# Patient Record
Sex: Female | Born: 1970
Health system: Southern US, Community
[De-identification: ages and names within clinical notes are randomized; demographics above are authoritative.]

## PROBLEM LIST (undated history)

## (undated) DIAGNOSIS — Z8616 Personal history of COVID-19: Secondary | ICD-10-CM

## (undated) DIAGNOSIS — D649 Anemia, unspecified: Secondary | ICD-10-CM

## (undated) DIAGNOSIS — E119 Type 2 diabetes mellitus without complications: Secondary | ICD-10-CM

## (undated) DIAGNOSIS — K5900 Constipation, unspecified: Secondary | ICD-10-CM

## (undated) DIAGNOSIS — K648 Other hemorrhoids: Secondary | ICD-10-CM

## (undated) DIAGNOSIS — E782 Mixed hyperlipidemia: Secondary | ICD-10-CM

## (undated) DIAGNOSIS — I10 Essential (primary) hypertension: Secondary | ICD-10-CM

## (undated) DIAGNOSIS — Z5189 Encounter for other specified aftercare: Secondary | ICD-10-CM

## (undated) DIAGNOSIS — R5383 Other fatigue: Secondary | ICD-10-CM

## (undated) DIAGNOSIS — K219 Gastro-esophageal reflux disease without esophagitis: Secondary | ICD-10-CM

## (undated) HISTORY — DX: Type 2 diabetes mellitus without complications: E11.9

## (undated) HISTORY — DX: Encounter for other specified aftercare: Z51.89

## (undated) HISTORY — DX: Essential (primary) hypertension: I10

## (undated) HISTORY — PX: COLONOSCOPY: SHX174

---

## 2002-09-19 ENCOUNTER — Encounter (INDEPENDENT_AMBULATORY_CARE_PROVIDER_SITE_OTHER): Payer: Self-pay | Admitting: *Deleted

## 2002-09-19 ENCOUNTER — Encounter: Admission: RE | Admit: 2002-09-19 | Discharge: 2002-09-19 | Payer: Self-pay | Admitting: Internal Medicine

## 2002-09-19 ENCOUNTER — Encounter: Payer: Self-pay | Admitting: Internal Medicine

## 2004-01-15 ENCOUNTER — Inpatient Hospital Stay (HOSPITAL_COMMUNITY): Admission: AD | Admit: 2004-01-15 | Discharge: 2004-01-15 | Payer: Self-pay | Admitting: Obstetrics

## 2004-01-16 ENCOUNTER — Inpatient Hospital Stay (HOSPITAL_COMMUNITY): Admission: AD | Admit: 2004-01-16 | Discharge: 2004-01-19 | Payer: Self-pay | Admitting: Obstetrics

## 2006-10-20 ENCOUNTER — Encounter: Admission: RE | Admit: 2006-10-20 | Discharge: 2006-10-20 | Payer: Self-pay | Admitting: Internal Medicine

## 2007-09-08 ENCOUNTER — Emergency Department (HOSPITAL_COMMUNITY): Admission: EM | Admit: 2007-09-08 | Discharge: 2007-09-08 | Payer: Self-pay | Admitting: Emergency Medicine

## 2007-10-01 ENCOUNTER — Encounter: Admission: RE | Admit: 2007-10-01 | Discharge: 2007-10-01 | Payer: Self-pay | Admitting: Internal Medicine

## 2007-10-01 ENCOUNTER — Encounter (INDEPENDENT_AMBULATORY_CARE_PROVIDER_SITE_OTHER): Payer: Self-pay | Admitting: *Deleted

## 2010-05-25 ENCOUNTER — Encounter (INDEPENDENT_AMBULATORY_CARE_PROVIDER_SITE_OTHER): Payer: Self-pay | Admitting: *Deleted

## 2010-05-27 ENCOUNTER — Telehealth: Payer: Self-pay | Admitting: Gastroenterology

## 2010-05-28 ENCOUNTER — Ambulatory Visit: Payer: Self-pay | Admitting: Gastroenterology

## 2010-05-28 ENCOUNTER — Encounter (INDEPENDENT_AMBULATORY_CARE_PROVIDER_SITE_OTHER): Payer: Self-pay | Admitting: *Deleted

## 2010-05-28 DIAGNOSIS — K5900 Constipation, unspecified: Secondary | ICD-10-CM | POA: Insufficient documentation

## 2010-05-28 DIAGNOSIS — K625 Hemorrhage of anus and rectum: Secondary | ICD-10-CM | POA: Insufficient documentation

## 2010-05-28 DIAGNOSIS — K219 Gastro-esophageal reflux disease without esophagitis: Secondary | ICD-10-CM | POA: Insufficient documentation

## 2010-05-28 HISTORY — DX: Hemorrhage of anus and rectum: K62.5

## 2010-05-28 LAB — CONVERTED CEMR LAB
ALT: 24 units/L (ref 0–35)
AST: 23 units/L (ref 0–37)
Albumin: 4.3 g/dL (ref 3.5–5.2)
Alkaline Phosphatase: 98 units/L (ref 39–117)
BUN: 9 mg/dL (ref 6–23)
Basophils Absolute: 0 10*3/uL (ref 0.0–0.1)
Basophils Relative: 0.5 % (ref 0.0–3.0)
Bilirubin, Direct: 0.1 mg/dL (ref 0.0–0.3)
CO2: 32 meq/L (ref 19–32)
Calcium: 9.8 mg/dL (ref 8.4–10.5)
Chloride: 102 meq/L (ref 96–112)
Creatinine, Ser: 0.7 mg/dL (ref 0.4–1.2)
Eosinophils Absolute: 0.1 10*3/uL (ref 0.0–0.7)
Eosinophils Relative: 1.5 % (ref 0.0–5.0)
Ferritin: 87 ng/mL (ref 10.0–291.0)
Folate: 8.7 ng/mL
GFR calc non Af Amer: 100.4 mL/min (ref >60)
Glucose, Bld: 83 mg/dL (ref 70–99)
HCT: 39.3 % (ref 36.0–46.0)
Hemoglobin: 13.2 g/dL (ref 12.0–15.0)
Iron: 82 ug/dL (ref 42–145)
Lymphocytes Relative: 30.1 % (ref 12.0–46.0)
Lymphs Abs: 2.1 10*3/uL (ref 0.7–4.0)
MCHC: 33.6 g/dL (ref 30.0–36.0)
MCV: 78.3 fL (ref 78.0–100.0)
Monocytes Absolute: 0.6 10*3/uL (ref 0.1–1.0)
Monocytes Relative: 8.4 % (ref 3.0–12.0)
Neutro Abs: 4.1 10*3/uL (ref 1.4–7.7)
Neutrophils Relative %: 59.5 % (ref 43.0–77.0)
Platelets: 302 10*3/uL (ref 150.0–400.0)
Potassium: 4.5 meq/L (ref 3.5–5.1)
RBC: 5.03 M/uL (ref 3.87–5.11)
RDW: 12.7 % (ref 11.5–14.6)
Saturation Ratios: 21.6 % (ref 20.0–50.0)
Sodium: 141 meq/L (ref 135–145)
TSH: 1.17 microintl units/mL (ref 0.35–5.50)
Total Bilirubin: 0.3 mg/dL (ref 0.3–1.2)
Total Protein: 8.1 g/dL (ref 6.0–8.3)
Transferrin: 271.1 mg/dL (ref 212.0–360.0)
Vitamin B-12: 418 pg/mL (ref 211–911)
WBC: 6.9 10*3/uL (ref 4.5–10.5)

## 2010-06-03 ENCOUNTER — Telehealth (INDEPENDENT_AMBULATORY_CARE_PROVIDER_SITE_OTHER): Payer: Self-pay | Admitting: *Deleted

## 2010-06-04 ENCOUNTER — Ambulatory Visit: Payer: Self-pay | Admitting: Gastroenterology

## 2010-06-07 ENCOUNTER — Telehealth: Payer: Self-pay | Admitting: Gastroenterology

## 2010-08-08 ENCOUNTER — Encounter: Payer: Self-pay | Admitting: Obstetrics

## 2010-08-17 NOTE — Progress Notes (Signed)
Summary: triage  Phone Note Call from Patient Call back at Home Phone [phone]   Caller: Patient Call For: Dr. Jarold Motto Reason for Call: Talk to Nurse Details for Reason: triage Summary of Call: Pt. called and is still bleeding.  Please call and advise. Initial call taken by: Schuyler Amor,  June 07, 2010 3:09 PM  Follow-up for Phone Call        Patient  says that his wife is still having rectal bleeding.  She is advised to continue analpram and sitz bath two times a day.  She will call back for continued problems after 2 weeks. Follow-up by: Darcey Nora RN, CGRN,  June 07, 2010 3:33 PM  Additional Follow-up for Phone Call Additional follow up Details #1::        surgical referral probably needed per large bleeding external hemorrhoids... Additional Follow-up by: Mardella Layman MD FACG,  June 07, 2010 3:54 PM  New Problems: HEMORRHOIDS (ICD-455.6)   Additional Follow-up for Phone Call Additional follow up Details #2::    NOT AN EMERGENCY...WOULD SCHEDULE ELECTIVE APPT...DRPDo you want me to refer now or wait to see how she responds to tx? Follow-up by: Darcey Nora RN, CGRN,  June 07, 2010 4:06 PM  Additional Follow-up for Phone Call Additional follow up Details #3:: Details for Additional Follow-up Action Taken: SCHEDULE ELECTIVE APPT////I DOUBT SHE WILL STOP.Marland KitchenMarland KitchenDEMANDING PTS. Additional Follow-up by: Mardella Layman MD FACG,  June 07, 2010 4:12 PM  New Problems: HEMORRHOIDS (ICD-455.6)  Patient's husband notified of surgical referral to [facility] Dr Luisa Hart 06/22/10 11:00. Darcey Nora RN, [facility]  June 07, 2010 4:50 PM

## 2010-08-17 NOTE — Assessment & Plan Note (Signed)
Summary: rectal bleeding...as.   History of Present Illness Visit Type: Initial Visit Primary GI MD: Sheryn Bison MD FACP FAGA Primary Shuntel Fishburn: Rowland Lathe, PA Chief Complaint: rectal bleeding  x 1 week, very heavy  History of Present Illness:   40 year old [place] female patient of Dr. Glendale Chard referred for a one-week history of increasing constipation, abdominal gas and bloating, and bright red blood per rectum. Apparently patient been constipated for several years. Her history is supplied by a very intelligent interpreter supplied by [employer].  History is difficult, but apparently patient has not had previous endoscopic exams. She has some minor reflux symptoms and recently has been placed on Dexilant 60 mg a day, p.r.n. MiraLax, and Anusol-HC suppositories. Currently, she denies chest pain, dysphagia, or any specific hepatobiliary Sherral Hammers or any family history of colon cancer  or polyps.   GI Review of Systems    Reports abdominal pain.     Location of  Abdominal pain: generalized.    Denies acid reflux, belching, bloating, chest pain, dysphagia with liquids, dysphagia with solids, heartburn, loss of appetite, nausea, vomiting, vomiting blood, weight loss, and  weight gain.      Reports constipation and  rectal bleeding.     Denies anal fissure, black tarry stools, change in bowel habit, diarrhea, diverticulosis, fecal incontinence, heme positive stool, hemorrhoids, irritable bowel syndrome, jaundice, light color stool, liver problems, and  rectal pain. Preventive Screening-Counseling & Management  Alcohol-Tobacco     Smoking Status: never      Drug Use:  no.      Current Medications (verified): 1)  Miralax  Powd (Polyethylene Glycol 3350) .... As Directed 2)  Anusol-Hc 25 Mg Supp (Hydrocortisone Acetate) .... As Needed 3)  Dexilant 60 Mg Cpdr (Dexlansoprazole) .... Take 1 Capsule By Mouth Once Daily 4)  Loratadine 10 Mg Tabs (Loratadine) .... Take 1 Tablet By Mouth  Once Daily  Allergies (verified): No Known Drug Allergies  Past History:  Past medical, surgical, family and social histories (including risk factors) reviewed for relevance to current acute and chronic problems.  Past Medical History: Arthritis Esophageal Stricture GI Bleed  Past Surgical History: Unremarkable  Family History: Reviewed history and no changes required. No FH of Colon Cancer:  Social History: Reviewed history and no changes required. Patient has never smoked.  Alcohol Use - no Illicit Drug Use - no Smoking Status:  never Drug Use:  no  Review of Systems       The patient complains of arthritis/joint pain, confusion, itching, and sore throat.  The patient denies allergy/sinus, anemia, anxiety-new, back pain, blood in urine, breast changes/lumps, change in vision, cough, coughing up blood, depression-new, fainting, fatigue, fever, headaches-new, hearing problems, heart murmur, heart rhythm changes, menstrual pain, muscle pains/cramps, night sweats, nosebleeds, pregnancy symptoms, shortness of breath, skin rash, sleeping problems, swelling of feet/legs, swollen lymph glands, thirst - excessive , urination - excessive , urination changes/pain, urine leakage, vision changes, and voice change.    Vital Signs:  Patient profile:   40 year old female Height:      62 inches Weight:      136.38 pounds BMI:     25.03 Pulse rate:   64 / minute Pulse rhythm:   regular BP sitting:   152 / 90  (right arm) Cuff size:   regular  Vitals Entered By: June McMurray CMA Duncan Dull) (May 28, 2010 8:30 AM)  Physical Exam  General:  Well developed, well nourished, no acute distress.healthy appearing.  Head:  Normocephalic and atraumatic. Eyes:  PERRLA, no icterus. Lungs:  Clear throughout to auscultation. Heart:  Regular rate and rhythm; no murmurs, rubs,  or bruits. Abdomen:  Soft, nontender and nondistended. No masses, hepatosplenomegaly or hernias noted. Normal bowel  sounds.No distention, masses or tenderness or abnormal bowel sounds noted. Rectal:  inspection of the rectum is unremarkable as is digital rectal exam. Rectal tone appears normal. There is bloody solid stool noted with straining. Extremities:  No clubbing, cyanosis, edema or deformities noted. Neurologic:  Alert and  oriented x4;  grossly normal neurologically. Skin:  Intact without significant lesions or rashes. Psych:  Alert and cooperative. Normal mood and affect.   Impression & Recommendations:  Problem # 1:  RECTAL BLEEDING (ICD-569.3) Assessment Unchanged Probable colonic polyposis or carcinoma versus stercoral ulceration associated with severe constipation. I have ordered appropriate labs and schedule her for colonoscopy exam. We will try to coordinate this with her family and with help of her interpreter. Orders: Colonoscopy (Colon) TLB-CBC Platelet - w/Differential (85025-CBCD) TLB-BMP (Basic Metabolic Panel-BMET) (80048-METABOL) TLB-Hepatic/Liver Function Pnl (80076-HEPATIC) TLB-TSH (Thyroid Stimulating Hormone) (84443-TSH) TLB-B12, Serum-Total ONLY ([address]-U04) TLB-Ferritin (82728-FER) TLB-Folic Acid (Folate) (82746-FOL) TLB-IBC Pnl (Iron/FE;Transferrin) (83550-IBC)  Problem # 2:  CONSTIPATION (ICD-564.00) Assessment: Deteriorated Daily Anzemet MiraLax at bedtime with followup Amitiza 8 micrograms twice a day.  Problem # 3:  GERD (ICD-530.81) Assessment: Improved I cannot elicit any significant upper GI symptoms at this time, and we'll empirically have her continue her Dexilant 60 mg a day.  Patient Instructions: 1)  Copy sent to : Rowland Lathe, PA 2)  Please go to the basement today for your labs.  3)  [facility] Patient Information Guide given to patient.  4)  Colonoscopy and Flexible Sigmoidoscopy brochure given.  5)  Your prescription(s) have been sent to you pharmacy.  6)  Your procedure has been scheduled for 06/04/2010, please follow the  seperate instructions.  7)  The medication list was reviewed and reconciled.  All changed / newly prescribed medications were explained.  A complete medication list was provided to the patient / caregiver. Prescriptions: MOVIPREP 100 GM  SOLR (PEG-KCL-NACL-NASULF-NA ASC-C) As per prep instructions.  #1 x 0   Entered by:   Harlow Mares CMA (AAMA)   Authorized by:   Mardella Layman MD [facility]   Signed by:   Harlow Mares CMA (AAMA) on 05/28/2010   Method used:   [facility] [address] [phone]* (retail)       [address]       [place], [place]  [address]       Ph: [phone] or [phone]       Fax: [phone]   RxID:   [id number]

## 2010-08-17 NOTE — Progress Notes (Signed)
Summary: Triage  Phone Note Other Incoming Call back at [phone]   Caller: Patient's Husband, Nia Details for Reason: triage Summary of Call: Pt. has appt. with Dr. Jarold Motto 12/13.  Husband called back today to see if she could be seen sooner as she is having a lot of bleeding.   Initial call taken by: Schuyler Amor,  May 27, 2010 2:16 PM  Follow-up for Phone Call        Left message for patient to call back Darcey Nora RN, [facility]  May 27, 2010 2:35 PM  I spoke with the patient's husband.  She is having rectal bleeding.  Wanted to move up her appointment.  Patient's husband is very difficult to understand English is very poor and broken.  I have rescheduled the patient for an appointment tomorrow with Dr Jarold Motto I have also arranged with Rashanda at [facility] for a translator patient speeks no English. Follow-up by: Darcey Nora RN, CGRN,  May 27, 2010 3:05 PM

## 2010-08-17 NOTE — Miscellaneous (Signed)
Summary: prescriptions  Clinical Lists Changes  Medications: Removed medication of MOVIPREP 100 GM  SOLR (PEG-KCL-NACL-NASULF-NA ASC-C) As per prep instructions. Added new medication of ANALPRAM-HC 1-2.5 % CREA (HYDROCORTISONE ACE-PRAMOXINE) apply to afftected area as needed - Signed Rx of AMITIZA 8 MCG CAPS (LUBIPROSTONE) take one by mouth two times a day;  #60 x 6;  Signed;  Entered by: Francee Piccolo CMA (AAMA);  Authorized by: Mardella Layman MD [facility];  Method used: [facility] [address] [phone]*, [address] [place], [place], [place]  [address], Ph: [phone] or [phone], Fax: [phone] Rx of ANALPRAM-HC 1-2.5 % CREA (HYDROCORTISONE ACE-PRAMOXINE) apply to afftected area as needed;  #30 g x 3;  Signed;  Entered by: Francee Piccolo CMA (AAMA);  Authorized by: Mardella Layman MD [facility];  Method used: Electronically to [facility] [address] [phone]*, [address], [place], [place], [place]  [address], Ph: [phone] or [phone], Fax: [phone]    Prescriptions: ANALPRAM-HC 1-2.5 % CREA (HYDROCORTISONE ACE-PRAMOXINE) apply to afftected area as needed  #30 g x 3   Entered by:   Francee Piccolo CMA (AAMA)   Authorized by:   Mardella Layman MD [facility]   Signed by:   Francee Piccolo CMA (AAMA) on 06/04/2010   Method used:   Electronically to        [facility] [address] [phone]* (retail)       [address]       [place], [place]  [address]       Ph: [phone] or [phone]       Fax: [phone]   RxID:   [id number] AMITIZA 8 MCG CAPS (LUBIPROSTONE) take one by mouth two times a day  #60 x 6   Entered by:   Francee Piccolo CMA (AAMA)   Authorized by:   Mardella Layman MD [facility]   Signed by:   Francee Piccolo CMA (AAMA) on 06/04/2010   Method used:   Electronically to        [facility] [address] [phone]* (retail)       [address]       [place], [place]   [address]       Ph: [phone] or [phone]       Fax: [phone]   RxID:   [id number]

## 2010-08-17 NOTE — Letter (Signed)
Summary: New Patient letter  [facility]  [address] [place], [place] [address]   Phone: [phone]  Fax: [phone]       05/25/2010 MRN: [id number]  [facility] [address] [place], [place]  [address]  Dear Lori Sullivan,  Welcome to the Gastroenterology Division at [facility].    You are scheduled to see Dr. Jarold Motto on 06/29/2010 at 2:00PM on the 3rd floor at [facility], [address]  We ask that you try to arrive at our office 15 minutes prior to your appointment time to allow for check-in.  We would like you to complete the enclosed self-administered evaluation form prior to your visit and bring it with you on the day of your appointment.  We will review it with you.  Also, please bring a complete list of all your medications or, if you prefer, bring the medication bottles and we will list them.  Please bring your insurance card so that we may make a copy of it.  If your insurance requires a referral to see a specialist, please bring your referral form from your primary care physician.  Co-payments are due at the time of your visit and may be paid by cash, check or credit card.     Your office visit will consist of a consult with your physician (includes a physical exam), any laboratory testing he/she may order, scheduling of any necessary diagnostic testing (e.g. x-ray, ultrasound, CT-scan), and scheduling of a procedure (e.g. Endoscopy, Colonoscopy) if required.  Please allow enough time on your schedule to allow for any/all of these possibilities.    If you cannot keep your appointment, please call [phone] to cancel or reschedule prior to your appointment date.  This allows [place] the opportunity to schedule an appointment for another patient in need of care.  If you do not cancel or reschedule by 5 p.m. the business day prior to your appointment date, you will be charged a $50.00 late cancellation/no-show fee.    Thank you for  choosing [facility] for your medical needs.  We appreciate the opportunity to care for you.  Please visit [place] at our website  to learn more about our practice.                     Sincerely,                                                             The Gastroenterology Division

## 2010-08-17 NOTE — Procedures (Signed)
Summary: Colonoscopy  Patient: [name] Note: All result statuses are Final unless otherwise noted.  Tests: (1) Colonoscopy (COL)   COL Colonoscopy           DONE     [facility]     [address]     [place], [place]  [address]           COLONOSCOPY PROCEDURE REPORT           PATIENT:  Lori Sullivan, Lori Sullivan  MR#:  [id number]     BIRTHDATE:  07-12-1971, 39 yrs. old  GENDER:  female     ENDOSCOPIST:  Vania Rea. Jarold Motto, MD, [facility]     REF. BY:     PROCEDURE DATE:  06/04/2010     PROCEDURE:  Diagnostic Colonoscopy     ASA CLASS:  Class II     INDICATIONS:  constipation, hematochezia     MEDICATIONS:   Fentanyl 100 mcg IV, Versed 10 mg IV           DESCRIPTION OF PROCEDURE:   After the risks benefits and     alternatives of the procedure were thoroughly explained, informed     consent was obtained.  Digital rectal exam was performed and     revealed no abnormalities.   The LB 180AL [id number] endoscope was     introduced through the anus and advanced to the cecum, which was     identified by both the appendix and ileocecal valve, without     limitations.  The quality of the prep was excellent, using     MoviPrep.  The instrument was then slowly withdrawn as the colon     was fully examined.     <<PROCEDUREIMAGES>>           FINDINGS:  no active bleeding or blood in c.  No polyps or cancers     were seen.  External hemorrhoids were found. LARGE AND BULGING     EXTERNAL HEMORRHOIDS.SEE PICTURES   Retroflexed views in the     rectum revealed it was not tolerated by the patient.    The scope     was then withdrawn from the patient and the procedure completed.           COMPLICATIONS:  None     ENDOSCOPIC IMPRESSION:     1) No active bleeding or blood in c     2) No polyps or cancers     3) External hemorrhoids     CHRONIC FUNCTIONAL CONSTIPATION AHD HEMORRHOIDAL BLEEDING.     RECOMMENDATIONS:     1,QHS MIRALAX 8OZS     2,AMITIZA BID     3,ANALPRAM CREAM BID     REPEAT  EXAM:  No           ______________________________     Vania Rea. Jarold Motto, MD, Clement[facility] Graham           CC:           n.     eSIGNED:   Vania Rea. Srihith Aquilino at 06/04/2010 03:40 PM           [name], [name], [id number]  Note: An exclamation mark (!) indicates a result that was not dispersed into the flowsheet. Document Creation Date: 06/04/2010 3:40 PM _______________________________________________________________________  (1) Order result status: Final Collection or observation date-time: 06/04/2010 15:29 Requested date-time:  Receipt date-time:  Reported date-time:  Referring Physician:   Ordering Physician: Sheryn Bison [phone]) Specimen Source:  Source: Launa Grill Order Number: [id number] Lab site:

## 2010-08-17 NOTE — Letter (Signed)
Summary: [facility] Instructions  [facility]  [address] [place], [place] [address]   Phone: [phone]  Fax: [phone]       Sania Rexene [facility]    April 04, 1971    MRN: [id number]        Procedure Day /Date: 06/04/2010 Friday     Arrival Time: 2:00pm     Procedure Time: 3:00pm     Location of Procedure:                    X  [facility] (4th Floor)                        PREPARATION FOR COLONOSCOPY WITH MOVIPREP   Starting 5 days prior to your procedure 05/30/2010 do not eat nuts, seeds, popcorn, corn, beans, peas,  salads, or any raw vegetables.  Do not take any fiber supplements (e.g. Metamucil, Citrucel, and Benefiber).  THE DAY BEFORE YOUR PROCEDURE         DATE: 06/03/2010  DAY: Thursday  1.  Drink clear liquids the entire day-NO SOLID FOOD  2.  Do not drink anything colored red or purple.  Avoid juices with pulp.  No orange juice.  3.  Drink at least 64 oz. (8 glasses) of fluid/clear liquids during the day to prevent dehydration and help the prep work efficiently.  CLEAR LIQUIDS INCLUDE: Water Jello Ice Popsicles Tea (sugar ok, no milk/cream) Powdered fruit flavored drinks Coffee (sugar ok, no milk/cream) Gatorade Juice: apple, white grape, white cranberry  Lemonade Clear bullion, consomm, broth Carbonated beverages (any kind) Strained chicken noodle soup Hard Candy                             4.  In the morning, mix first dose of MoviPrep solution:    Empty 1 Pouch A and 1 Pouch B into the disposable container    Add lukewarm drinking water to the top line of the container. Mix to dissolve    Refrigerate (mixed solution should be used within 24 hrs)  5.  Begin drinking the prep at 5:00 p.m. The MoviPrep container is divided by 4 marks.   Every 15 minutes drink the solution down to the next mark (approximately 8 oz) until the full liter is complete.   6.  Follow completed prep with 16 oz of clear liquid of your choice (Nothing  red or purple).  Continue to drink clear liquids until bedtime.  7.  Before going to bed, mix second dose of MoviPrep solution:    Empty 1 Pouch A and 1 Pouch B into the disposable container    Add lukewarm drinking water to the top line of the container. Mix to dissolve    Refrigerate  THE DAY OF YOUR PROCEDURE      DATE: 06/04/2010   DAY: Friday  Beginning at 10:00am (5 hours before procedure):         1. Every 15 minutes, drink the solution down to the next mark (approx 8 oz) until the full liter is complete.  2. Follow completed prep with 16 oz. of clear liquid of your choice.    3. You may drink clear liquids until 1:00pm (2 HOURS BEFORE PROCEDURE).   MEDICATION INSTRUCTIONS  Unless otherwise instructed, you should take regular prescription medications with a small sip of water   as early as possible the morning of your procedure.  OTHER INSTRUCTIONS  You will need a responsible adult at least 40 years of age to accompany you and drive you home.   This person must remain in the waiting room during your procedure.  Wear loose fitting clothing that is easily removed.  Leave jewelry and other valuables at home.  However, you may wish to bring a book to read or  an iPod/MP3 player to listen to music as you wait for your procedure to start.  Remove all body piercing jewelry and leave at home.  Total time from sign-in until discharge is approximately 2-3 hours.  You should go home directly after your procedure and rest.  You can resume normal activities the  day after your procedure.  The day of your procedure you should not:   Drive   Make legal decisions   Operate machinery   Drink alcohol   Return to work  You will receive specific instructions about eating, activities and medications before you leave.    The above instructions have been reviewed and explained to me by   _______________________    I fully understand and can verbalize these  instructions _____________________________ Date _________

## 2010-08-17 NOTE — Progress Notes (Signed)
Summary: Translator for [facility]  ---- Converted from flag ---- ---- 05/28/2010 10:11 AM, Harlow Mares CMA (AAMA) wrote: call Darl Householder [phone] to make sure he has a Nurse, learning disability. ------------------------------  Phone Note Outgoing Call   Call placed by: Harlow Mares CMA Duncan Dull),  June 03, 2010 9:29 AM Call placed to: Rashanda Summary of Call: called to make sure that the translator will be at the [facility] tomorrow arriving at 2pm for the patients procedure. Darl Householder states that it is arranged and he will be there.  Initial call taken by: Harlow Mares CMA (AAMA),  June 03, 2010 9:30 AM

## 2010-12-03 NOTE — Op Note (Signed)
NAME:  [facility], Makenzye                               ACCOUNT NO.:  [id number]   MEDICAL RECORD NO.:  [id number]                   PATIENT TYPE:  INP   LOCATION:  [address]                                 FACILITY:  [facility]   PHYSICIAN:  Kathreen Cosier, M.D.           DATE OF BIRTH:  1970/09/15   DATE OF PROCEDURE:  01/17/2004  DATE OF DISCHARGE:                                 OPERATIVE REPORT   The patient is a 40 year old [place] patient who did not understand  English, but her husband translated.  She was at term.  She is a gravida 1,  and she is [redacted] weeks pregnant.  She became fully dilated at 7:55 a.m.  She  had an epidural and she pushed well for 1-1/2 hours and then for the last 45  minutes complained of a pain between her shoulder blades whenever she  pushed.  She had become very restless when she had this pain and tried to  get off the bed.  The vertex was at a +3 station, so it was decided she  would either be delivered by a C-section of a vacuum.  A midline episiotomy  was performed and the vacuum applied at +3 station during one contraction.  There was no pop-off and the delivery of the vertex was achieved.  There  were two nuchal cords and the cord was cut prior to delivery of the  shoulders.  Apgar scores were 9 and 9, and the baby was delivered, female,  from the LOA position.  The placenta was delivered spontaneously intact and  then the episiotomy repaired with 2-0 Vicryl.                                               Kathreen Cosier, M.D.    BAM/MEDQ  D:  01/17/2004  T:  01/19/2004  Job:  [id number]

## 2010-12-27 ENCOUNTER — Ambulatory Visit (INDEPENDENT_AMBULATORY_CARE_PROVIDER_SITE_OTHER): Payer: 59

## 2010-12-27 ENCOUNTER — Inpatient Hospital Stay (INDEPENDENT_AMBULATORY_CARE_PROVIDER_SITE_OTHER)
Admission: RE | Admit: 2010-12-27 | Discharge: 2010-12-27 | Disposition: A | Discharge status: Home / Self Care | Payer: 59 | Source: Ambulatory Visit | Attending: Emergency Medicine | Admitting: Emergency Medicine

## 2010-12-27 DIAGNOSIS — R198 Other specified symptoms and signs involving the digestive system and abdomen: Secondary | ICD-10-CM

## 2010-12-27 LAB — POCT URINALYSIS DIP (DEVICE)
Bilirubin Urine: NEGATIVE
Glucose, UA: NEGATIVE mg/dL
Ketones, ur: NEGATIVE mg/dL
Leukocytes, UA: NEGATIVE
Nitrite: NEGATIVE
Protein, ur: NEGATIVE mg/dL
Specific Gravity, Urine: 1.02 (ref 1.005–1.030)
Urobilinogen, UA: 0.2 mg/dL (ref 0.0–1.0)
pH: 6.5 (ref 5.0–8.0)

## 2010-12-27 LAB — POCT PREGNANCY, URINE: Preg Test, Ur: NEGATIVE

## 2010-12-28 LAB — URINE CULTURE
Colony Count: NO GROWTH
Culture  Setup Time: 201206111718
Culture: NO GROWTH

## 2011-04-11 LAB — DIFFERENTIAL
Basophils Absolute: 0
Basophils Relative: 0
Eosinophils Absolute: 0.2
Eosinophils Relative: 4
Lymphocytes Relative: 35
Lymphs Abs: 2.4
Monocytes Absolute: 0.6
Monocytes Relative: 8
Neutro Abs: 3.8
Neutrophils Relative %: 53

## 2011-04-11 LAB — URINALYSIS, ROUTINE W REFLEX MICROSCOPIC
Bilirubin Urine: NEGATIVE
Glucose, UA: NEGATIVE
Ketones, ur: NEGATIVE
Nitrite: NEGATIVE
Protein, ur: 30 — AB
Specific Gravity, Urine: 1.023
Urobilinogen, UA: 0.2
pH: 6.5

## 2011-04-11 LAB — COMPREHENSIVE METABOLIC PANEL
ALT: 29
AST: 30
Albumin: 3.9
Alkaline Phosphatase: 100
BUN: 10
CO2: 27
Calcium: 9.3
Chloride: 104
Creatinine, Ser: 0.67
GFR calc Af Amer: >60
GFR calc non Af Amer: >60
Glucose, Bld: 99
Potassium: 4
Sodium: 140
Total Bilirubin: 0.5
Total Protein: 8.1

## 2011-04-11 LAB — LIPASE, BLOOD: Lipase: 26

## 2011-04-11 LAB — URINE MICROSCOPIC-ADD ON

## 2011-04-11 LAB — POCT PREGNANCY, URINE
Operator id: 133351
Preg Test, Ur: NEGATIVE

## 2011-04-11 LAB — URINE CULTURE: Colony Count: 30000

## 2011-04-11 LAB — CBC
HCT: 44.2
Hemoglobin: 14.5
MCHC: 32.8
MCV: 77.1 — ABNORMAL LOW
Platelets: 288
RBC: 5.73 — ABNORMAL HIGH
RDW: 12.3
WBC: 7

## 2011-06-15 ENCOUNTER — Other Ambulatory Visit (HOSPITAL_COMMUNITY): Payer: Self-pay | Admitting: Obstetrics

## 2011-06-15 DIAGNOSIS — Z1231 Encounter for screening mammogram for malignant neoplasm of breast: Secondary | ICD-10-CM

## 2011-06-15 DIAGNOSIS — N912 Amenorrhea, unspecified: Secondary | ICD-10-CM

## 2011-06-15 LAB — CYTOLOGY - PAP: Pap: NEGATIVE

## 2011-06-20 ENCOUNTER — Ambulatory Visit (HOSPITAL_COMMUNITY)
Admission: RE | Admit: 2011-06-20 | Discharge: 2011-06-20 | Disposition: A | Discharge status: Home / Self Care | Payer: 59 | Source: Ambulatory Visit | Attending: Obstetrics | Admitting: Obstetrics

## 2011-06-20 DIAGNOSIS — N912 Amenorrhea, unspecified: Secondary | ICD-10-CM | POA: Insufficient documentation

## 2011-07-21 ENCOUNTER — Ambulatory Visit (HOSPITAL_COMMUNITY): Payer: 59

## 2012-05-01 ENCOUNTER — Other Ambulatory Visit: Payer: Self-pay | Admitting: Internal Medicine

## 2012-05-01 DIAGNOSIS — Z1231 Encounter for screening mammogram for malignant neoplasm of breast: Secondary | ICD-10-CM

## 2012-06-11 ENCOUNTER — Ambulatory Visit
Admission: RE | Admit: 2012-06-11 | Discharge: 2012-06-11 | Disposition: A | Discharge status: Home / Self Care | Payer: 59 | Source: Ambulatory Visit | Attending: Internal Medicine | Admitting: Internal Medicine

## 2012-06-11 DIAGNOSIS — Z1231 Encounter for screening mammogram for malignant neoplasm of breast: Secondary | ICD-10-CM

## 2013-02-20 ENCOUNTER — Encounter (INDEPENDENT_AMBULATORY_CARE_PROVIDER_SITE_OTHER): Payer: Self-pay | Admitting: General Surgery

## 2013-02-20 ENCOUNTER — Ambulatory Visit (INDEPENDENT_AMBULATORY_CARE_PROVIDER_SITE_OTHER): Payer: Commercial Managed Care - PPO | Admitting: General Surgery

## 2013-02-20 VITALS — BP 122/76 | HR 80 | Resp 14 | Ht 60.0 in | Wt 141.6 lb

## 2013-02-20 DIAGNOSIS — K644 Residual hemorrhoidal skin tags: Secondary | ICD-10-CM | POA: Insufficient documentation

## 2013-02-20 DIAGNOSIS — K648 Other hemorrhoids: Secondary | ICD-10-CM

## 2013-02-20 MED ORDER — HYDROCORTISONE ACETATE 25 MG RE SUPP: 25.0000 mg | Freq: Two times a day (BID) | RECTAL | Status: DC

## 2013-02-20 NOTE — Patient Instructions (Signed)
GETTING TO GOOD BOWEL HEALTH. Irregular bowel habits such as constipation and diarrhea can lead to many problems over time.  Having one soft bowel movement a day is the most important way to prevent further problems.  The anorectal canal is designed to handle stretching and feces to safely manage our ability to get rid of solid waste (feces, poop, stool) out of our body.  BUT, hard constipated stools can act like ripping concrete bricks and diarrhea can be a burning fire to this very sensitive area of our body, causing inflamed hemorrhoids, anal fissures, increasing risk is perirectal abscesses, abdominal pain/bloating, an making irritable bowel worse.     The goal: ONE SOFT BOWEL MOVEMENT A DAY!  To have soft, regular bowel movements:    Drink at least 8 tall glasses of water a day.     Take plenty of fiber.  Fiber is the undigested part of plant food that passes into the colon, acting s "natures broom" to encourage bowel motility and movement.  Fiber can absorb and hold large amounts of water. This results in a larger, bulkier stool, which is soft and easier to pass. Work gradually over several weeks up to 6 servings a day of fiber (25g a day even more if needed) in the form of: o Vegetables -- Root (potatoes, carrots, turnips), leafy green (lettuce, salad greens, celery, spinach), or cooked high residue (cabbage, broccoli, etc) o Fruit -- Fresh (unpeeled skin & pulp), Dried (prunes, apricots, cherries, etc ),  or stewed ( applesauce)  o Whole grain breads, pasta, etc (whole wheat)  o Bran cereals    Bulking Agents -- This type of water-retaining fiber generally is easily obtained each day by one of the following:  o Psyllium bran -- The psyllium plant is remarkable because its ground seeds can retain so much water. This product is available as Metamucil, Konsyl, Effersyllium, Per Diem Fiber, or the less expensive generic preparation in drug and health food stores. Although labeled a laxative, it really  is not a laxative.  o Methylcellulose -- This is another fiber derived from wood which also retains water. It is available as Citrucel. o Polyethylene Glycol - and "artificial" fiber commonly called Miralax or Glycolax.  It is helpful for people with gassy or bloated feelings with regular fiber o Flax Seed - a less gassy fiber than psyllium   No reading or other relaxing activity while on the toilet. If bowel movements take longer than 5 minutes, you are too constipated   AVOID CONSTIPATION.  High fiber and water intake usually takes care of this.  Sometimes a laxative is needed to stimulate more frequent bowel movements, but    Laxatives are not a good long-term solution as it can wear the colon out. o Osmotics (Milk of Magnesia, Fleets phosphosoda, Magnesium citrate, MiraLax, GoLytely) are safer than  o Stimulants (Senokot, Castor Oil, Dulcolax, Ex Lax)    o Do not take laxatives for more than 7days in a row.    IF SEVERELY CONSTIPATED, try a Bowel Retraining Program: o Do not use laxatives.  o Eat a diet high in roughage, such as bran cereals and leafy vegetables.  o Drink six (6) ounces of prune or apricot juice each morning.  o Eat two (2) large servings of stewed fruit each day.  o Take one (1) heaping tablespoon of a psyllium-based bulking agent twice a day. Use sugar-free sweetener when possible to avoid excessive calories.  o Eat a normal breakfast.  o   Set aside 15 minutes after breakfast to sit on the toilet, but do not strain to have a bowel movement.  o If you do not have a bowel movement by the third day, use an enema and repeat the above steps.   B?nh Tr?  (Hemorrhoids) Tr? l cc t?nh m?ch b? s?ng xung quanh tr?c trng ho?c h?u mn. C hai lo?i tr?:   Tr? trong. Lo?i ny xu?t hi?n trong cc t?nh m?ch ngay bn trong tr?c trng. Chng c th? ch?c xuyn qua bn ngoi v tr? nn b? kch thch v ?au.  Tr? ngoi. Lo?i ny xu?t hi?n trong cc t?nh m?ch bn ngoi h?u mn v c th?  c?m th?y s?ng ?au ho?c m?t c?c s?ng c?ng g?n h?u mn. NGUYN NHN   [place].  Bo ph.  To bn ho?c tiu ch?y.  C?ng th?ng ?? ??i ti?n.  Ng?i lu trong nh v? sinh.  Nng v?t n?ng ho?c ho?t ??ng khc khi?n b?n c?ng th?ng.  Giao h?p qua ???ng h?u mn. TRI?U CH?NG   ?au.  Ng?a ho?c t?y h?u mn.  Ch?y mu tr?c trng.  R r? phn.  S?ng h?u mn.  M?t ho?c nhi?u kh?i u xung quanh h?u mn. CH?N ?ON  Chuyn gia ch?m [place] y t? c th? ch?n ?on b?nh tr? b?ng cch khm tr?c quan. Cc ki?m tra v xt nghi?m khc c th? ???c th?c hi?n bao g?m:   Ki?m tra khu v?c tr?c trng b?ng tay ?eo g?ng (xt nghi?m tr?c trng k? thu?t s?).  Ki?m tra ?ng h?u mn b?ng cch s? d?ng m?t ?ng nh? (?ng knh ng?m).  Xt nghi?m mu n?u b?n b? m?t m?t l??ng mu ?ng k?.  Xt nghi?m ?? soi bn trong ??i trng (soi ??i trng sigma ho??c soi ru?t k?t). ?I?U TR?  H?u h?t b?nh tr? c th? ???c ?i?u tr? t?i nh. Tuy nhin, n?u cc tri?u ch?ng c v? khng kh h?n ho?c n?u b?n b? ch?y mu tr?c trng nhi?u, chuyn gia ch?m [place] y t? c th? th?c hi?n m?t th? t?c ?? gip lm cho tr? nh? h?n ho?c lo?i b? chng hon ton. Cc ph??ng php ?i?u tr? c th? bao g?m:   ??t m?t b?ng cao su t?i chn tr? ?? c?t ??t l?u thng (th?t ??ng m?ch b?ng b?ng cao su).  Tim ha ch?t ?? thu nh? tr? (x? ho).  S? d?ng cng c? ?? ??t tr? (li?u php nh sng h?ng ngo?i).  Ph?u thu?t lo?i b? tr? (th? thu?t c?t tr?).  K?p tr? ?? ch?n dng mu ??n cc m (k?p tr?). H??NG D?N CH?M [place] T?I NH   ?n th?c ph?m c ch?t x? nh? ng? c?c, ??u, cc lo?i h?t, tri cy v rau. Hy h?i bc s? v? vi?c dng cc s?n ph?m c b? sung ch?t x? (b? sung ch?t x?).  U?ng nhi?u n??c h?n. U?ng ?? n??c v dung d?ch ?? n??c ti?u trong ho?c c mu vng nh?t.  T?p th? d?c th??ng xuyn.  ?i toilet khi b?n c nhu c?u. Khng ch?.  Trnh c?ng th?ng ?? ?i ??i ti?n.  Gi? vng h?u mn kh v s?ch s?. S? d?ng gi?y v? sinh ??t ho?c kh?n gi?y ?m sau khi ??i  ti?n.  Thu?c kem v thu?c ??n c th? ???c s? d?ng ho?c p d?ng theo ch? d?n.  Ch? s? d?ng thu?c mua tr?c ti?p t?i hi?u thu?c ho?c thu?c k toa theo ch? d?n c?a chuyn gia ch?m [place] y  t?.  T?m ng?i ?m kho?ng 15-20 pht, 3-4 l?n m?t ngy ?? gi?m ?au v kh ch?u.  Ch??m ? l?nh vo ch? tr? n?u n nh?y c?m ?au v s?ng ln. Vi?c s? d?ng cc gi ch??m ? gi?a cc l?n t?m ng?i c th? h?u ch.  Cho ? bo vo ti nh?a.  ??t kh?n t?m gi?a da v ti.  ?? ? l?nh kho?ng 15-20 pht, 3-4 l?n m?t ngy.  Khng s? d?ng g?i hnh bnh rn ho?c ng?i trn nh v? sinh trong th?i gian di. ?i?u ny lm t?ng mu t?ng h?p v ?au. HY THAM V?N V?I CHUYN GIA Y T? N?U:   B?n b? ?au v s?ng gia t?ng khng ki?m sot ???c b?ng ?i?u tr? ho?c thu?c.  B?n b? ch?y mu khng ki?m sot ???c.  B?n g?p kh kh?n ho?c khng th? ?i ??i ti?n.  B?n b? ?au ho?c vim nhi?m bn ngoi khu v?c tr?Marland Kitchen ??M B?O B?N:   Hi?u cc h??ng d?n ny.  S? theo di tnh tr?ng c?a mnh.  S? yu c?u tr? gip ngay l?p t?c n?u b?n c?m th?y khng kh?e ho?c tnh tr?ng tr? nn t?i h?n. Document Released: 04/13/2005 Document Revised: 06/20/2012 [facility] Patient Information 2014 [place], [place].

## 2013-02-20 NOTE — Progress Notes (Signed)
Patient ID: Lori Sullivan, female   DOB: [birth year], 42 y.o.   MRN: [id number]  Chief Complaint  Patient presents with  . New Evaluation    eval hems    HPI Lori Sullivan is a 42 y.o. female.   HPI 43 year old non-English-speaking [place] female was brought in by her husband because she is complaining of bleeding from her bottom. He functioned as her Nurse, learning disability. He states that she has had a several year problem with hemorrhoids. Most recently she started having some bleeding per her rectum last week. She denies any pain with defecation. She denies any itching or burning. She does complain of some mild intermittent left lower abdominal pain. The blood is mainly on the toilet paper as well as on the stool. She denies any incontinence. He states that she drinks about one bottle of water a day. She does not take any supplemental fiber. She had a colonoscopy a few years ago. They deny any family history of colon or rectal cancer. She has a bowel movement at least every other day. Past Medical History  Diagnosis Date  . Blood transfusion without reported diagnosis     History reviewed. No pertinent past surgical history.  History reviewed. No pertinent family history.  Social History History  Substance Use Topics  . Smoking status: Never Smoker   . Smokeless tobacco: Never Used  . Alcohol Use: No    No Known Allergies  Current Outpatient Prescriptions  Medication Sig Dispense Refill  . hydrocortisone (ANUSOL-HC) 25 MG suppository Place 1 suppository (25 mg total) rectally 2 (two) times daily.  12 suppository  0   No current facility-administered medications for this visit.    Review of Systems Review of Systems  Constitutional: Negative for fever, chills and unexpected weight change.  HENT: Negative for hearing loss, congestion, sore throat, trouble swallowing and voice change.   Eyes: Negative for visual disturbance.  Respiratory: Negative for cough and wheezing.   Cardiovascular:  Negative for chest pain, palpitations and leg swelling.  Gastrointestinal: Negative for nausea, vomiting, abdominal pain, diarrhea, constipation, blood in stool, abdominal distention and anal bleeding.  Genitourinary: Negative for hematuria, vaginal bleeding and difficulty urinating.  Musculoskeletal: Negative for arthralgias.  Skin: Negative for rash and wound.  Neurological: Negative for seizures, syncope and headaches.  Hematological: Negative for adenopathy. Does not bruise/bleed easily.  Psychiatric/Behavioral: Negative for confusion.    Blood pressure 122/76, pulse 80, resp. rate 14, height 5' (1.524 m), weight 141 lb 9.6 oz (64.229 kg).  Physical Exam Physical Exam  Vitals reviewed. Constitutional: She is oriented to person, place, and time. She appears well-developed and well-nourished. No distress.  HENT:  Head: Normocephalic and atraumatic.  Right Ear: External ear normal.  Left Ear: External ear normal.  Eyes: Conjunctivae are normal. No scleral icterus.  Neck: Normal range of motion. Neck supple. No tracheal deviation present. No thyromegaly present.  Cardiovascular: Normal rate and normal heart sounds.   Pulmonary/Chest: Effort normal and breath sounds normal. No stridor. No respiratory distress. She has no wheezes.  Abdominal: Soft. She exhibits no distension. There is no tenderness. There is no rebound.  Musculoskeletal: She exhibits no edema and no tenderness.  Lymphadenopathy:    She has no cervical adenopathy.  Neurological: She is alert and oriented to person, place, and time. She exhibits normal muscle tone.  Skin: Skin is warm and dry. No rash noted. She is not diaphoretic. No erythema.  Psychiatric: She has a normal mood and affect. Her behavior  is normal.    Data Reviewed Colonoscopy 2011- Dr Eloise Harman - inflammed/enlarged external hemorrhoids  Assessment    Bleeding 3 column internal/external hemorrhoids     Plan    We discussed the etiology of  hemorrhoids. The patient was given educational material as well as diagrams. We discussed nonoperative and operative management of hemorrhoidal disease. They were given information in [place] as well as in [place]  We discussed the importance of having a daily soft bowel movement and avoiding constipation. We also discussed good bowel habits such as not reading in the bathroom, not straining, and drinking 6-8 glasses of water per day. We also discussed the importance of a high fiber diet. We discussed foods that were high in fiber as well as fiber supplements. We discussed the importance of trying to get 25-30 g of fiber per day in their diet. We discussed the need to start with a low dose of fiber and then gradually increasing their daily fiber dose over several weeks in order to avoid bloating and cramping.   PLAN: She does have some impressive inflamed internal hemorrhoids. However I'm concerned that if we went to the operating room currently she would have ongoing hemorrhoidal problems. I would like to correct her underlying bowel habits such as increasing her water intake as well as fiber intake with fiber supplementation Which I think will help decrease the size of her hemorrhoids. She was also given a prescription for Anusol. I want to bring her back in 6 weeks and see how she's doing. Her husband voice understanding. She may however require surgery eventually  Lori Sullivan. [name], MD, FACS General, Bariatric, & Minimally Invasive Surgery [facility], [place]          [facility] M 02/20/2013, 10:27 AM

## 2013-02-21 ENCOUNTER — Telehealth (INDEPENDENT_AMBULATORY_CARE_PROVIDER_SITE_OTHER): Payer: Self-pay

## 2013-02-21 NOTE — Telephone Encounter (Signed)
Pts husband called stating pt is bleeding more today than yesterday. I offered appt today with Dr [name] to have hems re-evaluated. He declined due to pt "has to go to work". I advised him that without seeing pt there is very little we can do over the phone. Our phone connection was then lost and line is busy when trying to return call.

## 2013-02-21 NOTE — Telephone Encounter (Signed)
Husband called back at this time to ask that Dr. [name] call in prescriptions for patient due to increased rectal bleeding.  Explained to husband that without patient being seen prescriptions can't just be called in.  Offered to make appt in urgent office this afternoon however he states patient has to work.  Explained again that if she is having this much bleeding she really needs to be reassessed and again offered urgent office appt which husband again refused.  Suggested sitz baths to the husband to have patient try.  Husband states he will call back if bleeding gets worse and will have patient come in for urgent office.

## 2013-04-18 ENCOUNTER — Encounter (INDEPENDENT_AMBULATORY_CARE_PROVIDER_SITE_OTHER): Payer: Commercial Managed Care - PPO | Admitting: General Surgery

## 2014-06-19 ENCOUNTER — Other Ambulatory Visit: Payer: Self-pay

## 2014-06-19 DIAGNOSIS — Z1231 Encounter for screening mammogram for malignant neoplasm of breast: Secondary | ICD-10-CM

## 2014-07-07 ENCOUNTER — Ambulatory Visit
Admission: RE | Admit: 2014-07-07 | Discharge: 2014-07-07 | Disposition: A | Discharge status: Home / Self Care | Payer: 59 | Source: Ambulatory Visit

## 2014-07-07 DIAGNOSIS — Z1231 Encounter for screening mammogram for malignant neoplasm of breast: Secondary | ICD-10-CM

## 2014-10-20 ENCOUNTER — Encounter: Payer: Self-pay | Admitting: Family Medicine

## 2014-10-20 ENCOUNTER — Other Ambulatory Visit: Payer: Self-pay | Admitting: Family Medicine

## 2014-10-20 ENCOUNTER — Ambulatory Visit (HOSPITAL_COMMUNITY)
Admission: RE | Admit: 2014-10-20 | Discharge: 2014-10-20 | Disposition: A | Discharge status: Home / Self Care | Payer: 59 | Source: Ambulatory Visit | Attending: Family Medicine | Admitting: Family Medicine

## 2014-10-20 ENCOUNTER — Ambulatory Visit (INDEPENDENT_AMBULATORY_CARE_PROVIDER_SITE_OTHER): Payer: 59 | Admitting: Family Medicine

## 2014-10-20 VITALS — BP 159/87 | HR 81 | Temp 97.5°F | Ht 60.0 in | Wt 144.4 lb

## 2014-10-20 DIAGNOSIS — M545 Low back pain, unspecified: Secondary | ICD-10-CM

## 2014-10-20 DIAGNOSIS — Z7189 Other specified counseling: Secondary | ICD-10-CM

## 2014-10-20 DIAGNOSIS — M25552 Pain in left hip: Secondary | ICD-10-CM | POA: Diagnosis not present

## 2014-10-20 DIAGNOSIS — Z7689 Persons encountering health services in other specified circumstances: Secondary | ICD-10-CM

## 2014-10-20 DIAGNOSIS — J309 Allergic rhinitis, unspecified: Secondary | ICD-10-CM

## 2014-10-20 DIAGNOSIS — M25551 Pain in right hip: Secondary | ICD-10-CM | POA: Insufficient documentation

## 2014-10-20 MED ORDER — NAPROXEN 500 MG PO TABS: 500.0000 mg | ORAL_TABLET | Freq: Two times a day (BID) | ORAL | Status: DC

## 2014-10-20 MED ORDER — CETIRIZINE HCL 10 MG PO TABS: 10.0000 mg | ORAL_TABLET | Freq: Every day | ORAL | Status: DC

## 2014-10-20 NOTE — Progress Notes (Signed)
Patient ID: Lori Sullivan, female   DOB: 08/26/1970, 44 y.o.   MRN: [id number]  [place] phone interpreter utilized for this visit. History is limited by difficulty communicating even with interpreter on phone.  HPI:  Patient presents today for a new patient appointment to establish general primary care, also to discuss:  Right hip pain: has had pain in bilat low back/hips for a few months. Has never had xrays done of hips. R worse than L. No leg weakness. Has taken ibuprofen without a lot of relief.   URI sx's: has had cough, runny nose recently. No objective fever but felt warm. Eating and drinking well.  Prior PCP [facility] Ramond [facility]ure [facility]. Those records are not available to me at this time.  ROS: See HPI  Past Medical Hx:  -HTN, used to take medicine for this, unclear how long ago  Past Surgical Hx:  -none  Family Hx: updated in Epic. No medical problems in family.  Social Hx: does not smoke, drink. Husband works at [employer].  Health Maintenance:  -never had pap -UTD on mammogram -UTD on flu shot -UTD on tetanus  PHYSICAL EXAM: BP 159/87 mmHg  Pulse 81  Temp(Src) 97.5 F (36.4 C) (Oral)  Ht 5' (1.524 m)  Wt 144 lb 6.4 oz (65.499 kg)  BMI 28.20 kg/m2 Gen: NAD, cooperative and pleasant HEENT: NCAT, TM's clear bilat, nares patent with some clear discharge, oropharynx clear and moist Heart: RRR no murmur Lungs: CTAB NWOB Back: nontender to palpation Neuro: full strength bilateral lower extremities  ASSESSMENT/PLAN:  1. Health maintenance:  -needs physical for pap at some point   2. URI sx's - suspect related to allergies. rx zyrtec.  3. Bilateral hip/low back pain - chronic. Suspect MSK etiology. Check bilateral hip xrays. rx naproxen. Get records from prior PCP. F/u in 4 weeks.  4. HTN - BP elevated today, want to get records before deciding medical therapy for this.   FOLLOW UP: F/u in 4 weeks for above issues & to review records from old PCP - records  request completed today.  [place]adaBrittany J. Pollie MeyerMcIntyre, MD [facility]

## 2014-10-20 NOTE — Patient Instructions (Addendum)
Getting xrays of hips Take naproxen twice a day instead of ibuprofen Start zyrtec for allergies Follow up in 1 month, we should have your records by then  Be well, Dr. Pollie MeyerMcIntyre

## 2014-10-23 ENCOUNTER — Encounter: Payer: Self-pay | Admitting: Family Medicine

## 2014-12-04 ENCOUNTER — Encounter: Payer: Self-pay | Admitting: Family Medicine

## 2014-12-04 ENCOUNTER — Ambulatory Visit: Payer: 59 | Admitting: Family Medicine

## 2014-12-04 ENCOUNTER — Ambulatory Visit (INDEPENDENT_AMBULATORY_CARE_PROVIDER_SITE_OTHER): Payer: 59 | Admitting: Family Medicine

## 2014-12-04 VITALS — BP 155/73 | HR 65 | Temp 97.8°F | Ht 62.0 in | Wt 142.4 lb

## 2014-12-04 DIAGNOSIS — M545 Low back pain, unspecified: Secondary | ICD-10-CM

## 2014-12-04 DIAGNOSIS — I10 Essential (primary) hypertension: Secondary | ICD-10-CM | POA: Diagnosis not present

## 2014-12-04 DIAGNOSIS — K5900 Constipation, unspecified: Secondary | ICD-10-CM | POA: Diagnosis not present

## 2014-12-04 DIAGNOSIS — R1032 Left lower quadrant pain: Secondary | ICD-10-CM

## 2014-12-04 LAB — COMPREHENSIVE METABOLIC PANEL
ALT: 18 U/L (ref 0–35)
AST: 25 U/L (ref 0–37)
Albumin: 4.4 g/dL (ref 3.5–5.2)
Alkaline Phosphatase: 89 U/L (ref 39–117)
BUN: 12 mg/dL (ref 6–23)
CO2: 27 mEq/L (ref 19–32)
Calcium: 9.8 mg/dL (ref 8.4–10.5)
Chloride: 99 mEq/L (ref 96–112)
Creat: 0.7 mg/dL (ref 0.50–1.10)
Glucose, Bld: 92 mg/dL (ref 70–99)
Potassium: 4.8 mEq/L (ref 3.5–5.3)
Sodium: 139 mEq/L (ref 135–145)
Total Bilirubin: 0.4 mg/dL (ref 0.2–1.2)
Total Protein: 8.4 g/dL — ABNORMAL HIGH (ref 6.0–8.3)

## 2014-12-04 LAB — LIPID PANEL
Cholesterol: 223 mg/dL — ABNORMAL HIGH (ref 0–200)
HDL: 33 mg/dL — ABNORMAL LOW (ref >=46)
LDL Cholesterol: 127 mg/dL — ABNORMAL HIGH (ref 0–99)
Total CHOL/HDL Ratio: 6.8 Ratio
Triglycerides: 313 mg/dL — ABNORMAL HIGH (ref <150)
VLDL: 63 mg/dL — ABNORMAL HIGH (ref 0–40)

## 2014-12-04 LAB — POCT URINE PREGNANCY: Preg Test, Ur: NEGATIVE

## 2014-12-04 MED ORDER — POLYETHYLENE GLYCOL 3350 17 GM/SCOOP PO POWD: 17.0000 g | Freq: Every day | ORAL | Status: DC

## 2014-12-04 NOTE — Patient Instructions (Signed)
Checking labwork: kidneys, liver, cholesterol  Referring to physical therapy  Start miralax daily  I will call you if your test results are not normal.  Otherwise, I will send you a letter.  If you do not hear from me with in 2 weeks please call our office.     Follow up with me in 1 month  Be well, Dr. Pollie MeyerMcIntyre

## 2014-12-04 NOTE — Progress Notes (Signed)
Patient ID: Lori Sullivan, female   DOB: 04/26/1971, 44 y.o.   MRN: [id number]  Phone interpreter utilized during this visit.  HPI:  F/u bilateral hip pain: got xrays last time, normal. Worse when she stands up. This has been ongoing pain for some time. Naproxen helped a little. Denies having fever, saddle anesthesia, lower extremity weakness, or problems with stooling or urination.   LLQ pain - eating normally. Dull ache. Has BM daily. Stool is hard. No bleeding or discharge. Sexually active with husband. Postmenopausal, no periods in several years.  HTN - have still not received records from old PCP's office. No recent labwork in our system.  ROS: See HPI.  PMFSH: hx gerd, HTN, constipation  PHYSICAL EXAM: BP 155/73 mmHg  Pulse 65  Temp(Src) 97.8 F (36.6 C) (Oral)  Ht 5\' 2"  (1.575 m)  Wt 142 lb 6.4 oz (64.592 kg)  BMI 26.04 kg/m2 Gen: NAD HEENT: NCAT Heart: RRR no murmur Lungs: CTAB NWOB Neuro: grossly nonfocal speech normal Abd: soft NTTP no masses or organomegaly. Ext: No appreciable lower extremity edema bilaterally, full strength bilat lower ext Back: paraspinous musculature mildly TTP  ASSESSMENT/PLAN:  Constipation Reporting LLQ pain with hard stool. abd exam benign today. Hx of constipation noted. Will empirically treat with miralax 17g daily for constipation. F/u in 39mo to eval for improvement.   Low back pain Seems MSK in nature. Hip xrays neg last time. Will refer to physical therapy for strengthening & stretching exercises. F/u in 1 mo.   Essential hypertension, benign BP still slightly high. No meds at present. No labs available or old PCP records despite filling out request last time. Will check labs CMET, lipids. Anticipate will start medication at next visit.    FOLLOW UP: F/u in 1 mo for above issues Refer to PT  G[place]adaBrittany J. Pollie MeyerMcIntyre, MD [facility]

## 2014-12-05 DIAGNOSIS — M545 Low back pain, unspecified: Secondary | ICD-10-CM | POA: Insufficient documentation

## 2014-12-05 DIAGNOSIS — I10 Essential (primary) hypertension: Secondary | ICD-10-CM | POA: Insufficient documentation

## 2014-12-05 NOTE — Assessment & Plan Note (Signed)
BP still slightly high. No meds at present. No labs available or old PCP records despite filling out request last time. Will check labs CMET, lipids. Anticipate will start medication at next visit.

## 2014-12-05 NOTE — Assessment & Plan Note (Signed)
Reporting LLQ pain with hard stool. abd exam benign today. Hx of constipation noted. Will empirically treat with miralax 17g daily for constipation. F/u in 79mo to eval for improvement.

## 2014-12-05 NOTE — Assessment & Plan Note (Signed)
Seems MSK in nature. Hip xrays neg last time. Will refer to physical therapy for strengthening & stretching exercises. F/u in 1 mo.

## 2014-12-12 ENCOUNTER — Encounter: Payer: Self-pay | Admitting: Family Medicine

## 2015-01-01 ENCOUNTER — Encounter: Payer: Self-pay | Admitting: Family Medicine

## 2015-01-01 ENCOUNTER — Ambulatory Visit (INDEPENDENT_AMBULATORY_CARE_PROVIDER_SITE_OTHER): Payer: 59 | Admitting: Family Medicine

## 2015-01-01 VITALS — BP 136/77 | HR 72 | Temp 97.5°F | Ht 62.0 in | Wt 142.0 lb

## 2015-01-01 DIAGNOSIS — I10 Essential (primary) hypertension: Secondary | ICD-10-CM | POA: Diagnosis not present

## 2015-01-01 DIAGNOSIS — E049 Nontoxic goiter, unspecified: Secondary | ICD-10-CM | POA: Diagnosis not present

## 2015-01-01 DIAGNOSIS — E010 Iodine-deficiency related diffuse (endemic) goiter: Secondary | ICD-10-CM

## 2015-01-01 DIAGNOSIS — M545 Low back pain: Secondary | ICD-10-CM | POA: Diagnosis not present

## 2015-01-01 DIAGNOSIS — K5900 Constipation, unspecified: Secondary | ICD-10-CM

## 2015-01-01 MED ORDER — CETIRIZINE HCL 10 MG PO TABS: 10.0000 mg | ORAL_TABLET | Freq: Every day | ORAL | Status: DC

## 2015-01-01 NOTE — Patient Instructions (Signed)
Physical therapy will call you Okay to continue ibuprofen 200mg  1-2 times daily  Continue miralax 17g daily for constipation  Blood pressure looks great  Checking thyroid labs Take zyrtec 10mg  daily  Follow up with me in 6 weeks, sooner if throat worsens or does not improve.  Be well, Dr. Pollie Meyer

## 2015-01-02 LAB — T3: T3, Total: 125.4 ng/dL (ref 80.0–204.0)

## 2015-01-02 LAB — TSH: TSH: 1.817 u[IU]/mL (ref 0.350–4.500)

## 2015-01-02 LAB — T4, FREE: Free T4: 1.01 ng/dL (ref 0.80–1.80)

## 2015-01-04 DIAGNOSIS — E010 Iodine-deficiency related diffuse (endemic) goiter: Secondary | ICD-10-CM | POA: Insufficient documentation

## 2015-01-04 NOTE — Assessment & Plan Note (Signed)
Gets relief from ibuprofen, counseled ok to continue at 200mg  1-2 times per day. Counseled on risks of prolonged NSAID use including gastric issues and renal injury. Reordered referral to physical therapy and updated pt's phone number in chart so she can actually be contacted to schedule this appointment.

## 2015-01-04 NOTE — Progress Notes (Signed)
Patient ID: Lori Sullivan, female   DOB: 06-May-1971, 44 y.o.   MRN: [id number]  Husband was utilized as interpreter during this visit, as unfortunately Elissa Lovett interpreter not available, and vietnamese phone interpreter could not understand patient's dialect.  HPI:  Low back pain: continues to have bilat low back pain/hip pain. Naproxen and tylenol have not helped. Ibuprofen has helped. Takes 200mg  BID of ibuprofen with relief. Did not receive call from physical therapy, upon chart review it appears her phone number had been entered wrong into demographics section. Updated phone # in EPIC [phone]).   Constipation: taking miralax daily with resultant good bowel movements. No further abd pain.  Sore throat: just during this office visit began having sore throat. No fevers. Just began today.  ROS: See HPI.  PMFSH: hx GERD, constipation, HTN  PHYSICAL EXAM: BP 136/77 mmHg  Pulse 72  Temp(Src) 97.5 F (36.4 C) (Oral)  Ht 5\' 2"  (1.575 m)  Wt 142 lb (64.411 kg)  BMI 25.97 kg/m2 Gen: NAD, pleasant, cooperative HEENT: NCAT. Oropharynx clear and moist without tonsillar exudates or lesions. No anterior cervical lymphadenopathy. Some mild thyromegaly on superior aspect of thyroid.  Heart: RRR no murmur Lungs: CTAB NWOB Neuro: grossly nonfocal speech appears normal Abd: soft NTTP Back: mild TTP bilateral low back musculature  ASSESSMENT/PLAN:  Constipation Improved. Continue daily miralax.  Low back pain Gets relief from ibuprofen, counseled ok to continue at 200mg  1-2 times per day. Counseled on risks of prolonged NSAID use including gastric issues and renal injury. Reordered referral to physical therapy and updated pt's phone number in chart so she can actually be contacted to schedule this appointment.  Essential hypertension, benign BP normal today. Hold off any pharmacotherapy at this time.  Thyromegaly Pt endorsing sore throat that just began during today's visit No  signs of bacterial infection, well appearing Some thyromegaly on exam so will check TSH, T3, free T4   FOLLOW UP: F/u in 6 weeks for back pain  [place] J. Pollie Meyer, MD [facility]

## 2015-01-04 NOTE — Assessment & Plan Note (Signed)
Pt endorsing sore throat that just began during today's visit No signs of bacterial infection, well appearing Some thyromegaly on exam so will check TSH, T3, free T4

## 2015-01-04 NOTE — Assessment & Plan Note (Signed)
Improved Continue daily miralax 

## 2015-01-04 NOTE — Assessment & Plan Note (Signed)
BP normal today. Hold off any pharmacotherapy at this time.

## 2015-01-05 ENCOUNTER — Ambulatory Visit: Payer: 59 | Admitting: Family Medicine

## 2015-01-05 ENCOUNTER — Encounter: Payer: Self-pay | Admitting: Family Medicine

## 2015-01-27 ENCOUNTER — Ambulatory Visit: Payer: 59 | Admitting: Physical Therapy

## 2015-08-03 DIAGNOSIS — M5430 Sciatica, unspecified side: Secondary | ICD-10-CM | POA: Diagnosis not present

## 2015-08-03 DIAGNOSIS — I10 Essential (primary) hypertension: Secondary | ICD-10-CM | POA: Diagnosis not present

## 2015-08-03 DIAGNOSIS — K210 Gastro-esophageal reflux disease with esophagitis: Secondary | ICD-10-CM | POA: Diagnosis not present

## 2015-08-03 DIAGNOSIS — E784 Other hyperlipidemia: Secondary | ICD-10-CM | POA: Diagnosis not present

## 2015-08-03 MED FILL — DICLOFENAC SOD EC 75 MG TAB: 75 | 30 days supply | Qty: 60 | Fill #0

## 2015-09-07 DIAGNOSIS — K210 Gastro-esophageal reflux disease with esophagitis: Secondary | ICD-10-CM | POA: Diagnosis not present

## 2015-09-07 DIAGNOSIS — Z1239 Encounter for other screening for malignant neoplasm of breast: Secondary | ICD-10-CM | POA: Diagnosis not present

## 2015-09-07 DIAGNOSIS — E784 Other hyperlipidemia: Secondary | ICD-10-CM | POA: Diagnosis not present

## 2015-09-07 DIAGNOSIS — I10 Essential (primary) hypertension: Secondary | ICD-10-CM | POA: Diagnosis not present

## 2015-09-07 DIAGNOSIS — M545 Low back pain: Secondary | ICD-10-CM | POA: Diagnosis not present

## 2015-09-07 MED FILL — AMLODIPINE BESYLATE 5 MG TA: 5 | 30 days supply | Qty: 30 | Fill #0

## 2015-09-07 MED FILL — SIMVASTATIN 20 MG TABLET: 20 | 30 days supply | Qty: 30 | Fill #0

## 2015-09-07 MED FILL — DICLOFENAC SOD EC 75 MG TAB: 75 | 30 days supply | Qty: 60 | Fill #0

## 2015-09-07 MED FILL — OMEPRAZOLE DR 20 MG CAPSULE: 20 | 30 days supply | Qty: 60 | Fill #0

## 2015-10-08 MED FILL — OMEPRAZOLE DR 20 MG CAPSULE: 20 | 30 days supply | Qty: 60 | Fill #1

## 2015-10-08 MED FILL — AMLODIPINE BESYLATE 5 MG TA: 5 | 30 days supply | Qty: 30 | Fill #1

## 2015-10-08 MED FILL — DICLOFENAC SOD EC 75 MG TAB: 75 | 30 days supply | Qty: 60 | Fill #1

## 2015-10-08 MED FILL — SIMVASTATIN 20 MG TABLET: 20 | 30 days supply | Qty: 30 | Fill #1

## 2015-10-26 DIAGNOSIS — Z131 Encounter for screening for diabetes mellitus: Secondary | ICD-10-CM | POA: Diagnosis not present

## 2015-10-26 DIAGNOSIS — E784 Other hyperlipidemia: Secondary | ICD-10-CM | POA: Diagnosis not present

## 2015-10-26 DIAGNOSIS — K210 Gastro-esophageal reflux disease with esophagitis: Secondary | ICD-10-CM | POA: Diagnosis not present

## 2015-10-26 DIAGNOSIS — M545 Low back pain: Secondary | ICD-10-CM | POA: Diagnosis not present

## 2015-10-26 DIAGNOSIS — I10 Essential (primary) hypertension: Secondary | ICD-10-CM | POA: Diagnosis not present

## 2015-10-26 DIAGNOSIS — Z1239 Encounter for other screening for malignant neoplasm of breast: Secondary | ICD-10-CM | POA: Diagnosis not present

## 2015-10-26 DIAGNOSIS — J302 Other seasonal allergic rhinitis: Secondary | ICD-10-CM | POA: Diagnosis not present

## 2015-11-03 DIAGNOSIS — M47816 Spondylosis without myelopathy or radiculopathy, lumbar region: Secondary | ICD-10-CM | POA: Diagnosis not present

## 2015-11-03 DIAGNOSIS — M545 Low back pain: Secondary | ICD-10-CM | POA: Diagnosis not present

## 2015-11-13 MED FILL — SIMVASTATIN 20 MG TABLET: 20 | 30 days supply | Qty: 30 | Fill #2

## 2015-11-13 MED FILL — DICLOFENAC SOD EC 75 MG TAB: 75 | 30 days supply | Qty: 60 | Fill #2

## 2015-11-13 MED FILL — OMEPRAZOLE DR 20 MG CAPSULE: 20 | 30 days supply | Qty: 60 | Fill #2

## 2015-11-13 MED FILL — AMLODIPINE BESYLATE 5 MG TA: 5 | 30 days supply | Qty: 30 | Fill #2

## 2015-12-07 DIAGNOSIS — K210 Gastro-esophageal reflux disease with esophagitis: Secondary | ICD-10-CM | POA: Diagnosis not present

## 2015-12-07 DIAGNOSIS — K644 Residual hemorrhoidal skin tags: Secondary | ICD-10-CM | POA: Diagnosis not present

## 2015-12-07 DIAGNOSIS — J302 Other seasonal allergic rhinitis: Secondary | ICD-10-CM | POA: Diagnosis not present

## 2015-12-07 DIAGNOSIS — I10 Essential (primary) hypertension: Secondary | ICD-10-CM | POA: Diagnosis not present

## 2015-12-07 DIAGNOSIS — K5900 Constipation, unspecified: Secondary | ICD-10-CM | POA: Diagnosis not present

## 2015-12-16 MED FILL — AMLODIPINE BESYLATE 5 MG TA: 5 | 90 days supply | Qty: 90 | Fill #3

## 2015-12-16 MED FILL — DICLOFENAC SOD EC 75 MG TAB: 75 | 30 days supply | Qty: 60 | Fill #0

## 2015-12-16 MED FILL — SIMVASTATIN 20 MG TABLET: 20 | 90 days supply | Qty: 90 | Fill #3

## 2015-12-18 MED FILL — OMEPRAZOLE DR 20 MG CAPSULE: 20 | 90 days supply | Qty: 180 | Fill #0

## 2015-12-21 MED FILL — LIDOCAINE 2% VISCOUS SOLN: 2 | 30 days supply | Qty: 100 | Fill #1

## 2016-01-25 DIAGNOSIS — I10 Essential (primary) hypertension: Secondary | ICD-10-CM | POA: Diagnosis not present

## 2016-01-25 DIAGNOSIS — E784 Other hyperlipidemia: Secondary | ICD-10-CM | POA: Diagnosis not present

## 2016-01-25 DIAGNOSIS — M545 Low back pain: Secondary | ICD-10-CM | POA: Diagnosis not present

## 2016-01-25 DIAGNOSIS — Z1239 Encounter for other screening for malignant neoplasm of breast: Secondary | ICD-10-CM | POA: Diagnosis not present

## 2016-01-25 DIAGNOSIS — K210 Gastro-esophageal reflux disease with esophagitis: Secondary | ICD-10-CM | POA: Diagnosis not present

## 2016-01-25 DIAGNOSIS — Z124 Encounter for screening for malignant neoplasm of cervix: Secondary | ICD-10-CM | POA: Diagnosis not present

## 2016-01-25 DIAGNOSIS — J302 Other seasonal allergic rhinitis: Secondary | ICD-10-CM | POA: Diagnosis not present

## 2016-01-25 MED FILL — DICLOFENAC SOD EC 75 MG TAB: 75 | 30 days supply | Qty: 60 | Fill #0

## 2016-01-25 MED FILL — tiZANidine HCL 4 MG TABS: 4 | 30 days supply | Qty: 60 | Fill #0

## 2016-01-25 MED FILL — CLEAR-ATADINE 10 MG TABLET: 10 | 90 days supply | Qty: 90 | Fill #0

## 2016-03-01 MED FILL — SIMVASTATIN 20 MG TABLET: 20 | 30 days supply | Qty: 30 | Fill #0

## 2016-03-01 MED FILL — OMEPRAZOLE DR 20 MG CAPSULE: 20 | 30 days supply | Qty: 60 | Fill #0

## 2016-03-01 MED FILL — AMLODIPINE BESYLATE 5 MG TA: 5 | 30 days supply | Qty: 30 | Fill #0

## 2016-03-01 MED FILL — DICLOFENAC SOD EC 75 MG TAB: 75 | 30 days supply | Qty: 60 | Fill #1

## 2016-03-04 ENCOUNTER — Encounter: Payer: Self-pay | Admitting: *Deleted

## 2016-03-14 ENCOUNTER — Ambulatory Visit: Payer: 59 | Admitting: Obstetrics & Gynecology

## 2016-03-31 ENCOUNTER — Ambulatory Visit (INDEPENDENT_AMBULATORY_CARE_PROVIDER_SITE_OTHER): Payer: 59 | Admitting: Obstetrics and Gynecology

## 2016-03-31 ENCOUNTER — Encounter: Payer: Self-pay | Admitting: Obstetrics and Gynecology

## 2016-03-31 VITALS — BP 163/88 | HR 70 | Temp 97.7°F | Wt 142.2 lb

## 2016-03-31 DIAGNOSIS — Z01419 Encounter for gynecological examination (general) (routine) without abnormal findings: Secondary | ICD-10-CM | POA: Diagnosis not present

## 2016-03-31 DIAGNOSIS — Z202 Contact with and (suspected) exposure to infections with a predominantly sexual mode of transmission: Secondary | ICD-10-CM | POA: Diagnosis not present

## 2016-03-31 NOTE — Progress Notes (Signed)
Subjective:     Lori Sullivan is a 45 y.o. female here for a routine exam.  Current complaints: She c/o low back pain and hemmoriods.  Personal health questionnaire reviewed: yes.   Gynecologic History No LMP recorded. Patient is not currently having periods (Reason: Other). Contraception: none Last Pap: unknown. Results were: unknown Last mammogram: last year. Results were: normal  Obstetric History OB History  Gravida Para Term Preterm AB Living  1 1 0 0 0 0  SAB TAB Ectopic Multiple Live Births  0 0 0 0 0    # Outcome Date GA Lbr Len/2nd Weight Sex Delivery Anes PTL Lv  1 Para                The following portions of the patient's history were reviewed and updated as appropriate: problem list.  Review of Systems Pertinent items noted in HPI and remainder of comprehensive ROS otherwise negative.    Objective:    BP (!) 163/88   Pulse 70   Temp 97.7 F (36.5 C) (Oral)   Wt 142 lb 3.2 oz (64.5 kg)   BMI 26.01 kg/m   General Appearance:    Alert, cooperative, no distress, appears stated age  Head:    Normocephalic, without obvious abnormality, atraumatic  Eyes:    PERRL, conjunctiva/corneas clear, EOM's intact, fundi    benign, both eyes  Ears:    Normal TM's and external ear canals, both ears  Nose:   Nares normal, septum midline, mucosa normal, no drainage    or sinus tenderness  Throat:   Lips, mucosa, and tongue normal; teeth and gums normal  Neck:   Supple, symmetrical, trachea midline, no adenopathy;    thyroid:  no enlargement/tenderness/nodules; no carotid   bruit or JVD  Back:     Symmetric, no curvature, ROM normal, no CVA tenderness  Lungs:     Clear to auscultation bilaterally, respirations unlabored  Chest Wall:    No tenderness or deformity   Heart:    Regular rate and rhythm, S1 and S2 normal, no murmur, rub   or gallop  Breast Exam:    No tenderness, masses, or nipple abnormality  Abdomen:     Soft, non-tender, bowel sounds active all four quadrants,     no masses, no organomegaly  Genitalia:    Normal female without lesion, discharge or tenderness  Rectal:    Normal tone, normal prostate, no masses or tenderness;   guaiac negative stool  Extremities:   Extremities normal, atraumatic, no cyanosis or edema  Pulses:   2+ and symmetric all extremities  Skin:   Skin color, texture, turgor normal, no rashes or lesions  Lymph nodes:   Cervical, supraclavicular, and axillary nodes normal  Neurologic:   CNII-XII intact, normal strength, sensation and reflexes    throughout       Assessment:    Healthy female exam.      STD exposure   Plan:    STD testing as per pt request. Pt instructed to follow up with PCP for LBP, and BP and hemorrhoids. She decline mammogram.

## 2016-04-02 LAB — GC/CHLAMYDIA PROBE AMP
Chlamydia trachomatis, NAA: NEGATIVE
Neisseria gonorrhoeae by PCR: NEGATIVE

## 2016-04-03 LAB — PAP IG W/ RFLX HPV ASCU: PAP Smear Comment: 0

## 2016-04-06 ENCOUNTER — Telehealth: Payer: Self-pay | Admitting: *Deleted

## 2016-04-06 ENCOUNTER — Encounter: Payer: Self-pay | Admitting: *Deleted

## 2016-04-06 NOTE — Telephone Encounter (Signed)
-----   Message from Hermina StaggersMichael L Ervin, MD sent at 04/04/2016  8:21 AM EDT ----- Please notify pt of negative pap and STD testing Thanks Casimiro NeedleMichael ----- Message ----- From: Interface, [facility] Lab Results In Sent: 04/02/2016   5:37 AM To: Hermina StaggersMichael L Ervin, MD

## 2016-04-06 NOTE — Telephone Encounter (Signed)
Unable to obtain phone interpreter for this patient. Will send letter.

## 2016-04-14 MED FILL — AMLODIPINE BESYLATE 5 MG TA: 5 | 30 days supply | Qty: 30 | Fill #1

## 2016-04-29 ENCOUNTER — Encounter (HOSPITAL_COMMUNITY): Payer: Self-pay | Admitting: Emergency Medicine

## 2016-04-29 ENCOUNTER — Emergency Department (HOSPITAL_COMMUNITY): Payer: 59

## 2016-04-29 DIAGNOSIS — Y999 Unspecified external cause status: Secondary | ICD-10-CM | POA: Diagnosis not present

## 2016-04-29 DIAGNOSIS — Y9389 Activity, other specified: Secondary | ICD-10-CM | POA: Diagnosis not present

## 2016-04-29 DIAGNOSIS — S199XXA Unspecified injury of neck, initial encounter: Secondary | ICD-10-CM | POA: Diagnosis present

## 2016-04-29 DIAGNOSIS — S161XXA Strain of muscle, fascia and tendon at neck level, initial encounter: Secondary | ICD-10-CM | POA: Diagnosis not present

## 2016-04-29 DIAGNOSIS — I10 Essential (primary) hypertension: Secondary | ICD-10-CM | POA: Diagnosis not present

## 2016-04-29 DIAGNOSIS — S0003XA Contusion of scalp, initial encounter: Secondary | ICD-10-CM | POA: Diagnosis not present

## 2016-04-29 DIAGNOSIS — W01198A Fall on same level from slipping, tripping and stumbling with subsequent striking against other object, initial encounter: Secondary | ICD-10-CM | POA: Diagnosis not present

## 2016-04-29 DIAGNOSIS — Y929 Unspecified place or not applicable: Secondary | ICD-10-CM | POA: Insufficient documentation

## 2016-04-29 DIAGNOSIS — R51 Headache: Secondary | ICD-10-CM | POA: Insufficient documentation

## 2016-04-29 NOTE — ED Triage Notes (Signed)
Pt. Lost her balance and fell this evening , hit her head against the ground with brief LOC , endorses occipital headache and left lateral neck pain . Alert and oriented / respirations unlabored .

## 2016-04-29 NOTE — ED Notes (Signed)
C- collar applied at triage . 

## 2016-04-30 ENCOUNTER — Emergency Department (HOSPITAL_COMMUNITY)
Admission: EM | Admit: 2016-04-30 | Discharge: 2016-04-30 | Disposition: A | Discharge status: Home / Self Care | Payer: 59 | Attending: Emergency Medicine | Admitting: Emergency Medicine

## 2016-04-30 DIAGNOSIS — R51 Headache: Secondary | ICD-10-CM | POA: Diagnosis not present

## 2016-04-30 DIAGNOSIS — S0003XA Contusion of scalp, initial encounter: Secondary | ICD-10-CM | POA: Diagnosis not present

## 2016-04-30 DIAGNOSIS — I10 Essential (primary) hypertension: Secondary | ICD-10-CM | POA: Diagnosis not present

## 2016-04-30 DIAGNOSIS — S161XXA Strain of muscle, fascia and tendon at neck level, initial encounter: Secondary | ICD-10-CM

## 2016-04-30 DIAGNOSIS — W19XXXA Unspecified fall, initial encounter: Secondary | ICD-10-CM

## 2016-04-30 DIAGNOSIS — R55 Syncope and collapse: Secondary | ICD-10-CM | POA: Diagnosis not present

## 2016-04-30 MED ORDER — IBUPROFEN 800 MG PO TABS
800.0000 mg | ORAL_TABLET | Freq: Once | ORAL | Status: AC
  Administered 2016-04-30: 800 mg via ORAL
  Filled 2016-04-30: qty 1

## 2016-04-30 NOTE — Discharge Instructions (Signed)
Take ibuprofen 600 mg every 6 hours as needed for pain.  Ice for 20 minutes every 2 hours while awake for the next 2 days.  Return to the emergency department for worsening headache, visual disturbances, or other new and concerning symptoms.

## 2016-04-30 NOTE — ED Provider Notes (Signed)
[facility]-EMER[facility]ENCY DEPT Provider Note   CSN: [id number] Arrival date & time: 04/29/16  2219   By signing my name below, I, Clarisse GougeXavier Herndon, attest that this documentation has been prepared under the direction and in the presence of Geoffery Lyonsouglas Bastion Bolger, MD. Electronically signed, Clarisse GougeXavier Herndon, ED Scribe. 04/30/16. 2:44 AM.   History    The history is provided by a relative and the patient. No language interpreter was used.   HPI Comments: Lori Sullivan is a 45 y.o. female who presents to the Emergency Department s/p a head injury earlier this evening. The pt reports sliding backward on a wet floor while working. Family members report associated swelling to posterior of the head, head pain and neck pain. Denies SOB, chest pain, fever, chills, LOC or any other pain. The pt is otherwise healthy. Per family, the pt has taken tylenol for her pain.  Past Medical History:  Diagnosis Date  . Blood transfusion without reported diagnosis   . Hypertension    Pt stated she is not sure what HTN meds she was previously taking because her husband takes care of that.    Patient Active Problem List   Diagnosis Date Noted  . Thyromegaly 01/04/2015  . Low back pain 12/05/2014  . Essential hypertension, benign 12/05/2014  . Internal and external bleeding hemorrhoids 02/20/2013  . GERD 05/28/2010  . Constipation 05/28/2010  . RECTAL BLEEDING 05/28/2010    History reviewed. No pertinent surgical history.  OB History    Gravida Para Term Preterm AB Living   1 1 0 0 0 0   SAB TAB Ectopic Multiple Live Births   0 0 0 0 0       Home Medications    Prior to Admission medications   Medication Sig Start Date End Date Taking? Authorizing Provider  cetirizine (ZYRTEC) 10 MG tablet Take 1 tablet (10 mg total) by mouth daily. Patient not taking: Reported on 03/31/2016 01/01/15   Latrelle DodrillBrittany J McIntyre, MD  polyethylene glycol powder (GLYCOLAX/MIRALAX) powder Take 17 g by mouth daily. Patient not taking: Reported on  03/31/2016 12/04/14   Latrelle DodrillBrittany J McIntyre, MD    Family History No family history on file.  Social History Social History  Substance Use Topics  . Smoking status: Never Smoker  . Smokeless tobacco: Never Used  . Alcohol use No     Allergies   Review of patient's allergies indicates no known allergies.   Review of Systems Review of Systems  Constitutional: Negative for chills and fever.  HENT:       Swelling to left posterior head  Respiratory: Negative for shortness of breath.   Cardiovascular: Negative for chest pain.  Musculoskeletal: Positive for neck pain.  Neurological: Positive for headaches. Negative for syncope.  All other systems reviewed and are negative.    Physical Exam Updated Vital Signs BP 145/75 (BP Location: Left Arm)   Pulse 77   Temp 97.5 F (36.4 C) (Oral)   Resp 16   Ht 5\' 4"  (1.626 m)   Wt 140 lb (63.5 kg)   SpO2 98%   BMI 24.03 kg/m   Physical Exam  Constitutional: She is oriented to person, place, and time. She appears well-developed and well-nourished.  HENT:  Head: Normocephalic.  There is a swollen area to the left occiput. No palpable defect of the skull.  Eyes: EOM are normal. Pupils are equal, round, and reactive to light.  Neck: Normal range of motion.  There is TTP of the soft tissues of  the upper cervical region. No bony tenderness or step-offs. Good range of motion without significant discomfort.  Pulmonary/Chest: Effort normal.  Abdominal: She exhibits no distension.  Musculoskeletal: Normal range of motion.  Neurological: She is alert and oriented to person, place, and time. No cranial nerve deficit. She exhibits normal muscle tone. Coordination normal.  Psychiatric: She has a normal mood and affect.  Nursing note and vitals reviewed.    ED Treatments / Results  DIAGNOSTIC STUDIES: Oxygen Saturation is 98% on RA, normal by my interpretation.    COORDINATION OF CARE: 2:44 AM Will order medication for headache.  Discussed treatment plan with pt at bedside and pt agreed to plan.    Labs (all labs ordered are listed, but only abnormal results are displayed) Labs Reviewed - No data to display  EKG  EKG Interpretation None       Radiology Ct Head Wo Contrast  Result Date: 04/30/2016 CLINICAL DATA:  45 y/o F; fall with brief loss of consciousness and headache. EXAM: CT HEAD WITHOUT CONTRAST CT CERVICAL SPINE WITHOUT CONTRAST TECHNIQUE: Multidetector CT imaging of the head and cervical spine was performed following the standard protocol without intravenous contrast. Multiplanar CT image reconstructions of the cervical spine were also generated. COMPARISON:  None. FINDINGS: CT HEAD FINDINGS Brain: No evidence of acute infarction, hemorrhage, hydrocephalus, extra-axial collection or mass lesion/mass effect. Vascular: No hyperdense vessel. Mild calcific atherosclerosis of cavernous internal carotid arteries. Skull: Left parietal scalp soft tissue thickening likely represents contusion. No displaced calvarial fracture. Sinuses/Orbits: Right posterior ethmoid partial opacification, otherwise normally aerated paranasal sinuses. Orbits are unremarkable. Other: None. CT CERVICAL SPINE FINDINGS Alignment: Positional straightening of cervical lordosis with mild reversal. Skull base and vertebrae: No acute fracture. No primary bone lesion or focal pathologic process. Soft tissues and spinal canal: No prevertebral fluid or swelling. No visible canal hematoma. Disc levels: Mild discogenic degenerative changes greatest at the C6-7 level with there is mild disc space narrowing and marginal osteophytes. No high-grade bony canal stenosis or foraminal narrowing. Productive changes of anterior C1-2 articulation. Upper chest: Negative. Other: Normal thyroid gland. No lymphadenopathy or discrete cervical mass identified on this noncontrast examination. Postinflammatory calcifications of palatini tonsils. Patent aerodigestive tract.  Asymmetric enlargement of the left laryngeal vestibule (series 301, image 60). IMPRESSION: 1. No acute intracranial abnormality is identified. Unremarkable CT of head for age. 2. No acute fracture or dislocation of the cervical spine. 3. Mild cervical spine degenerative changes. 4. Nonspecific asymmetric enlargement of left laryngeal vestibule, question vocal cord dysfunction. If clinically indicated consider direct visualization for further characterization. Electronically Signed   By: Mitzi Hansen M.D.   On: 04/30/2016 00:40   Ct Cervical Spine Wo Contrast  Result Date: 04/30/2016 CLINICAL DATA:  45 y/o F; fall with brief loss of consciousness and headache. EXAM: CT HEAD WITHOUT CONTRAST CT CERVICAL SPINE WITHOUT CONTRAST TECHNIQUE: Multidetector CT imaging of the head and cervical spine was performed following the standard protocol without intravenous contrast. Multiplanar CT image reconstructions of the cervical spine were also generated. COMPARISON:  None. FINDINGS: CT HEAD FINDINGS Brain: No evidence of acute infarction, hemorrhage, hydrocephalus, extra-axial collection or mass lesion/mass effect. Vascular: No hyperdense vessel. Mild calcific atherosclerosis of cavernous internal carotid arteries. Skull: Left parietal scalp soft tissue thickening likely represents contusion. No displaced calvarial fracture. Sinuses/Orbits: Right posterior ethmoid partial opacification, otherwise normally aerated paranasal sinuses. Orbits are unremarkable. Other: None. CT CERVICAL SPINE FINDINGS Alignment: Positional straightening of cervical lordosis with mild reversal. Skull base and  vertebrae: No acute fracture. No primary bone lesion or focal pathologic process. Soft tissues and spinal canal: No prevertebral fluid or swelling. No visible canal hematoma. Disc levels: Mild discogenic degenerative changes greatest at the C6-7 level with there is mild disc space narrowing and marginal osteophytes. No high-grade  bony canal stenosis or foraminal narrowing. Productive changes of anterior C1-2 articulation. Upper chest: Negative. Other: Normal thyroid gland. No lymphadenopathy or discrete cervical mass identified on this noncontrast examination. Postinflammatory calcifications of palatini tonsils. Patent aerodigestive tract. Asymmetric enlargement of the left laryngeal vestibule (series 301, image 60). IMPRESSION: 1. No acute intracranial abnormality is identified. Unremarkable CT of head for age. 2. No acute fracture or dislocation of the cervical spine. 3. Mild cervical spine degenerative changes. 4. Nonspecific asymmetric enlargement of left laryngeal vestibule, question vocal cord dysfunction. If clinically indicated consider direct visualization for further characterization. Electronically Signed   By: Mitzi Hansen M.D.   On: 04/30/2016 00:40    Procedures Procedures (including critical care time)  Medications Ordered in ED Medications - No data to display   Initial Impression / Assessment and Plan / ED Course  I have reviewed the triage vital signs and the nursing notes.  Pertinent labs & imaging results that were available during my care of the patient were reviewed by me and considered in my medical decision making (see chart for details).  Clinical Course    CT of the head and cervical spine are negative. This appears to be a strain/contusion. She will be discharged with when necessary follow-up.  Final Clinical Impressions(s) / ED Diagnoses   Final diagnoses:  None    New Prescriptions New Prescriptions   No medications on file    I personally performed the services described in this documentation, which was scribed in my presence. The recorded information has been reviewed and is accurate.        Geoffery Lyons, MD 04/30/16 0730

## 2016-04-30 NOTE — ED Notes (Signed)
Patient left at this time with all belongings. 

## 2016-05-03 MED FILL — SIMVASTATIN 20 MG TABLET: 20 | 30 days supply | Qty: 30 | Fill #1

## 2016-05-04 MED FILL — AMLODIPINE BESYLATE 5 MG TA: 5 | 90 days supply | Qty: 90 | Fill #2

## 2016-08-23 ENCOUNTER — Ambulatory Visit (INDEPENDENT_AMBULATORY_CARE_PROVIDER_SITE_OTHER): Payer: 59 | Admitting: Family Medicine

## 2016-08-23 ENCOUNTER — Encounter: Payer: Self-pay | Admitting: Family Medicine

## 2016-08-23 DIAGNOSIS — M79605 Pain in left leg: Secondary | ICD-10-CM | POA: Insufficient documentation

## 2016-08-23 DIAGNOSIS — M79604 Pain in right leg: Secondary | ICD-10-CM

## 2016-08-23 MED ORDER — MELOXICAM 7.5 MG PO TABS: 7.5000 mg | ORAL_TABLET | Freq: Every day | ORAL | 0 refills | Status: DC

## 2016-08-23 MED FILL — MELOXICAM 7.5 MG TABLET: 7.5 | 30 days supply | Qty: 30 | Fill #0

## 2016-08-23 NOTE — Progress Notes (Signed)
    Subjective:  Lori Sullivan is a 46 y.o. female who [facility] with a chief complaint of bilateral leg pain. History provided by the patient via friend - no phone interpretor available.   HPI:  Bilateral Leg Pain Symptoms started about 3 weeks ago with "flu-like" symptoms including cough, sneeze, subjective fevers. She also started having low back pain that radiated into her legs and feet bilaterally around this time. The respiratory symptoms improved, though the patient continued to have the pain. She has taken advil which helps with the pain. Pain is dull in nature. She has not noticed anything else that makes the pain better or worse. No weakness. No numbness. No bowel or bladder incontinence.   ROS: Per HPI  PMH: Smoking history reviewed.   Objective:  Physical Exam: BP 130/80   Pulse 82   Temp 97.5 F (36.4 C) (Oral)   Wt 145 lb (65.8 kg)   SpO2 98%   BMI 24.89 kg/m   Gen: NAD, resting comfortably CV: RRR with no murmurs appreciated Pulm: NWOB, CTAB with no crackles, wheezes, or rhonchi MSK:  -Back: No deformities. Nontender to palpation. FROM -RLE: No deformities. Nontender to palpation. FROM. Strength 5/5 at hip, knee, and ankle. Sensation to light touch intact. Patellar and Achilles reflex 2+. SLR positive. -LLE: No deformities. Nontender to palpation. FROM. Strength 5/5 at hip, knee, and ankle. Sensation to light touch intact. Patellar and Achilles reflex 2+. SLR positive. Skin: warm, dry Neuro: grossly normal, moves all extremities Psych: Normal affect and thought content  Assessment/Plan:  Bilateral leg pain Most consistent with sciatic nerve pain given distribution from hips into soles of feet and positive straight leg raise test bilaterally. No reg flag signs or symptoms. Will treat with a 2 week course of meloxicam. Discussed simple stretching exercises - told patient to stop if it made symptoms worse. Strict return precautions reviewed. Follow up in  3-4 weeks. If not improving, would consider imaging study, possible referral to PT, and possible addition of neuropathic pain agent such as gabapentin if not improving.   Katina Degreealeb M. Jimmey RalphParker, MD [facility] Resident PGY-3 08/23/2016 11:07 AM

## 2016-08-23 NOTE — Patient Instructions (Signed)
You do not have the flu.  You probably have sciatica.  Take the medication once a day.  Do the exercises.  Come back in 3-4 weeks.  Take care,  Dr Jimmey RalphParker

## 2016-08-23 NOTE — Assessment & Plan Note (Signed)
Most consistent with sciatic nerve pain given distribution from hips into soles of feet and positive straight leg raise test bilaterally. No reg flag signs or symptoms. Will treat with a 2 week course of meloxicam. Discussed simple stretching exercises - told patient to stop if it made symptoms worse. Strict return precautions reviewed. Follow up in 3-4 weeks. If not improving, would consider imaging study, possible referral to PT, and possible addition of neuropathic pain agent such as gabapentin if not improving.

## 2016-08-29 MED FILL — LIDOCAINE 2% VISCOUS SOLN: 2 | 8 days supply | Qty: 100 | Fill #0

## 2016-09-07 ENCOUNTER — Encounter: Payer: Self-pay | Admitting: Family Medicine

## 2016-09-07 ENCOUNTER — Ambulatory Visit (INDEPENDENT_AMBULATORY_CARE_PROVIDER_SITE_OTHER): Payer: 59 | Admitting: Family Medicine

## 2016-09-07 ENCOUNTER — Ambulatory Visit (HOSPITAL_COMMUNITY)
Admission: RE | Admit: 2016-09-07 | Discharge: 2016-09-07 | Disposition: A | Discharge status: Home / Self Care | Payer: 59 | Source: Ambulatory Visit | Attending: Family Medicine | Admitting: Family Medicine

## 2016-09-07 VITALS — BP 120/78 | HR 64 | Temp 98.4°F | Ht 64.0 in | Wt 143.8 lb

## 2016-09-07 DIAGNOSIS — M79604 Pain in right leg: Secondary | ICD-10-CM | POA: Insufficient documentation

## 2016-09-07 DIAGNOSIS — M5431 Sciatica, right side: Secondary | ICD-10-CM | POA: Insufficient documentation

## 2016-09-07 DIAGNOSIS — M79605 Pain in left leg: Secondary | ICD-10-CM | POA: Insufficient documentation

## 2016-09-07 DIAGNOSIS — M5432 Sciatica, left side: Secondary | ICD-10-CM | POA: Diagnosis not present

## 2016-09-07 DIAGNOSIS — M5137 Other intervertebral disc degeneration, lumbosacral region: Secondary | ICD-10-CM | POA: Diagnosis not present

## 2016-09-07 DIAGNOSIS — M5136 Other intervertebral disc degeneration, lumbar region: Secondary | ICD-10-CM | POA: Diagnosis not present

## 2016-09-07 MED ORDER — GABAPENTIN 300 MG PO CAPS
300.0000 mg | ORAL_CAPSULE | Freq: Every day | ORAL | 3 refills | Status: DC
Start: 2016-09-07 — End: 2016-09-28

## 2016-09-07 MED FILL — GABAPENTIN 300 MG CAPSULE: 300 | 90 days supply | Qty: 90 | Fill #0

## 2016-09-07 NOTE — Progress Notes (Signed)
    Subjective:  Lori Sullivan is a 46 y.o. female who [facility] with a chief complaint of leg pain. History proved by the patient via [place] interpretor.   HPI:  Bilateral Leg Pain Seen in clinic about 2 weeks for bilateral leg pain. At that time was diagnosed with bilateral lumbar radicular pain. She was started on meloxicam. Over the past couple of weeks, the pain has not changed. It is constant in nature and rates from her hips to the soles of her feet bilaterally. She has not noticed anything that makes the pain better or worse. No weakness. No numbness. No bowel or bladder incontinence. She does not think that the meloxicam has helped.   ROS: Per HPI  PMH: Smoking history reviewed.    Objective:  Physical Exam: BP 120/78   Pulse 64   Temp 98.4 F (36.9 C) (Oral)   Ht 5\' 4"  (1.626 m)   Wt 143 lb 12.8 oz (65.2 kg)   SpO2 99%   BMI 24.68 kg/m   Gen: NAD, resting comfortably MSK:  -Back: No deformities. Nontender to palpation. FROM -RLE: No deformities. Nontender to palpation. FROM. Strength 5/5 at hip, knee, and ankle. Sensation to light touch intact. Patellar and Achilles reflex 2+. SLR positive. -LLE: No deformities. Nontender to palpation. FROM. Strength 5/5 at hip, knee, and ankle. Sensation to light touch intact. Patellar and Achilles reflex 2+. SLR positive. Neuro: grossly normal, moves all extremities Psych: Normal affect and thought content  Assessment/Plan:  Bilateral Lumbar Radicular Pain No red flag signs or symptoms. Not improving with NSAIDs. Will check lumbar film, refer to PT and start patient on gabapentin 300mg  qhs. Return precautions reviewed. Follow up in 3-4 weeks. If not improving and plain film negative, would consider advanced imaging. Would increase the dose of gabapentin as tolerated.   Katina Degreealeb M. Jimmey RalphParker, MD [facility] Resident PGY-3 09/07/2016 10:52 AM

## 2016-09-07 NOTE — Patient Instructions (Signed)
We will check an xray of your back.  Start the gabapentin at night. If this does not make you sleepy, you can take up to 3 times a day.  We will send you to physical therapy.  Come back to see me in 3-4 weeks.  Take care,  Dr Jimmey RalphParker

## 2016-09-28 ENCOUNTER — Encounter: Payer: Self-pay | Admitting: Internal Medicine

## 2016-09-28 ENCOUNTER — Telehealth: Payer: Self-pay | Admitting: *Deleted

## 2016-09-28 ENCOUNTER — Ambulatory Visit (INDEPENDENT_AMBULATORY_CARE_PROVIDER_SITE_OTHER): Payer: 59 | Admitting: Internal Medicine

## 2016-09-28 VITALS — BP 143/80 | HR 70 | Temp 97.6°F | Ht 62.0 in | Wt 148.8 lb

## 2016-09-28 DIAGNOSIS — J302 Other seasonal allergic rhinitis: Secondary | ICD-10-CM

## 2016-09-28 DIAGNOSIS — G8929 Other chronic pain: Secondary | ICD-10-CM | POA: Diagnosis not present

## 2016-09-28 DIAGNOSIS — M545 Low back pain: Secondary | ICD-10-CM

## 2016-09-28 MED ORDER — CETIRIZINE HCL 10 MG PO TABS: 10.0000 mg | ORAL_TABLET | Freq: Every day | ORAL | 2 refills | Status: DC

## 2016-09-28 MED ORDER — GABAPENTIN 100 MG PO CAPS: 300.0000 mg | ORAL_CAPSULE | Freq: Three times a day (TID) | ORAL | 1 refills | Status: DC

## 2016-09-28 MED ORDER — GABAPENTIN 100 MG PO CAPS: 100.0000 mg | ORAL_CAPSULE | Freq: Three times a day (TID) | ORAL | 1 refills | Status: DC

## 2016-09-28 MED FILL — GABAPENTIN 100 MG CAPSULE: 100 | 30 days supply | Qty: 90 | Fill #0

## 2016-09-28 NOTE — Patient Instructions (Addendum)
For your hip pain:  - please take Gabapentin 1 tablet (100mg ) three times a day - Please do the exercises daily  For your scratchy throat:  - please take Zyrtec daily   Please follow up in 4 weeks.

## 2016-09-28 NOTE — Progress Notes (Signed)
[facility] Phone: [phone]   Date of Visit: 09/28/2016   HPI:  Lori Sullivan is a 46 y.o. female presenting to clinic today for same day appointment. PCP: Velora HecklerHaney,Alyssa, MD Concerns today include:  Bilateral Hip Pain and Low back pain:  - chronic issue per chart review - initially seen in clinic on 08/23/16 for low back pain with radiation to lower extremitiesbilaterally. Given Meloxicam which did not help. Then obtained x-ray and given Gabapentin and referred to PT.  - 09/07/16: x-ray lumbar spine with facet changes at L5-S1.  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 No lower extremity weakness or numbness. Denies urinary incontinence or retention. No bowel incontinence.  - reports that her hip pain radiates down her legs bilaterally - reports that Gabapentin that patient was started on helps a little. Denies drowsiness with this medication but reports that her head feels heavy in the morning since she has been taking the medication.  - reports that she only did the exercises given to her by another provider for one day.   Sore throat:  - reports of intermittent scratchy/itchy throat for the past 2-3 days - no rhinorrhea or nasal congestion - reports of nasal itching and sneezing - no fevers, chills, cough, or shortness of breath  - reports she takes a liquid medication (not in medication list and cannot recall name) which helps a little.  - per chart review, Zyrtec on medication list but patient is not taking currently    ROS: See HPI.  PMFSH:  PMH: HTN Hemorrhoids: internal/external Constipation GERD Hx Rectal Bleeding Thyromegaly Low back pain   PHYSICAL EXAM: BP (!) 143/80 (BP Location: Right Arm, Patient Position: Sitting, Cuff Size: Normal)   Pulse 70   Temp 97.6 F (36.4 C) (Oral)   Ht 5\' 2"  (1.575 m)   Wt 148 lb 12.8 oz  (67.5 kg)   SpO2 98%   BMI 27.22 kg/m  GEN: NAD HEENT: Atraumatic, normocephalic, neck supple without lymphadenopathy, normal thyroid, EOMI, sclera clear. TMs normal bilaterally. Pharynx normal with moist mucous membranes.   CV: RRR, no murmurs, rubs, or gallops PULM: CTAB, normal effort MSK: no spinal tenderness. Tenderness to palpation of the muscles of the lumbosacral region bilaterally. Normal active ROM of hips bilaterally. Reports of tenderness in the lumbosacral areas with internal and external rotation of hips bilaterally. Straight leg raise is negative bilaterally. Normal strength and sensation to light touch of bilateral lower extremities. Normal patellar reflex bilaterally. SKIN: No rash or cyanosis; warm and well-perfused EXTR: No lower extremity edema or calf tenderness PSYCH: Mood and affect euthymic, normal rate and volume of speech NEURO: Awake, alert, no focal deficits grossly, normal speech   ASSESSMENT/PLAN: Bilateral low back pain: Bilateral low back pain with radiation of pain. Per history it is consistent with sciatica. Straight leg raise was negative today. No red flags. Has not been doing exercises given to her by prior provider. Per chart review, it mentions that husband did not want to pay for PT. Patient reports that her head feels heavy in the morning after starting Gabapentin 300mg  at night.  - encouraged to do exercises regularly - due to the above mentioned, will try Gabapentin 100mg  TID to see if that will relieve her side effect then slowly try to increase dose - follow up in 4 weeks   Seasonal allergic rhinitis,  unspecified chronicity, unspecified trigger Symptoms consistent with allergies. Zyrtec was on medication list but patient is not taking - restart Zyrtec.    Palma Holter, MD PGY 2 [facility]

## 2016-09-28 NOTE — Telephone Encounter (Signed)
[facility] outpatient pharmacy as they needed clarification on gabapentin Rx. He was given gabapentin 300 qHS on 09/07/2016 and again started on gabapentin 100 TID to space out dosing due to concern for side effects. This was clarified for the pharmacy

## 2016-09-28 NOTE — Telephone Encounter (Signed)
[facility] give [facility] call[facility] They need clarification on the gabapentin Rx that was sent into pharmacy. Please call [phone].  Clovis PuMartin, Kaelem Brach L, RN

## 2016-10-24 ENCOUNTER — Ambulatory Visit: Payer: 59 | Admitting: Obstetrics and Gynecology

## 2016-11-01 MED FILL — AMLODIPINE BESYLATE 5 MG TA: 5 | 30 days supply | Qty: 30 | Fill #3

## 2016-11-18 MED FILL — LIDOCAINE 2% VISCOUS SOLN: 2 | 8 days supply | Qty: 100 | Fill #0

## 2016-11-21 ENCOUNTER — Other Ambulatory Visit: Payer: Self-pay | Admitting: Gastroenterology

## 2016-11-21 DIAGNOSIS — R110 Nausea: Secondary | ICD-10-CM

## 2016-11-21 DIAGNOSIS — R1032 Left lower quadrant pain: Secondary | ICD-10-CM

## 2016-11-21 DIAGNOSIS — R197 Diarrhea, unspecified: Secondary | ICD-10-CM | POA: Diagnosis not present

## 2016-11-21 DIAGNOSIS — R1031 Right lower quadrant pain: Secondary | ICD-10-CM

## 2016-11-21 DIAGNOSIS — R112 Nausea with vomiting, unspecified: Secondary | ICD-10-CM | POA: Diagnosis not present

## 2016-11-21 MED FILL — ONDANSETRON ODT 4 MG TABLET: 4 | 14 days supply | Qty: 42 | Fill #0

## 2016-11-21 MED FILL — OXYCODONE W/APAP 5/325 TAB: 5-325 | 10 days supply | Qty: 30 | Fill #0

## 2016-12-13 MED FILL — AMLODIPINE BESYLATE 5 MG TA: 5 | 90 days supply | Qty: 90 | Fill #0

## 2016-12-27 ENCOUNTER — Ambulatory Visit: Payer: 59 | Admitting: Student

## 2017-01-03 MED FILL — LIDOCAINE 2% VISCOUS SOLN: 2 | 8 days supply | Qty: 100 | Fill #1

## 2017-03-29 ENCOUNTER — Ambulatory Visit
Admission: RE | Admit: 2017-03-29 | Discharge: 2017-03-29 | Disposition: A | Discharge status: Home / Self Care | Payer: 59 | Source: Ambulatory Visit | Attending: Gastroenterology | Admitting: Gastroenterology

## 2017-03-29 DIAGNOSIS — R1032 Left lower quadrant pain: Secondary | ICD-10-CM

## 2017-03-29 DIAGNOSIS — R110 Nausea: Secondary | ICD-10-CM

## 2017-03-29 DIAGNOSIS — R1031 Right lower quadrant pain: Secondary | ICD-10-CM | POA: Diagnosis not present

## 2017-03-29 MED ORDER — IOPAMIDOL (ISOVUE-300) INJECTION 61%
100.0000 mL | Freq: Once | INTRAVENOUS | Status: AC | PRN
Start: 2017-03-29 — End: 2017-03-29
  Administered 2017-03-29: 100 mL via INTRAVENOUS

## 2017-04-05 DIAGNOSIS — R1031 Right lower quadrant pain: Secondary | ICD-10-CM | POA: Diagnosis not present

## 2017-04-05 DIAGNOSIS — R1032 Left lower quadrant pain: Secondary | ICD-10-CM | POA: Diagnosis not present

## 2017-07-03 DIAGNOSIS — K5900 Constipation, unspecified: Secondary | ICD-10-CM | POA: Diagnosis not present

## 2017-07-03 DIAGNOSIS — R1084 Generalized abdominal pain: Secondary | ICD-10-CM | POA: Diagnosis not present

## 2017-11-14 ENCOUNTER — Ambulatory Visit: Payer: 59 | Admitting: Internal Medicine

## 2018-03-06 ENCOUNTER — Other Ambulatory Visit: Payer: Self-pay

## 2018-03-06 ENCOUNTER — Encounter: Payer: Self-pay | Admitting: Family Medicine

## 2018-03-06 ENCOUNTER — Ambulatory Visit (INDEPENDENT_AMBULATORY_CARE_PROVIDER_SITE_OTHER): Payer: 59 | Admitting: Family Medicine

## 2018-03-06 DIAGNOSIS — R109 Unspecified abdominal pain: Secondary | ICD-10-CM | POA: Insufficient documentation

## 2018-03-06 DIAGNOSIS — R1013 Epigastric pain: Secondary | ICD-10-CM

## 2018-03-06 MED ORDER — OMEPRAZOLE 20 MG PO CPDR: 20.0000 mg | DELAYED_RELEASE_CAPSULE | Freq: Every day | ORAL | 0 refills | Status: DC

## 2018-03-06 MED FILL — OMEPRAZOLE 20 MG CAP: 20 | 30 days supply | Qty: 30 | Fill #0

## 2018-03-06 NOTE — Progress Notes (Signed)
I was present during the visit and agree with the above note

## 2018-03-06 NOTE — Patient Instructions (Addendum)
Good to see today!  I am sending you home with Omeprazole to take once a day. Do not take this with Advil or ibuprofen.   We will contact you with the results of your blood work if anything is abnormal.   Please follow up in one week with Lovena Neighboursiallo, Abdoulaye, MD. Come back sooner if your symptoms worsen, you develop severe abdominal pain, fever, vomiting or diarrhea.    Elveria Risingimelie Kawanna Christley, Medical Student

## 2018-03-06 NOTE — Assessment & Plan Note (Addendum)
Patient is generally well appearing with tenderness to palpation of the epigastrium; otherwise her exam is unremarkable. Her differential is broad.  1 GERD: Patient has a history of GERD. However, she denies any change in her diet, classic burning pain or symptoms associated with eating particular foods or drinks. She paradoxically endorses improvement of her pain with ibuprofen.   2 acute pancreatitis: Patient describes epigastric pain and nausea, but denies vomiting or radiation of pain into the back. Patient does not endorse any history of trauma and denies alcohol use. Chart history does not reveal any history of autoimmune disease or gallstones. Will order a CMP and lipase to further evaluate.   4 acute gastritis: Patient does not drink alcohol. She does take NSAIDs, but not at a dose or frequency that would warrant suspicions of an ulcer. Will order CBC to assess for occult bleeding/blood loss.   5 gastroenteritis:  Patient does not complain of vomiting or diarrhea, but does report recent cold symptoms: subjective fever, sore throat, cough, shortness of breath, and myalgias. Patient also works at a [employer], with ample possibility for exposure to viral pathogens.   Etiology of Mrs. Lori Sullivan's abdominal pain is unknown. Patient given return precautions and encouraged to follow up with her PCP in one week. In the interim, patient is being sent home with Omeprazole to take once a day.

## 2018-03-06 NOTE — Progress Notes (Signed)
Subjective:   Patient ID: Lori Sullivan    DOB: 01/21/1971, 47 y.o. female   MRN: [id number]  Chief Complaint: stomach pain  History of Present Illness: Lori Sullivan is a 47 y.o. woman who presents to clinic today with her husband complaining of unchanged stomach pain for one week now. Patient describes intermittent abdominal pain, bloating, nausea, subjective fever, sore throat, trouble breathing, and myalgias. Patient has had decreased appetite secondary to her nausea and bloating, but has been drinking normally. Patient has a history of constipation, her but current symptoms are inconsistent with that pain. Treatment with Advil, with some improvement.  Patient denies any changes in her diet; primarily eats rice and drinks water. Patient works at a [employer]. Denies any known sick contacts. Patient reports amenorrhea for 3 years now. Patient denies new medications, over the counter supplements, alcohol, tobacco or illicit drug use. Patient denies chest pain, palpitations, vomiting, diarrhea, constipation, hematochezia, melena, dysuria, frequency, hematuria, new vaginal bleeding or discharge, rash or changes to the skin.   Review of Systems  Constitutional: Negative for fever.  HENT: Positive for sore throat.   Respiratory: Negative for cough.   Gastrointestinal: Positive for abdominal pain, constipation and nausea. Negative for blood in stool, diarrhea and vomiting.  Genitourinary: Negative for dysuria, flank pain, frequency, hematuria and urgency.  Musculoskeletal: Positive for myalgias.  Skin: Negative for rash.  Psychiatric/Behavioral: Negative for substance abuse.     Current Outpatient Medications  Medication Sig Dispense Refill  . cetirizine (ZYRTEC) 10 MG tablet Take 1 tablet (10 mg total) by mouth daily. 30 tablet 2  . gabapentin (NEURONTIN) 100 MG capsule Take 1 capsule (100 mg total) by mouth 3 (three) times daily. 90 capsule 1  . polyethylene glycol powder (GLYCOLAX/MIRALAX) powder  Take 17 g by mouth daily. (Patient not taking: Reported on 03/31/2016) 500 g 1   No current facility-administered medications for this visit.     Objective:   BP (!) 146/82   Pulse 63   Temp 97.7 F (36.5 C) (Oral)   Wt 136 lb 12.8 oz (62.1 kg)   SpO2 98%   BMI 25.02 kg/m   Physical Exam  Constitutional: She appears well-developed and well-nourished.  Cardiovascular: Normal rate, regular rhythm and normal heart sounds.  No murmur heard. Pulmonary/Chest: Effort normal and breath sounds normal. She has no wheezes. She exhibits no tenderness.  Abdominal: Soft. Normal appearance and bowel sounds are normal. She exhibits no mass. There is no hepatosplenomegaly. There is tenderness in the right lower quadrant, epigastric area and left lower quadrant. There is no CVA tenderness.  Skin: Skin is warm. Capillary refill takes less than 2 seconds.   Preceptor Exam  - abdomen is soft with mild tenderness in epigastric region without guarding or rebound.  No masses or HSM on exam.  Patient moves around table and room without signs of distress  Patient indicates some pain in her lateral buttocks area bilaterally.  Is mildly tender to palpation without change in pain with hip ROM.  No masses or deformity noted   Assessment & Plan:  Lori Sullivan is a 47 y.o. woman with history of GERD and constipation who presents to clinic today for unimproved epigastric pain for one week.  Abdominal pain Patient is generally well appearing with tenderness to palpation of the epigastrium; otherwise her exam is unremarkable. Her differential is broad.  1 GERD: Patient has a history of GERD. However, she denies any change in her diet, classic  burning pain or symptoms associated with eating particular foods or drinks. She paradoxically endorses improvement of her pain with ibuprofen.   2 acute pancreatitis: Patient describes epigastric pain and nausea, but denies vomiting or radiation of pain into the back. Patient does  not endorse any history of trauma and denies alcohol use. Chart history does not reveal any history of autoimmune disease or gallstones. Will order a CMP and lipase to further evaluate.   4 acute gastritis: Patient does not drink alcohol. She does take NSAIDs, but not at a dose or frequency that would warrant suspicions of an ulcer. Will order CBC to assess for occult bleeding/blood loss.   5 gastroenteritis:  Patient does not complain of vomiting or diarrhea, but does report recent cold symptoms: subjective fever, sore throat, cough and shortness of breath. Patient also works at a [employer], with ample possibility for exposure to viral pathogens.   Etiology of Lori Sullivan's abdominal pain is unknown. Patient given return precautions and encouraged to follow up with her PCP in one week. In the interim, patient is being sent home with Omeprazole to take once a day.   Orders Placed This Encounter  Procedures  . CBC  . CMP14+EGFR  . Lipase   Meds ordered this encounter  Medications  . omeprazole (PRILOSEC) 20 MG capsule    Sig: Take 1 capsule (20 mg total) by mouth daily.    Dispense:  30 capsule    Refill:  [facility] 03/06/2018 10:33 AM

## 2018-03-07 LAB — CBC
Hematocrit: 40.4 % (ref 34.0–46.6)
Hemoglobin: 13.1 g/dL (ref 11.1–15.9)
MCH: 25.6 pg — ABNORMAL LOW (ref 26.6–33.0)
MCHC: 32.4 g/dL (ref 31.5–35.7)
MCV: 79 fL (ref 79–97)
Platelets: 296 10*3/uL (ref 150–450)
RBC: 5.12 x10E6/uL (ref 3.77–5.28)
RDW: 14.1 % (ref 12.3–15.4)
WBC: 4.9 10*3/uL (ref 3.4–10.8)

## 2018-03-07 LAB — CMP14+EGFR
ALT: 21 IU/L (ref 0–32)
AST: 20 IU/L (ref 0–40)
Albumin/Globulin Ratio: 1.4 (ref 1.2–2.2)
Albumin: 4.2 g/dL (ref 3.5–5.5)
Alkaline Phosphatase: 79 IU/L (ref 39–117)
BUN/Creatinine Ratio: 14 (ref 9–23)
BUN: 12 mg/dL (ref 6–24)
Bilirubin Total: 0.4 mg/dL (ref 0.0–1.2)
CO2: 24 mmol/L (ref 20–29)
Calcium: 9.7 mg/dL (ref 8.7–10.2)
Chloride: 104 mmol/L (ref 96–106)
Creatinine, Ser: 0.84 mg/dL (ref 0.57–1.00)
GFR calc Af Amer: 96 mL/min/{1.73_m2} (ref >59)
GFR calc non Af Amer: 83 mL/min/{1.73_m2} (ref >59)
Globulin, Total: 3.1 g/dL (ref 1.5–4.5)
Glucose: 93 mg/dL (ref 65–99)
Potassium: 4.5 mmol/L (ref 3.5–5.2)
Sodium: 142 mmol/L (ref 134–144)
Total Protein: 7.3 g/dL (ref 6.0–8.5)

## 2018-03-07 LAB — LIPASE: Lipase: 30 U/L (ref 14–72)

## 2018-03-08 ENCOUNTER — Encounter: Payer: Self-pay | Admitting: Family Medicine

## 2018-03-09 ENCOUNTER — Encounter: Payer: Self-pay | Admitting: Family Medicine

## 2018-03-28 ENCOUNTER — Other Ambulatory Visit: Payer: Self-pay | Admitting: Family Medicine

## 2018-03-28 DIAGNOSIS — R1013 Epigastric pain: Secondary | ICD-10-CM

## 2018-03-30 MED FILL — OMEPRAZOLE 20 MG CPDR: 20 | 30 days supply | Qty: 30 | Fill #0

## 2018-04-10 ENCOUNTER — Ambulatory Visit (INDEPENDENT_AMBULATORY_CARE_PROVIDER_SITE_OTHER): Payer: 59 | Admitting: Family Medicine

## 2018-04-10 ENCOUNTER — Other Ambulatory Visit: Payer: Self-pay

## 2018-04-10 ENCOUNTER — Encounter: Payer: Self-pay | Admitting: Family Medicine

## 2018-04-10 DIAGNOSIS — K5900 Constipation, unspecified: Secondary | ICD-10-CM | POA: Diagnosis not present

## 2018-04-10 DIAGNOSIS — R1013 Epigastric pain: Secondary | ICD-10-CM

## 2018-04-10 MED ORDER — OMEPRAZOLE 20 MG PO CPDR: 20.0000 mg | DELAYED_RELEASE_CAPSULE | Freq: Every day | ORAL | 0 refills | Status: DC

## 2018-04-10 MED ORDER — POLYETHYLENE GLYCOL 3350 17 GM/SCOOP PO POWD: 17.0000 g | Freq: Every day | ORAL | 1 refills | Status: DC

## 2018-04-10 MED FILL — SM CLEARLAX POWDER: 30 days supply | Qty: 510 | Fill #0

## 2018-04-10 NOTE — Assessment & Plan Note (Signed)
Patient presented with diffuse abdominal pain and tenderness to palpation worse in the epigastric and right upper quadrant.  Patient also reports mild constipation.  Abdominal pain could be secondary to that.  Patient was previously on MiraLAX.  Will resume and follow-up as needed

## 2018-04-10 NOTE — Assessment & Plan Note (Signed)
Patient is following up today after being started on omeprazole last office visit with some improvement in symptoms.  Continues to be on NSAIDs (Advil) as well as poor diet (acidic and spicy food).  Medications from [place] showed PPI medication similar to omeprazole.  However given worsening pain in the right upper quadrant not exacerbated with almost make a think of cholecystitis.  CMP lipase and CBC from last office visit were all within normal limits. --We will increase omeprazole to 20 mg twice daily --Consider right upper quadrant ultrasound if symptoms do not resolve for improving next few days to weeks. --Recommended continuing Tylenol and discontinuing Advil due to risk of gastritis. --Patient follow-up in clinic on as-needed basis.

## 2018-04-10 NOTE — Patient Instructions (Signed)
It was great seeing you today! We have addressed the following issues today  1. Increase omeprazole to 20 mg in the morning and in the evening.  2. I will prescribe miralax for the constipation  3. Stop taking advil, you can use tylenol. 4. Avoid spicy and acidic food. 5. If abdominal pain does not improve come back to clinic for further evaluation.  If we did any lab work today, and the results require attention, either me or my nurse will get in touch with you. If everything is normal, you will get a letter in mail and a message via . If you don't hear from us in two weeks, please give us a call. Otherwise, we look forward to seeing you again at your next visit. If you have any questions or concerns before then, please call the clinic at [phone].  Please bring all your medications to every doctors visit  Sign up for My Chart to have easy access to your labs results, and communication with your Primary care physician. Please ask Front Desk for some assistance.   Please check-out at the front desk before leaving the clinic.    Take Care,   Dr. Sydnee Cabaliallo

## 2018-04-10 NOTE — Progress Notes (Signed)
Subjective:    Patient ID: Lori Sullivan, female    DOB: 02/17/1971, 47 y.o.   MRN: [id number]   CC: Follow up for abdominal pain  HPI: Patient is a 47 year old female who presents today complaining of abdominal pain.  Patient was recently seen in clinic for similar symptoms and was started on omeprazole 20 mg daily.  Patient reports that there is mild improvement in her symptoms, however she is still experiencing some diffuse abdominal pain more pronounced in the epigastric and right upper quadrant.  Patient continued to take Advil intermittently as needed to advise her to do so at last visit due to concern for gastritis.  Patient denies any nausea vomiting but does endorse constipation.  Patient denies any fevers or chills.  Pain is not exacerbated by meals.  She continues to have a diet with plenty of spicy foods.  No other red flags.  Patient brought in medication from [place] first evaluated.  Of note no interpretive service for available today.  Considered rescheduling patient on a different day but reluctant to do so.  Today's visit was partly translated by her husband who has limited control ovethe language as well.  Smoking status reviewed   ROS: all other systems were reviewed and are negative other than in the HPI   Past Medical History:  Diagnosis Date  . Blood transfusion without reported diagnosis   . Hypertension    Pt stated she is not sure what HTN meds she was previously taking because her husband takes care of that.    History reviewed. No pertinent surgical history.  Past medical history, surgical, family, and social history reviewed and updated in the EMR as appropriate.  Objective:  BP (!) 148/82   Pulse 65   Temp 97.6 F (36.4 C) (Oral)   Ht 5\' 2"  (1.575 m)   Wt 135 lb (61.2 kg)   SpO2 99%   BMI 24.69 kg/m   Vitals and nursing note reviewed  General: NAD, pleasant, able to participate in exam Cardiac: RRR, normal heart sounds, no murmurs. 2+ radial and PT  pulses bilaterally Respiratory: CTAB, normal effort, No wheezes, rales or rhonchi Abdomen: soft, tender to palpation diffusely with increased pain in the right upper quadrant epigastric area.  nondistended, no hepatic or splenomegaly, +BS Extremities: no edema or cyanosis. WWP. Skin: warm and dry, no rashes noted Neuro: alert and oriented x4, no focal deficits Psych: Normal affect and mood   Assessment & Plan:   Constipation Patient presented with diffuse abdominal pain and tenderness to palpation worse in the epigastric and right upper quadrant.  Patient also reports mild constipation.  Abdominal pain could be secondary to that.  Patient was previously on MiraLAX.  Will resume and follow-up as needed  Abdominal pain Patient is following up today after being started on omeprazole last office visit with some improvement in symptoms.  Continues to be on NSAIDs (Advil) as well as poor diet (acidic and spicy food).  Medications from [place] showed PPI medication similar to omeprazole.  However given worsening pain in the right upper quadrant not exacerbated with almost make a think of cholecystitis.  CMP lipase and CBC from last office visit were all within normal limits. --We will increase omeprazole to 20 mg twice daily --Consider right upper quadrant ultrasound if symptoms do not resolve for improving next few days to weeks. --Recommended continuing Tylenol and discontinuing Advil due to risk of gastritis. --Patient follow-up in clinic on as-needed basis.  Lovena Neighbours, MD [facility] PGY-3

## 2018-04-23 MED FILL — OMEPRAZOLE 20 MG CPDR: 20 | 30 days supply | Qty: 30 | Fill #0

## 2018-05-18 MED FILL — OMEPRAZOLE 20 MG CPDR: 20 | 30 days supply | Qty: 30 | Fill #1

## 2018-06-04 DIAGNOSIS — R0781 Pleurodynia: Secondary | ICD-10-CM | POA: Diagnosis not present

## 2018-06-04 DIAGNOSIS — K625 Hemorrhage of anus and rectum: Secondary | ICD-10-CM | POA: Diagnosis not present

## 2018-06-04 MED FILL — METHYLPREDNISOLONE 4 MG TAB: 4 | 6 days supply | Qty: 21 | Fill #0

## 2018-07-02 ENCOUNTER — Other Ambulatory Visit: Payer: Self-pay | Admitting: Family Medicine

## 2018-07-02 DIAGNOSIS — R1031 Right lower quadrant pain: Secondary | ICD-10-CM | POA: Diagnosis not present

## 2018-07-02 DIAGNOSIS — R1013 Epigastric pain: Secondary | ICD-10-CM

## 2018-07-02 DIAGNOSIS — R1011 Right upper quadrant pain: Secondary | ICD-10-CM | POA: Diagnosis not present

## 2018-07-02 DIAGNOSIS — R0781 Pleurodynia: Secondary | ICD-10-CM | POA: Diagnosis not present

## 2018-07-02 MED FILL — METHYLPREDNISOLONE 4 MG TAB: 4 | 6 days supply | Qty: 21 | Fill #1

## 2018-07-03 MED FILL — OMEPRAZOLE 20 MG CPDR: 20 | 60 days supply | Qty: 60 | Fill #0

## 2018-07-12 ENCOUNTER — Ambulatory Visit: Payer: 59 | Admitting: Family Medicine

## 2018-07-12 VITALS — BP 130/82 | HR 78 | Temp 97.6°F | Ht 62.0 in | Wt 135.0 lb

## 2018-07-12 DIAGNOSIS — R1011 Right upper quadrant pain: Secondary | ICD-10-CM | POA: Insufficient documentation

## 2018-07-12 DIAGNOSIS — Z23 Encounter for immunization: Secondary | ICD-10-CM | POA: Diagnosis not present

## 2018-07-12 DIAGNOSIS — R1013 Epigastric pain: Secondary | ICD-10-CM

## 2018-07-12 DIAGNOSIS — E782 Mixed hyperlipidemia: Secondary | ICD-10-CM | POA: Diagnosis not present

## 2018-07-12 NOTE — Assessment & Plan Note (Addendum)
RUQ pain seem to be chronic in nature, dull pain that is not asociated with meals or foods. Treated for GERD with omeprazole 20 mg bid but no change in RUQ pain. No current sign of constipation. Lipase has been checked in the past and was normal. Lipid panel from 2016 was abnormal and patient was not started on any statin at the time. Based on description and location of the pain, most likely cause of her pain would be cholecystitis, though symptoms described are not classic. Minimal concern for choledocholithiasis or cholangitis. Patient could have fatty liver given last lipid panel.  --Will order RUQ US to rule out any gallbladder pathology and also better assessment of liver anatomy --She will follow up with me in 10 days or so. --Will test for H pylori, IgG since she is on PPI, will not do breath test (false negative).

## 2018-07-12 NOTE — Progress Notes (Signed)
Subjective:    Patient ID: Lori Sullivan, female    DOB: 05/16/1971, 47 y.o.   MRN: [id number]   CC: RUQ pain   HPI: Patient is a 47 yo female who presents today to follow up on RUQ pain. This has been a chronic problem for year. I last saw her in clinic back in September 2019. Prior to that visit she was seen in clinic for similar complaint. Since last office visit, patient reports RUQ pain is still present, as a dull pain. Not associated with meals or certain type of foods. Patient was started on PPI and increase at last office visit with the thought that RUQ was related to GERD given her diet. She reports heartburn is better but RUQ pain is still present. Patient has not taken any pain medication. She has stopped taking Advil as instructed at last office visit. She also had a history of constipation and was on started on Miralax at last office visit. Patient report she has not used it recently because of diarrhea. Patient reports when she was in [place] she remembers having imaging done to look at her liver but cannot remember what she was told. Patient denies any fever, chills, nausea, vomiting, or constipation.    Smoking status reviewed   ROS: all other systems were reviewed and are negative other than in the HPI   Past Medical History:  Diagnosis Date  . Blood transfusion without reported diagnosis   . Hypertension    Pt stated she is not sure what HTN meds she was previously taking because her husband takes care of that.    History reviewed. No pertinent surgical history.  Past medical history, surgical, family, and social history reviewed and updated in the EMR as appropriate.  Objective:  BP 130/82   Pulse 78   Temp 97.6 F (36.4 C) (Oral)   Ht 5\' 2"  (1.575 m)   Wt 135 lb (61.2 kg)   SpO2 97%   BMI 24.69 kg/m   Vitals and nursing note reviewed  General: NAD, pleasant, able to participate in exam Cardiac: RRR, normal heart sounds, no murmurs. 2+ radial and PT pulses  bilaterally Respiratory: CTAB, normal effort, No wheezes, rales or rhonchi Abdomen: Mild RUQ tenderness with palpation, nondistended, no hepatic or splenomegaly, +BS Extremities: no edema or cyanosis. WWP. Skin: warm and dry, no rashes noted Neuro: alert and oriented x4, no focal deficits Psych: Normal affect and mood   Assessment & Plan:   RUQ pain RUQ pain seem to be chronic in nature, dull pain that is not asociated with meals or foods. Treated for GERD with omeprazole 20 mg bid but no change in RUQ pain. No current sign of constipation. Lipase has been checked in the past and was normal. Lipid panel from 2016 was abnormal and patient was not started on any statin at the time. Based on description and location of the pain, most likely cause of her pain would be cholecystitis, though symptoms described are not classic. Minimal concern for choledocholithiasis or cholangitis. Patient could have fatty liver given last lipid panel.  --Will order RUQ US to rule out any gallbladder pathology and also better assessment of liver anatomy --She will follow up with me in 10 days or so. --Will test for H pylori, IgG since she is on PPI, will not do breath test (false negative).  Mixed hyperlipidemia Last lipid panel was in 2016 and showed hypercholesterolemia with elevated LDL and triglycerides. Will repeat today and discuss need  to start statin therapy based on her ASCVD risk. Will recommend lifestyle change in the meantime.    Lovena NeighboursAbdoulaye Brighid Koch, MD [facility] PGY-3

## 2018-07-12 NOTE — Assessment & Plan Note (Signed)
Last lipid panel was in 2016 and showed hypercholesterolemia with elevated LDL and triglycerides. Will repeat today and discuss need to start statin therapy based on her ASCVD risk. Will recommend lifestyle change in the meantime.

## 2018-07-12 NOTE — Patient Instructions (Signed)
It was great seeing you today! We have addressed the following issues today  1. We will do an right upper quadrant ultrasound and will follow up on the results.  2. I will also check her lipid panel and test her for H pylori. 3. Make an appointment to see me in early January so we can discuss the results and plan going forward.   If we did any lab work today, and the results require attention, either me or my nurse will get in touch with you. If everything is normal, you will get a letter in mail and a message via . If you don't hear from us in two weeks, please give us a call. Otherwise, we look forward to seeing you again at your next visit. If you have any questions or concerns before then, please call the clinic at [phone].  Please bring all your medications to every doctors visit  Sign up for My Chart to have easy access to your labs results, and communication with your Primary care physician. Please ask Front Desk for some assistance.   Please check-out at the front desk before leaving the clinic.    Take Care,   Dr. Sydnee Cabaliallo

## 2018-07-13 LAB — LIPID PANEL
Chol/HDL Ratio: 7.4 ratio — ABNORMAL HIGH (ref 0.0–4.4)
Cholesterol, Total: 268 mg/dL — ABNORMAL HIGH (ref 100–199)
HDL: 36 mg/dL — ABNORMAL LOW (ref >39)
Triglycerides: 575 mg/dL (ref 0–149)

## 2018-07-13 LAB — H. PYLORI ANTIBODY, IGG: H. pylori, IgG AbS: 0.3 Index Value (ref 0.00–0.79)

## 2018-07-17 ENCOUNTER — Ambulatory Visit (HOSPITAL_COMMUNITY)
Admission: RE | Admit: 2018-07-17 | Discharge: 2018-07-17 | Disposition: A | Discharge status: Home / Self Care | Payer: 59 | Source: Ambulatory Visit | Attending: Family Medicine | Admitting: Family Medicine

## 2018-07-17 DIAGNOSIS — R1011 Right upper quadrant pain: Secondary | ICD-10-CM | POA: Diagnosis not present

## 2018-07-27 ENCOUNTER — Ambulatory Visit: Payer: 59 | Admitting: Family Medicine

## 2018-08-07 ENCOUNTER — Encounter: Payer: Self-pay | Admitting: Family Medicine

## 2018-08-07 ENCOUNTER — Ambulatory Visit (INDEPENDENT_AMBULATORY_CARE_PROVIDER_SITE_OTHER): Payer: 59 | Admitting: Family Medicine

## 2018-08-07 ENCOUNTER — Other Ambulatory Visit: Payer: Self-pay

## 2018-08-07 VITALS — BP 136/90 | HR 68 | Ht 62.0 in | Wt 135.0 lb

## 2018-08-07 DIAGNOSIS — I10 Essential (primary) hypertension: Secondary | ICD-10-CM | POA: Diagnosis not present

## 2018-08-07 DIAGNOSIS — E782 Mixed hyperlipidemia: Secondary | ICD-10-CM

## 2018-08-07 DIAGNOSIS — R1011 Right upper quadrant pain: Secondary | ICD-10-CM

## 2018-08-07 DIAGNOSIS — R1013 Epigastric pain: Secondary | ICD-10-CM | POA: Diagnosis not present

## 2018-08-07 MED ORDER — ATORVASTATIN CALCIUM 40 MG PO TABS: 40.0000 mg | ORAL_TABLET | Freq: Every day | ORAL | 3 refills | Status: DC

## 2018-08-07 MED ORDER — OMEPRAZOLE 20 MG PO CPDR: 20.0000 mg | DELAYED_RELEASE_CAPSULE | Freq: Two times a day (BID) | ORAL | 0 refills | Status: DC

## 2018-08-07 MED ORDER — CETIRIZINE HCL 10 MG PO TABS: 10.0000 mg | ORAL_TABLET | Freq: Every day | ORAL | 2 refills | Status: DC

## 2018-08-07 MED FILL — ATORVASTATIN 40 MG TABLET: 40 | 90 days supply | Qty: 90 | Fill #0

## 2018-08-07 MED FILL — OMEPRAZOLE 20 MG CPDR: 20 | 30 days supply | Qty: 60 | Fill #0

## 2018-08-07 NOTE — Assessment & Plan Note (Signed)
RUQ US showed normal liver architecture with no sign of gallstones or biliary sludge. RUQ pain has improved. Patient lipid panel was remarkable for elevated cholesterol at 268, LDL incalculable, and triglycerides 575. Suspect GERD playing a role in reported pain. Now on PPI bid will continue to monitor. Patient could also be having pain from pancreas given elevated triglycerides. --Follow up on Lipase, repeat lipid panel and A1c

## 2018-08-07 NOTE — Assessment & Plan Note (Addendum)
Lipid panel was grossly abnormal with a cholesterol 268, HDL 36, Triglyceride 575 and LDL to high to calculate. Will repeat today. ASCVD 3.1%. With initiation of BP agent 4.1%. Given age and HTN will start patient on moderate intensity statin and she will continue to work on lifestyle modifications. --Follow up on repeat lipid panel

## 2018-08-07 NOTE — Assessment & Plan Note (Signed)
BP mildly elevated on two consecutives OV. Will discuss therapeutic lifestyle changes vs initiation of oral agent at next office visit. Patient started today on statin therapy.

## 2018-08-07 NOTE — Progress Notes (Signed)
   Subjective:    Patient ID: Lori Sullivan, female    DOB: [birth year], 48 y.o.   MRN: [id number]   CC: Follow up RUQ  HPI: Patient is a 48 yo with a past medical history significant for HTN, allergies, hyperlipidemia who presents today to follow up on RUQ pain. Patient had a RUQ US done after last office visit and would like to know the results. She reports RUQ pain is improved and she does not have as much pain. She takes her omeprazole two times a day. She reports some times she feels like she has something in her throat that prevent her from breathing well. She also endorses itchy eyes and intermittent rhinorrhea. Patient has a history of seasonal allergies but has not taken Zyrtec in a few months. Patient currently denies any chest pain, SOB, abdominal pain, nausea, vomiting.   Smoking status reviewed   ROS: all other systems were reviewed and are negative other than in the HPI   Past Medical History:  Diagnosis Date  . Blood transfusion without reported diagnosis   . Hypertension    Pt stated she is not sure what HTN meds she was previously taking because her husband takes care of that.    No past surgical history on file.  Past medical history, surgical, family, and social history reviewed and updated in the EMR as appropriate.  Objective:  BP 136/90   Pulse 68   Ht 5\' 2"  (1.575 m)   Wt 135 lb (61.2 kg)   SpO2 98%   BMI 24.69 kg/m   Vitals and nursing note reviewed  General: NAD, pleasant, able to participate in exam Cardiac: RRR, normal heart sounds, no murmurs. 2+ radial and PT pulses bilaterally Respiratory: CTAB, normal effort, No wheezes, rales or rhonchi Abdomen: soft, nontender, nondistended, no hepatic or splenomegaly, +BS Extremities: no edema or cyanosis. WWP. Skin: warm and dry, no rashes noted Neuro: alert and oriented x4, no focal deficits Psych: Normal affect and mood   Assessment & Plan:   RUQ pain RUQ US showed normal liver architecture with no sign  of gallstones or biliary sludge. RUQ pain has improved. Patient lipid panel was remarkable for elevated cholesterol at 268, LDL incalculable, and triglycerides 575. Suspect GERD playing a role in reported pain. Now on PPI bid will continue to monitor. Patient could also be having pain from pancreas given elevated triglycerides. --Follow up on Lipase, repeat lipid panel and A1c  Essential hypertension BP mildly elevated on two consecutives OV. Will discuss therapeutic lifestyle changes vs initiation of oral agent at next office visit. Patient started today on statin therapy.  Mixed hyperlipidemia Lipid panel was grossly abnormal with a cholesterol 268, HDL 36, Triglyceride 575 and LDL to high to calculate. Will repeat today. ASCVD 3.1%. With initiation of BP agent 4.1%. Given age and HTN will start patient on moderate intensity statin and she will continue to work on lifestyle modifications. --Follow up on repeat lipid panel    Lovena Neighbours, MD [facility] PGY-3

## 2018-08-08 LAB — LIPASE: Lipase: 34 U/L (ref 14–72)

## 2018-08-08 LAB — CBC WITH DIFFERENTIAL/PLATELET
Basophils Absolute: 0.1 10*3/uL (ref 0.0–0.2)
Basos: 2 %
EOS (ABSOLUTE): 0.8 10*3/uL — ABNORMAL HIGH (ref 0.0–0.4)
Eos: 12 %
Hematocrit: 41.7 % (ref 34.0–46.6)
Hemoglobin: 13.7 g/dL (ref 11.1–15.9)
Immature Grans (Abs): 0 10*3/uL (ref 0.0–0.1)
Immature Granulocytes: 0 %
Lymphocytes Absolute: 1.9 10*3/uL (ref 0.7–3.1)
Lymphs: 27 %
MCH: 25.4 pg — ABNORMAL LOW (ref 26.6–33.0)
MCHC: 32.9 g/dL (ref 31.5–35.7)
MCV: 77 fL — ABNORMAL LOW (ref 79–97)
Monocytes Absolute: 0.7 10*3/uL (ref 0.1–0.9)
Monocytes: 11 %
Neutrophils Absolute: 3.4 10*3/uL (ref 1.4–7.0)
Neutrophils: 48 %
Platelets: 309 10*3/uL (ref 150–450)
RBC: 5.4 x10E6/uL — ABNORMAL HIGH (ref 3.77–5.28)
RDW: 13.7 % (ref 11.7–15.4)
WBC: 6.9 10*3/uL (ref 3.4–10.8)

## 2018-08-08 LAB — LIPID PANEL
Chol/HDL Ratio: 6.9 ratio — ABNORMAL HIGH (ref 0.0–4.4)
Cholesterol, Total: 248 mg/dL — ABNORMAL HIGH (ref 100–199)
HDL: 36 mg/dL — ABNORMAL LOW (ref >39)
LDL Calculated: 175 mg/dL — ABNORMAL HIGH (ref 0–99)
Triglycerides: 187 mg/dL — ABNORMAL HIGH (ref 0–149)
VLDL Cholesterol Cal: 37 mg/dL (ref 5–40)

## 2018-08-08 LAB — HEMOGLOBIN A1C
Est. average glucose Bld gHb Est-mCnc: 120 mg/dL
Hgb A1c MFr Bld: 5.8 % — ABNORMAL HIGH (ref 4.8–5.6)

## 2018-08-08 LAB — TSH: TSH: 2.4 u[IU]/mL (ref 0.450–4.500)

## 2018-10-08 ENCOUNTER — Ambulatory Visit (INDEPENDENT_AMBULATORY_CARE_PROVIDER_SITE_OTHER): Payer: 59 | Admitting: Family Medicine

## 2018-10-08 ENCOUNTER — Other Ambulatory Visit: Payer: Self-pay

## 2018-10-08 ENCOUNTER — Encounter: Payer: Self-pay | Admitting: Family Medicine

## 2018-10-08 VITALS — BP 122/75 | Temp 98.0°F | Ht 62.0 in | Wt 137.0 lb

## 2018-10-08 DIAGNOSIS — M545 Low back pain, unspecified: Secondary | ICD-10-CM

## 2018-10-08 MED ORDER — MELOXICAM 7.5 MG PO TABS: 7.5000 mg | ORAL_TABLET | Freq: Every day | ORAL | 0 refills | Status: AC

## 2018-10-08 MED FILL — MELOXICAM 7.5 MG TABLET: 7.5 | 10 days supply | Qty: 10 | Fill #0

## 2018-10-08 NOTE — Assessment & Plan Note (Signed)
Patient presents with low back pain which seems to be chronic in nature.  Patient describes some radiculopathy bilaterally associated with lumbar pain.  Exam was unremarkable except for lateral thigh pain with straight leg raise possibly concerning for iliotibial band syndrome.  Given radiculopathy patient may have possible nerve impingement though she denies any back spasm, recent fall or trauma.  Do not suspect fracture.  Symptoms are not severe enough to warrant any imaging at this time.  We will start with conservative management. --Prescribe meloxicam 7.5 mg daily for the next 10 days. --Lumbar and iliotibial band syndrome home exercises. --Heating pad. --Daily exercise i.e. walking. --Will check B12 and vitamin D level. --Follow-up in clinic in a few weeks if symptoms do not improve or worsen.

## 2018-10-08 NOTE — Progress Notes (Signed)
   Subjective:    Patient ID: Lori Sullivan, female    DOB: [birth year], 48 y.o.   MRN: [id number]   CC: Low back pain  HPI: Patient is a 48 year old female who presents here today complaining of low back pain for the past few weeks.  Patient reports that she has low back pain radiating down her leg on both sides.  She denies any recent injury or trauma.  Patient reports that she has had back pain intermittently for the past few years.  She has not tried any medication or any therapy for it.  Patient is currently taking gabapentin 100 mg 3 times a day.  Patient denies any knee pain.  She does report some possible neuropathy in her lower extremity bilaterally.  Patient denies any urinary incontinence or saddle anesthesia.  She denies any fevers, neck stiffness.  Patient is working at a [employer] and does not exercise on a regular basis.  Smoking status reviewed   ROS: all other systems were reviewed and are negative other than in the HPI   Past Medical History:  Diagnosis Date  . Blood transfusion without reported diagnosis   . Hypertension    Pt stated she is not sure what HTN meds she was previously taking because her husband takes care of that.    History reviewed. No pertinent surgical history.  Past medical history, surgical, family, and social history reviewed and updated in the EMR as appropriate.  Objective:  BP 122/75   Temp 98 F (36.7 C) (Oral)   Ht 5\' 2"  (1.575 m)   Wt 137 lb (62.1 kg)   BMI 25.06 kg/m   Vitals and nursing note reviewed  General: NAD, pleasant, able to participate in exam Cardiac: RRR, normal heart sounds, no murmurs. 2+ radial and PT pulses bilaterally Respiratory: CTAB, normal effort, No wheezes, rales or rhonchi Abdomen: soft, nontender, nondistended, no hepatic or splenomegaly, +BS Low back exam: No lumbar lordosis, thoracic kyphosis, scoliosis, pelvic assymmetry/tilt. No TTP of spinous process, sacroiliac joints, paraspinous muscles.Full lumbar  flexion/extension, strength Hip (adduction, flexion), normal straight leg raise, FABER, patellar and achilles reflex intact.  Extremities: no edema or cyanosis. WWP. Skin: warm and dry, no rashes noted Neuro: alert and oriented x4, no focal deficits Psych: Normal affect and mood   Assessment & Plan:   Lumbar pain Patient presents with low back pain which seems to be chronic in nature.  Patient describes some radiculopathy bilaterally associated with lumbar pain.  Exam was unremarkable except for lateral thigh pain with straight leg raise possibly concerning for iliotibial band syndrome.  Given radiculopathy patient may have possible nerve impingement though she denies any back spasm, recent fall or trauma.  Do not suspect fracture.  Symptoms are not severe enough to warrant any imaging at this time.  We will start with conservative management. --Prescribe meloxicam 7.5 mg daily for the next 10 days. --Lumbar and iliotibial band syndrome home exercises. --Heating pad. --Daily exercise i.e. walking. --Will check B12 and vitamin D level. --Follow-up in clinic in a few weeks if symptoms do not improve or worsen.    Lovena Neighbours, MD [facility] PGY-3

## 2018-10-08 NOTE — Patient Instructions (Addendum)
It was great seeing you today! We have addressed the following issues today  1. I sent a medication to help with your back pain. You will take it once a day for the next 10 days. If you symptoms improve you can stop taking.  2. You can also use a heating pad  3. Make sure you exercise daily 4. I will check B12 and Vitamin D level today as well.   If we did any lab work today, and the results require attention, either me or my nurse will get in touch with you. If everything is normal, you will get a letter in mail and a message via . If you don't hear from [place] in two weeks, please give [place] a call. Otherwise, we look forward to seeing you again at your next visit. If you have any questions or concerns before then, please call the clinic at [phone].  Please bring all your medications to every doctors visit  Sign up for My Chart to have easy access to your labs results, and communication with your Primary care physician. Please ask Front Desk for some assistance.   Please check-out at the front desk before leaving the clinic.    Take Care,   Dr. Sydnee Cabal   Iliotibial Band Syndrome Rehab Ask your health care provider which exercises are safe for you. Do exercises exactly as told by your health care provider and adjust them as directed. It is normal to feel mild stretching, pulling, tightness, or discomfort as you do these exercises, but you should stop right away if you feel sudden pain or your pain gets worse.Do not begin these exercises until told by your health care provider. Stretching and range of motion exercises These exercises warm up your muscles and joints and improve the movement and flexibility of your hip and pelvis. Exercise A: Quadriceps, prone  1. Lie on your abdomen on a firm surface, such as a bed or padded floor. 2. Bend your left / right knee and hold your ankle. If you cannot reach your ankle or pant leg, loop a belt around your foot and grab the belt  instead. 3. Gently pull your heel toward your buttocks. Your knee should not slide out to the side. You should feel a stretch in the front of your thigh and knee. 4. Hold this position for __________ seconds. Repeat __________ times. Complete this stretch __________ times a day. Exercise B: Iliotibial band  1. Lie on your side with your left / right leg in the top position. 2. Bend both of your knees and grab your left / right ankle. Stretch out your bottom arm to help you balance. 3. Slowly bring your top knee back so your thigh goes behind your trunk. 4. Slowly lower your top leg toward the floor until you feel a gentle stretch on the outside of your left / right hip and thigh. If you do not feel a stretch and your knee will not fall farther, place the heel of your other foot on top of your knee and pull your knee down toward the floor with your foot. 5. Hold this position for __________ seconds. Repeat __________ times. Complete this stretch __________ times a day. Strengthening exercises These exercises build strength and endurance in your hip and pelvis. Endurance is the ability to use your muscles for a long time, even after they get tired. Exercise C: Straight leg raises (hip abductors)  1. Lie on your side with your left / right leg  in the top position. Lie so your head, shoulder, knee, and hip line up. You may bend your bottom knee to help you balance. 2. Roll your hips slightly forward so your hips are stacked directly over each other and your left / right knee is facing forward. 3. Tense the muscles in your outer thigh and lift your top leg 4-6 inches (10-15 cm). 4. Hold this position for __________ seconds. 5. Slowly return to the starting position. Let your muscles relax completely before doing another repetition. Repeat __________ times. Complete this exercise __________ times a day. Exercise D: Straight leg raises (hip extensors) 1. Lie on your abdomen on your bed or a firm  surface. You can put a pillow under your hips if that is more comfortable. 2. Bend your left / right knee so your foot is straight up in the air. 3. Squeeze your buttock muscles and lift your left / right thigh off the bed. Do not let your back arch. 4. Tense this muscle as hard as you can without increasing any knee pain. 5. Hold this position for __________ seconds. 6. Slowly lower your leg to the starting position and allow it to relax completely. Repeat __________ times. Complete this exercise __________ times a day. Exercise E: Hip hike 1. Stand sideways on a bottom step. Stand on your left / right leg with your other foot unsupported next to the step. You can hold onto the railing or wall if needed for balance. 2. Keep your knees straight and your torso square. Then, lift your left / right hip up toward the ceiling. 3. Slowly let your left / right hip lower toward the floor, past the starting position. Your foot should get closer to the floor. Do not lean or bend your knees. Repeat __________ times. Complete this exercise __________ times a day. This information is not intended to replace advice given to you by your health care provider. Make sure you discuss any questions you have with your health care provider. Document Released: 07/04/2005 Document Revised: 03/08/2016 Document Reviewed: 06/05/2015 Elsevier Interactive Patient Education  2019 [employer].   Low Back Sprain Rehab Ask your health care provider which exercises are safe for you. Do exercises exactly as told by your health care provider and adjust them as directed. It is normal to feel mild stretching, pulling, tightness, or discomfort as you do these exercises, but you should stop right away if you feel sudden pain or your pain gets worse. Do not begin these exercises until told by your health care provider. Stretching and range of motion exercises These exercises warm up your muscles and joints and improve the movement and  flexibility of your back. These exercises also help to relieve pain, numbness, and tingling. Exercise A: Lumbar rotation  1. Lie on your back on a firm surface and bend your knees. 2. Straighten your arms out to your sides so each arm forms an "L" shape with a side of your body (a 90 degree angle). 3. Slowly move both of your knees to one side of your body until you feel a stretch in your lower back. Try not to let your shoulders move off of the floor. 4. Hold for __________ seconds. 5. Tense your abdominal muscles and slowly move your knees back to the starting position. 6. Repeat this exercise on the other side of your body. Repeat __________ times. Complete this exercise __________ times a day. Exercise B: Prone extension on elbows  1. Lie on your abdomen on  a firm surface. 2. Prop yourself up on your elbows. 3. Use your arms to help lift your chest up until you feel a gentle stretch in your abdomen and your lower back. ? This will place some of your body weight on your elbows. If this is uncomfortable, try stacking pillows under your chest. ? Your hips should stay down, against the surface that you are lying on. Keep your hip and back muscles relaxed. 4. Hold for __________ seconds. 5. Slowly relax your upper body and return to the starting position. Repeat __________ times. Complete this exercise __________ times a day. Strengthening exercises These exercises build strength and endurance in your back. Endurance is the ability to use your muscles for a long time, even after they get tired. Exercise C: Pelvic tilt 1. Lie on your back on a firm surface. Bend your knees and keep your feet flat. 2. Tense your abdominal muscles. Tip your pelvis up toward the ceiling and flatten your lower back into the floor. ? To help with this exercise, you may place a small towel under your lower back and try to push your back into the towel. 3. Hold for __________ seconds. 4. Let your muscles relax  completely before you repeat this exercise. Repeat __________ times. Complete this exercise __________ times a day. Exercise D: Alternating arm and leg raises  1. Get on your hands and knees on a firm surface. If you are on a hard floor, you may want to use padding to cushion your knees, such as an exercise mat. 2. Line up your arms and legs. Your hands should be below your shoulders, and your knees should be below your hips. 3. Lift your left leg behind you. At the same time, raise your right arm and straighten it in front of you. ? Do not lift your leg higher than your hip. ? Do not lift your arm higher than your shoulder. ? Keep your abdominal and back muscles tight. ? Keep your hips facing the ground. ? Do not arch your back. ? Keep your balance carefully, and do not hold your breath. 4. Hold for __________ seconds. 5. Slowly return to the starting position and repeat with your right leg and your left arm. Repeat __________ times. Complete this exercise __________ times a day. Exercise E: Abdominal set with straight leg raise  1. Lie on your back on a firm surface. 2. Bend one of your knees and keep your other leg straight. 3. Tense your abdominal muscles and lift your straight leg up, 4-6 inches (10-15 cm) off the ground. 4. Keep your abdominal muscles tight and hold for __________ seconds. ? Do not hold your breath. ? Do not arch your back. Keep it flat against the ground. 5. Keep your abdominal muscles tense as you slowly lower your leg back to the starting position. 6. Repeat with your other leg. Repeat __________ times. Complete this exercise __________ times a day. Posture and body mechanics  Body mechanics refers to the movements and positions of your body while you do your daily activities. Posture is part of body mechanics. Good posture and healthy body mechanics can help to relieve stress in your body's tissues and joints. Good posture means that your spine is in its  natural S-curve position (your spine is neutral), your shoulders are pulled back slightly, and your head is not tipped forward. The following are general guidelines for applying improved posture and body mechanics to your everyday activities. Standing   When standing, keep  your spine neutral and your feet about hip-width apart. Keep a slight bend in your knees. Your ears, shoulders, and hips should line up.  When you do a task in which you stand in one place for a long time, place one foot up on a stable object that is 2-4 inches (5-10 cm) high, such as a footstool. This helps keep your spine neutral. Sitting   When sitting, keep your spine neutral and keep your feet flat on the floor. Use a footrest, if necessary, and keep your thighs parallel to the floor. Avoid rounding your shoulders, and avoid tilting your head forward.  When working at a desk or a computer, keep your desk at a height where your hands are slightly lower than your elbows. Slide your chair under your desk so you are close enough to maintain good posture.  When working at a computer, place your monitor at a height where you are looking straight ahead and you do not have to tilt your head forward or downward to look at the screen. Resting   When lying down and resting, avoid positions that are most painful for you.  If you have pain with activities such as sitting, bending, stooping, or squatting (flexion-based activities), lie in a position in which your body does not bend very much. For example, avoid curling up on your side with your arms and knees near your chest (fetal position).  If you have pain with activities such as standing for a long time or reaching with your arms (extension-based activities), lie with your spine in a neutral position and bend your knees slightly. Try the following positions:  Lying on your side with a pillow between your knees.  Lying on your back with a pillow under your  knees. Lifting   When lifting objects, keep your feet at least shoulder-width apart and tighten your abdominal muscles.  Bend your knees and hips and keep your spine neutral. It is important to lift using the strength of your legs, not your back. Do not lock your knees straight out.  Always ask for help to lift heavy or awkward objects. This information is not intended to replace advice given to you by your health care provider. Make sure you discuss any questions you have with your health care provider. Document Released: 07/04/2005 Document Revised: 03/10/2016 Document Reviewed: 04/15/2015 Elsevier Interactive Patient Education  2019 [employer].

## 2018-10-09 LAB — VITAMIN D 25 HYDROXY (VIT D DEFICIENCY, FRACTURES): Vit D, 25-Hydroxy: 23.1 ng/mL — ABNORMAL LOW (ref 30.0–100.0)

## 2018-10-09 LAB — VITAMIN B12: Vitamin B-12: 1370 pg/mL — ABNORMAL HIGH (ref 232–1245)

## 2018-10-30 ENCOUNTER — Other Ambulatory Visit: Payer: Self-pay | Admitting: Family Medicine

## 2018-10-30 DIAGNOSIS — R1013 Epigastric pain: Secondary | ICD-10-CM

## 2018-10-30 MED FILL — ATORVASTATIN 40 MG TABLET: 40 | 90 days supply | Qty: 90 | Fill #0

## 2018-10-30 MED FILL — OMEPRAZOLE 20 MG CPDR: 20 | 30 days supply | Qty: 60 | Fill #0

## 2019-03-18 MED FILL — SM CLEARLAX POWDER: 17 | 30 days supply | Qty: 510 | Fill #1

## 2019-03-26 ENCOUNTER — Other Ambulatory Visit: Payer: Self-pay

## 2019-03-26 ENCOUNTER — Ambulatory Visit (INDEPENDENT_AMBULATORY_CARE_PROVIDER_SITE_OTHER): Payer: 59 | Admitting: Family Medicine

## 2019-03-26 VITALS — BP 150/82 | HR 69 | Wt 134.8 lb

## 2019-03-26 DIAGNOSIS — M5432 Sciatica, left side: Secondary | ICD-10-CM | POA: Diagnosis not present

## 2019-03-26 DIAGNOSIS — E782 Mixed hyperlipidemia: Secondary | ICD-10-CM

## 2019-03-26 DIAGNOSIS — R1013 Epigastric pain: Secondary | ICD-10-CM | POA: Diagnosis not present

## 2019-03-26 MED ORDER — OMEPRAZOLE 20 MG PO CPDR: DELAYED_RELEASE_CAPSULE | ORAL | 0 refills | Status: DC

## 2019-03-26 MED ORDER — ATORVASTATIN CALCIUM 40 MG PO TABS: 40.0000 mg | ORAL_TABLET | Freq: Every day | ORAL | 3 refills | Status: DC

## 2019-03-26 MED ORDER — GABAPENTIN 100 MG PO CAPS: 200.0000 mg | ORAL_CAPSULE | Freq: Three times a day (TID) | ORAL | 1 refills | Status: DC

## 2019-03-26 MED FILL — ATORVASTATIN 40 MG TABLET: 40 | 90 days supply | Qty: 90 | Fill #0

## 2019-03-26 MED FILL — OMEPRAZOLE 20 MG CAPSULE DR: 20 | 30 days supply | Qty: 60 | Fill #0

## 2019-03-26 MED FILL — GABAPENTIN 100 MG CAPSULE: 100 | 15 days supply | Qty: 90 | Fill #0

## 2019-03-26 NOTE — Patient Instructions (Signed)
It was a pleasure to see you today! Thank you for choosing [facility] for your primary care. Lori Sullivan was seen for leg pain and upset stomach. Come back to the clinic in a few weeks to talk about your medicines.   Today we talked about your chronic leg pain.  This sounds like this is the type of pain called sciatica which is a nerve pain.  The appropriate medicine for this is gabapentin which for some reason you seem to have stopped, we will restart this at a higher dose than you had in the past.  If you get drowsy you can always take less or call back to [place].  If it does not seem to be covering your pain but you are not getting drowsy you may talk to [place] about going up in the dose.  We talked about your upset stomach which you currently treating with pantoprazole.  Please continue taking this we think that this is likely exacerbated by the fact the been taking 2 types of NSAIDs.  We talked about a potential referral to the stomach doctor to look at your stomach with a camera but you do not want to try that at this time which is fine.  Please stop taking all medicines known as NSAIDs because it seems like you might have a problem known as gastric reflux or stomach pain.  So you should not be taking indomethacin, meloxicam, ibuprofen, Aleve, Motrin.  You can take Tylenol for your chronic pain as well please not take more than 2000 mg/day.   Please bring all your medications to every doctors visit   Sign up for My Chart to have easy access to your labs results, and communication with your Primary care physician.     Please check-out at the front desk before leaving the clinic.     Best,  Dr. Sherene Sires FAMILY MEDICINE RESIDENT - PGY3 03/26/2019 9:48 AM

## 2019-03-28 DIAGNOSIS — M5431 Sciatica, right side: Secondary | ICD-10-CM | POA: Insufficient documentation

## 2019-03-28 DIAGNOSIS — M5432 Sciatica, left side: Secondary | ICD-10-CM | POA: Insufficient documentation

## 2019-03-28 NOTE — Progress Notes (Signed)
    Subjective:  Lori Sullivan is a 48 y.o. female who presents to the [facility] today with a chief complaint of left hip pain.   HPI: Sciatica of left side Persistent chronic sciatic pain down the left side.  No concerning neurovascular symptoms like paralysis or paresthesia.  Does say that the gabapentin helps but does not completely treat pain.  We discussed that she is on a low-dose of gabapentin that she can increase.  Mixed hyperlipidemia No muscular pain, will refill chronic statin.  Abdominal pain Stable, and has been using omeprazole.  Advised to stay off NSAIDs, will refill chronic omeprazole.    Objective:  Physical Exam: BP (!) 150/82   Pulse 69   Wt 134 lb 12.8 oz (61.1 kg)   SpO2 99%   BMI 24.66 kg/m   Gen: NAD, resting comfortably CV: RRR with no murmurs appreciated Pulm: NWOB, CTAB with no crackles, wheezes, or rhonchi GI: Normal bowel sounds present. Soft, Nontender, Nondistended. MSK: No pain to internal and external rotation of hip, no sign of bony abnormality to hip on exam, exam consistent with sciatic distribution from lower lumbar around her buttocks to lateral thigh and then anterior shin.  No symptoms on right side. Skin: warm, dry Neuro: grossly normal, moves all extremities Psych: Normal affect and thought content  No results found for this or any previous visit (from the past 72 hour(s)).   Assessment/Plan:  Mixed hyperlipidemia No muscular pain, will refill chronic statin.  Abdominal pain Stable, and has been using omeprazole.  Advised to stay off NSAIDs, will refill chronic omeprazole.   Sciatica of left side Persistent chronic sciatic pain down the left side.  No concerning neurovascular symptoms like paralysis or paresthesia.  Does say that the gabapentin helps but does not completely treat pain.  We discussed that she is on a low-dose of gabapentin that she can increase.  Increased to 200 mg gabapentin 3 times daily from 100 mg.  Sherene Sires, DO  FAMILY MEDICINE RESIDENT - PGY3 03/28/2019 11:05 AM

## 2019-03-28 NOTE — Assessment & Plan Note (Signed)
No muscular pain, will refill chronic statin.

## 2019-03-28 NOTE — Assessment & Plan Note (Signed)
Persistent chronic sciatic pain down the left side.  No concerning neurovascular symptoms like paralysis or paresthesia.  Does say that the gabapentin helps but does not completely treat pain.  We discussed that she is on a low-dose of gabapentin that she can increase.  Increased to 200 mg gabapentin 3 times daily from 100 mg.

## 2019-03-28 NOTE — Assessment & Plan Note (Signed)
Stable, and has been using omeprazole.  Advised to stay off NSAIDs, will refill chronic omeprazole.

## 2019-05-08 MED FILL — FLUARIX QUADRIVALENT 0.5 ML: 0.5 | 1 days supply | Qty: 1 | Fill #0

## 2019-06-04 MED FILL — GABAPENTIN 100 MG CAPSULE: 100 | 15 days supply | Qty: 90 | Fill #1

## 2019-06-05 ENCOUNTER — Other Ambulatory Visit: Payer: Self-pay | Admitting: Family Medicine

## 2019-06-05 DIAGNOSIS — R1013 Epigastric pain: Secondary | ICD-10-CM

## 2019-06-05 MED FILL — ATORVASTATIN 40 MG TABLET: 40 | 30 days supply | Qty: 30 | Fill #1

## 2019-06-06 MED FILL — OMEPRAZOLE 20 MG CAPSULE DR: 20 | 30 days supply | Qty: 60 | Fill #0

## 2019-07-05 ENCOUNTER — Ambulatory Visit: Payer: 59

## 2019-07-08 ENCOUNTER — Other Ambulatory Visit: Payer: Self-pay

## 2019-07-08 ENCOUNTER — Telehealth (INDEPENDENT_AMBULATORY_CARE_PROVIDER_SITE_OTHER): Payer: Self-pay | Admitting: Family Medicine

## 2019-07-08 ENCOUNTER — Ambulatory Visit: Payer: Self-pay

## 2019-07-08 NOTE — Progress Notes (Signed)
Patient screened positive prior to visit causing her appointment to be switched to virtual visit. Called for her appointment, using interpreter. Patient notes she has not concerns today, she would like to have a physical exam. She is frustrated and confused on why she arrived for her appointment but then was denied an in-person visit. Patient did not want to come back to [facility] due to this. Spent time apologizing and explaining the confusion. Recommended rescheduling for when her symptoms resolve so she can be evaluated in person. She agreed at this time. Appointment rescheduled with PCP on 07/15/19.   Plan discussed with Dr. McDiarmid.

## 2019-07-14 ENCOUNTER — Encounter (HOSPITAL_COMMUNITY): Payer: Self-pay

## 2019-07-14 ENCOUNTER — Other Ambulatory Visit: Payer: Self-pay

## 2019-07-14 ENCOUNTER — Ambulatory Visit (HOSPITAL_COMMUNITY)
Admission: EM | Admit: 2019-07-14 | Discharge: 2019-07-14 | Disposition: A | Discharge status: Home / Self Care | Payer: 59 | Attending: Family Medicine | Admitting: Family Medicine

## 2019-07-14 DIAGNOSIS — Z20828 Contact with and (suspected) exposure to other viral communicable diseases: Secondary | ICD-10-CM

## 2019-07-14 DIAGNOSIS — R070 Pain in throat: Secondary | ICD-10-CM | POA: Diagnosis present

## 2019-07-14 DIAGNOSIS — R059 Cough, unspecified: Secondary | ICD-10-CM

## 2019-07-14 DIAGNOSIS — J029 Acute pharyngitis, unspecified: Secondary | ICD-10-CM | POA: Diagnosis not present

## 2019-07-14 DIAGNOSIS — R05 Cough: Secondary | ICD-10-CM | POA: Diagnosis present

## 2019-07-14 DIAGNOSIS — I10 Essential (primary) hypertension: Secondary | ICD-10-CM | POA: Diagnosis not present

## 2019-07-14 DIAGNOSIS — U071 COVID-19: Secondary | ICD-10-CM | POA: Insufficient documentation

## 2019-07-14 DIAGNOSIS — Z20822 Contact with and (suspected) exposure to covid-19: Secondary | ICD-10-CM

## 2019-07-14 MED ORDER — AMLODIPINE BESYLATE 5 MG PO TABS: 5.0000 mg | ORAL_TABLET | Freq: Every day | ORAL | 0 refills | Status: DC

## 2019-07-14 MED ORDER — BENZONATATE 100 MG PO CAPS: 100.0000 mg | ORAL_CAPSULE | Freq: Three times a day (TID) | ORAL | 0 refills | Status: DC | PRN

## 2019-07-14 NOTE — Discharge Instructions (Addendum)
We will manage this as a viral syndrome. For sore throat or cough try using a honey-based tea. Use 3 teaspoons of honey with juice squeezed from half lemon. Place shaved pieces of ginger into 1/2-1 cup of water and warm over stove top. Then mix the ingredients and repeat every 4 hours as needed. Please take Tylenol 500mg  every 6 hours. Hydrate very well with at least 2 liters of water. Eat light meals such as soups to replenish electrolytes and soft fruits, veggies. Start an antihistamine like Zyrtec (cetirizine) at 10mg  daily for postnasal drainage, sinus congestion.   For your diabetes, please make sure you are avoiding starchy, carbohydrate foods like pasta, breads, pastry, rice, potatoes, desserts. These foods can elevated your blood sugar. Also, limit your alcohol drinking to 1 per day, avoid sodas, sweet teas. For elevated blood pressure, make sure you are monitoring salt in your diet.  Do not eat restaurant foods and limit processed foods at home.  Processed foods include things like frozen meals preseason meats and dinners.  Make sure your pain attention to sodium labels on foods you by at the grocery store.  For seasoning you can use a brand called Mrs. Dash which includes a lot of salt free seasonings.  Salads - kale, spinach, cabbage, spring mix; use seeds like pumpkin seeds or sunflower seeds, almonds; you can also use 1-2 hard boiled eggs in your salads Fruits - avocadoes, berries (blueberries, raspberries, blackberries), apples, oranges Vegetables - aspargus, cauliflower, broccoli, green beans, brussel spouts, bell peppers; stay away from starchy vegetables like potatoes, carrots, peas  Regarding meat it is better to eat lean meats and limit your red meat consumption including pork.  Wild caught fish, chicken breast are good options.  Do not eat any foods on this list that you are allergic to.

## 2019-07-14 NOTE — ED Notes (Signed)
Notified provider regarding v/s (elevated BP)

## 2019-07-14 NOTE — ED Provider Notes (Signed)
[facility]   MRN: [id number] DOB: 11-26-1970  Subjective:   Lori Sullivan is a 48 y.o. female presenting for 1 to 2-day history of acute onset intermittent mild dry cough, mild sore throat.  She did take Tylenol and ibuprofen with some relief.  Patient had Covid exposure and would like to get tested.  She also has a history of hypertension and is not taking any medications right now for.  She is not opposed to starting something.  No current facility-administered medications for this encounter.  Current Outpatient Medications:  .  atorvastatin (LIPITOR) 40 MG tablet, Take 1 tablet (40 mg total) by mouth daily., Disp: 90 tablet, Rfl: 3 .  cetirizine (ZYRTEC) 10 MG tablet, Take 1 tablet (10 mg total) by mouth daily., Disp: 30 tablet, Rfl: 2 .  gabapentin (NEURONTIN) 100 MG capsule, Take 2 capsules (200 mg total) by mouth 3 (three) times daily., Disp: 90 capsule, Rfl: 1 .  omeprazole (PRILOSEC) 20 MG capsule, TAKE 1 CAPSULE BY MOUTH 2 TIMES DAILY BEFORE A MEAL., Disp: 60 capsule, Rfl: 0 .  polyethylene glycol powder (GLYCOLAX/MIRALAX) powder, Take 17 g by mouth daily., Disp: 500 g, Rfl: 1   No Known Allergies  Past Medical History:  Diagnosis Date  . Blood transfusion without reported diagnosis   . Hypertension    Pt stated she is not sure what HTN meds she was previously taking because her husband takes care of that.     History reviewed. No pertinent surgical history.  History reviewed. No pertinent family history.  Social History   Tobacco Use  . Smoking status: Never Smoker  . Smokeless tobacco: Never Used  Substance Use Topics  . Alcohol use: No  . Drug use: No    Review of Systems  Constitutional: Negative for fever and malaise/fatigue.  HENT: Positive for sore throat. Negative for congestion, ear pain and sinus pain.   Eyes: Negative for discharge and redness.  Respiratory: Positive for cough. Negative for hemoptysis, shortness of breath and wheezing.     Cardiovascular: Negative for chest pain.  Gastrointestinal: Negative for abdominal pain, diarrhea, nausea and vomiting.  Genitourinary: Negative for dysuria, flank pain and hematuria.  Musculoskeletal: Negative for myalgias.  Skin: Negative for rash.  Neurological: Negative for dizziness, weakness and headaches.  Psychiatric/Behavioral: Negative for depression and substance abuse.    Objective:   Vitals: BP (!) 165/71 (BP Location: Left Arm)   Pulse 75   Temp 98.5 F (36.9 C) (Oral)   Resp (!) 158   SpO2 99%   BP Readings from Last 3 Encounters:  07/14/19 (!) 165/71  03/26/19 (!) 150/82  10/08/18 122/75   Physical Exam Constitutional:      General: She is not in acute distress.    Appearance: Normal appearance. She is well-developed. She is not ill-appearing, toxic-appearing or diaphoretic.  HENT:     Head: Normocephalic and atraumatic.     Nose: Nose normal.     Mouth/Throat:     Mouth: Mucous membranes are moist.     Pharynx: No oropharyngeal exudate or posterior oropharyngeal erythema.  Eyes:     General: No scleral icterus.    Extraocular Movements: Extraocular movements intact.     Pupils: Pupils are equal, round, and reactive to light.  Cardiovascular:     Rate and Rhythm: Normal rate and regular rhythm.     Pulses: Normal pulses.     Heart sounds: Normal heart sounds. No murmur. No friction rub. No gallop.  Pulmonary:     Effort: Pulmonary effort is normal. No respiratory distress.     Breath sounds: Normal breath sounds. No stridor. No wheezing, rhonchi or rales.  Musculoskeletal:     Right lower leg: No edema.     Left lower leg: No edema.  Skin:    General: Skin is warm and dry.     Findings: No rash.  Neurological:     Mental Status: She is alert and oriented to person, place, and time.     Cranial Nerves: No cranial nerve deficit.     Motor: No weakness.     Coordination: Coordination normal.     Gait: Gait normal.     Deep Tendon Reflexes:  Reflexes normal.  Psychiatric:        Mood and Affect: Mood normal.        Behavior: Behavior normal.        Thought Content: Thought content normal.        Judgment: Judgment normal.     Assessment and Plan :   1. Exposure to COVID-19 virus   2. Essential hypertension   3. Elevated blood pressure reading in office with diagnosis of hypertension   4. Cough   5. Throat pain     Regarding her hypertension, recommended dietary modifications, start amlodipine at 5 mg.  Recheck with PCP.  Will manage for viral illness such as viral URI, viral rhinitis, possible COVID-19. Counseled patient on nature of COVID-19 including modes of transmission, diagnostic testing, management and supportive care.  Offered symptomatic relief. COVID 19 testing is pending. Counseled patient on potential for adverse effects with medications prescribed/recommended today, ER and return-to-clinic precautions discussed, patient verbalized understanding.     Wallis Bamberg, [place] 07/14/19 [phone]

## 2019-07-14 NOTE — ED Triage Notes (Signed)
Pt presents to the UC for COVID test. Pt denies any signs and symptoms.  

## 2019-07-15 ENCOUNTER — Telehealth: Payer: Self-pay | Admitting: Emergency Medicine

## 2019-07-15 ENCOUNTER — Ambulatory Visit: Payer: Self-pay | Admitting: Family Medicine

## 2019-07-15 LAB — NOVEL CORONAVIRUS, NAA (HOSP ORDER, SEND-OUT TO REF LAB; TAT 18-24 HRS): SARS-CoV-2, NAA: DETECTED — AB

## 2019-07-15 MED FILL — AMLODIPINE BESYLATE 5 MG TA: 5 | 90 days supply | Qty: 90 | Fill #0

## 2019-07-15 MED FILL — BENZONATATE 100 MG CAPS: 100 | 10 days supply | Qty: 60 | Fill #0

## 2019-07-15 NOTE — Telephone Encounter (Signed)
Your test for COVID-19 was positive, meaning that you were infected with the novel coronavirus and could give the germ to others.  Please continue isolation at home for at least 10 days since the start of your symptoms. If you do not have symptoms, please isolate at home for 10 days from the day you were tested. Once you complete your 10 day quarantine, you may return to normal activities as long as you've not had a fever for over 24 hours(without taking fever reducing medicine) and your symptoms are improving. Please continue good preventive care measures, including:  frequent hand-washing, avoid touching your face, cover coughs/sneezes, stay out of crowds and keep a 6 foot distance from others.  Go to the nearest hospital emergency room if fever/cough/breathlessness are severe or illness seems like a threat to life.  Family contacted and made aware, all questions answered.   

## 2019-07-29 ENCOUNTER — Other Ambulatory Visit: Payer: Self-pay | Admitting: *Deleted

## 2019-07-29 MED ORDER — POLYETHYLENE GLYCOL 3350 17 GM/SCOOP PO POWD: 17.0000 g | Freq: Every day | ORAL | 1 refills | Status: DC

## 2019-07-29 MED FILL — SM CLEARLAX POWDER: 17 | 30 days supply | Qty: 510 | Fill #0

## 2019-07-31 DIAGNOSIS — R195 Other fecal abnormalities: Secondary | ICD-10-CM | POA: Diagnosis not present

## 2019-07-31 DIAGNOSIS — K625 Hemorrhage of anus and rectum: Secondary | ICD-10-CM | POA: Diagnosis not present

## 2019-07-31 DIAGNOSIS — R531 Weakness: Secondary | ICD-10-CM | POA: Diagnosis not present

## 2019-07-31 MED FILL — SUPREP BOWEL PREP KIT: 17.5-3.13-1 | 1 days supply | Qty: 354 | Fill #0

## 2019-08-02 ENCOUNTER — Emergency Department (HOSPITAL_COMMUNITY)
Admission: EM | Admit: 2019-08-02 | Discharge: 2019-08-02 | Disposition: A | Discharge status: Home / Self Care | Payer: 59 | Attending: Emergency Medicine | Admitting: Emergency Medicine

## 2019-08-02 ENCOUNTER — Emergency Department (HOSPITAL_COMMUNITY): Payer: 59

## 2019-08-02 ENCOUNTER — Encounter (HOSPITAL_COMMUNITY): Payer: Self-pay

## 2019-08-02 ENCOUNTER — Other Ambulatory Visit: Payer: Self-pay

## 2019-08-02 DIAGNOSIS — I10 Essential (primary) hypertension: Secondary | ICD-10-CM | POA: Diagnosis not present

## 2019-08-02 DIAGNOSIS — K922 Gastrointestinal hemorrhage, unspecified: Secondary | ICD-10-CM | POA: Insufficient documentation

## 2019-08-02 DIAGNOSIS — K921 Melena: Secondary | ICD-10-CM | POA: Diagnosis present

## 2019-08-02 DIAGNOSIS — R109 Unspecified abdominal pain: Secondary | ICD-10-CM | POA: Diagnosis not present

## 2019-08-02 DIAGNOSIS — Z79899 Other long term (current) drug therapy: Secondary | ICD-10-CM | POA: Diagnosis not present

## 2019-08-02 LAB — TYPE AND SCREEN
ABO/RH(D): B POS
Antibody Screen: NEGATIVE

## 2019-08-02 LAB — COMPREHENSIVE METABOLIC PANEL
ALT: 28 U/L (ref 0–44)
AST: 25 U/L (ref 15–41)
Albumin: 4.1 g/dL (ref 3.5–5.0)
Alkaline Phosphatase: 88 U/L (ref 38–126)
Anion gap: 13 (ref 5–15)
BUN: 12 mg/dL (ref 6–20)
CO2: 25 mmol/L (ref 22–32)
Calcium: 9.5 mg/dL (ref 8.9–10.3)
Chloride: 104 mmol/L (ref 98–111)
Creatinine, Ser: 0.67 mg/dL (ref 0.44–1.00)
GFR calc Af Amer: >60 mL/min (ref >60)
GFR calc non Af Amer: >60 mL/min (ref >60)
Glucose, Bld: 97 mg/dL (ref 70–99)
Potassium: 4.5 mmol/L (ref 3.5–5.1)
Sodium: 142 mmol/L (ref 135–145)
Total Bilirubin: 0.6 mg/dL (ref 0.3–1.2)
Total Protein: 7.8 g/dL (ref 6.5–8.1)

## 2019-08-02 LAB — CBC
HCT: 38.4 % (ref 36.0–46.0)
Hemoglobin: 11.7 g/dL — ABNORMAL LOW (ref 12.0–15.0)
MCH: 24.6 pg — ABNORMAL LOW (ref 26.0–34.0)
MCHC: 30.5 g/dL (ref 30.0–36.0)
MCV: 80.8 fL (ref 80.0–100.0)
Platelets: 411 10*3/uL — ABNORMAL HIGH (ref 150–400)
RBC: 4.75 MIL/uL (ref 3.87–5.11)
RDW: 13 % (ref 11.5–15.5)
WBC: 6.6 10*3/uL (ref 4.0–10.5)
nRBC: 0 % (ref 0.0–0.2)

## 2019-08-02 LAB — I-STAT BETA HCG BLOOD, ED (MC, WL, AP ONLY): I-stat hCG, quantitative: <5 m[IU]/mL (ref <5)

## 2019-08-02 LAB — ABO/RH: ABO/RH(D): B POS

## 2019-08-02 LAB — HEMOGLOBIN AND HEMATOCRIT, BLOOD
HCT: 36 % (ref 36.0–46.0)
Hemoglobin: 11.2 g/dL — ABNORMAL LOW (ref 12.0–15.0)

## 2019-08-02 MED ORDER — IOHEXOL 300 MG/ML  SOLN
100.0000 mL | Freq: Once | INTRAMUSCULAR | Status: AC | PRN
  Administered 2019-08-02: 100 mL via INTRAVENOUS

## 2019-08-02 NOTE — ED Triage Notes (Signed)
Pt arrives POV for eval of rectal  Bleeding and hemorrhoids. Pt reports that she has had hemorrhoids for 16 years w/ intermittent scarce bleeding, but Monday she has been having frank blood per rectum w/ clots and filling bowl. Reports she has just been passing frank blood, minimal stool. Endorses lower abd pain/cramping. Pt also mentions L sided sciatic nerve pain which is also bothering her. Pt speaks [place], interpreter used for triage

## 2019-08-02 NOTE — ED Provider Notes (Signed)
[facility] EMERGENCY DEPARTMENT Provider Note   CSN: [id number] Arrival date & time: 08/02/19  1318     History Chief Complaint  Patient presents with  . GI Bleeding    Lori Sullivan is a 49 y.o. female.  The history is provided by the patient and medical records. A language interpreter was used Recruitment consultant interpreter).   Lori Sullivan is a 49 y.o. female who presents to the Emergency Department complaining of hematochezia. Level V caveat due to language barrier. She presents to the emergency department with five days of hematochezia. She has a history of intermittent hematochezia that she relates to hemorrhoids. Over the last five days she is experienced blood he bowel movements twice daily. She describes it as dark and bright red blood mixed together with clots, about half a cup each time she has a bowel movement. There is only a small amount of stool associated with bowel movements. She also has transient abdominal cramping, now resolved. She is scheduled to follow-up with G.I. on Tuesday. She has not had previous evaluation for the symptoms. She does not take any blood thinners. She denies any chest pain, shortness of breath, nausea, vomiting, lightheadedness. She also complains of left side pain. Pain is located in the left hip and radiates down the leg. No reports of injuries. No new numbness, weakness.    Past Medical History:  Diagnosis Date  . Blood transfusion without reported diagnosis   . Hypertension    Pt stated she is not sure what HTN meds she was previously taking because her husband takes care of that.    Patient Active Problem List   Diagnosis Date Noted  . Sciatica of left side 03/28/2019  . Mixed hyperlipidemia 07/12/2018  . Abdominal pain 03/06/2018  . Bilateral leg pain 08/23/2016  . Thyromegaly 01/04/2015  . Lumbar pain 12/05/2014  . Essential hypertension 12/05/2014  . Internal and external bleeding hemorrhoids 02/20/2013  . GERD 05/28/2010   . Constipation 05/28/2010  . RECTAL BLEEDING 05/28/2010    History reviewed. No pertinent surgical history.   OB History    Gravida  1   Para  1   Term  0   Preterm  0   AB  0   Living  0     SAB  0   TAB  0   Ectopic  0   Multiple  0   Live Births  0           History reviewed. No pertinent family history.  Social History   Tobacco Use  . Smoking status: Never Smoker  . Smokeless tobacco: Never Used  Substance Use Topics  . Alcohol use: No  . Drug use: No    Home Medications Prior to Admission medications   Medication Sig Start Date End Date Taking? Authorizing Provider  amLODipine (NORVASC) 5 MG tablet Take 1 tablet (5 mg total) by mouth daily. 07/14/19   Wallis Bamberg, PA-C  atorvastatin (LIPITOR) 40 MG tablet Take 1 tablet (40 mg total) by mouth daily. 03/26/19   Marthenia Rolling, DO  benzonatate (TESSALON) 100 MG capsule Take 1-2 capsules (100-200 mg total) by mouth 3 (three) times daily as needed. 07/14/19   Wallis Bamberg, PA-C  cetirizine (ZYRTEC) 10 MG tablet Take 1 tablet (10 mg total) by mouth daily. 08/07/18   Diallo, Lilia Argue, MD  gabapentin (NEURONTIN) 100 MG capsule Take 2 capsules (200 mg total) by mouth 3 (three) times daily. 03/26/19 04/25/19  Marthenia Rolling,  DO  omeprazole (PRILOSEC) 20 MG capsule TAKE 1 CAPSULE BY MOUTH 2 TIMES DAILY BEFORE A MEAL. 06/06/19   Daisy Floro, DO  polyethylene glycol powder (GLYCOLAX/MIRALAX) 17 GM/SCOOP powder Take 17 g by mouth daily. 07/29/19   Daisy Floro, DO    Allergies    Patient has no known allergies.  Review of Systems   Review of Systems  All other systems reviewed and are negative.   Physical Exam Updated Vital Signs BP 128/82 (BP Location: Right Arm)   Pulse 69   Temp 97.9 F (36.6 C) (Oral)   Resp 18   Ht 5' (1.524 m)   Wt 63.5 kg   SpO2 96%   BMI 27.34 kg/m   Physical Exam Vitals and nursing note reviewed.  Constitutional:      Appearance: She is well-developed.  HENT:      Head: Normocephalic and atraumatic.  Cardiovascular:     Rate and Rhythm: Normal rate and regular rhythm.  Pulmonary:     Effort: Pulmonary effort is normal. No respiratory distress.  Abdominal:     Palpations: Abdomen is soft.     Tenderness: There is no abdominal tenderness. There is no guarding or rebound.  Genitourinary:    Comments: External hemorrhoids that are nontender, non-thrombosed. No gross blood on digital rectal exam Musculoskeletal:        General: No tenderness.     Comments: 2+ DP pulses bilaterally.  Skin:    General: Skin is warm and dry.  Neurological:     Mental Status: She is alert and oriented to person, place, and time.     Comments: Five out of five strength and bilateral lower extremities with sensation to light touch intact and bilateral lower extremities. Left side/hip pain is reproduced on elevation of the left lower extremity with range of motion intact.  Psychiatric:        Behavior: Behavior normal.     ED Results / Procedures / Treatments   Labs (all labs ordered are listed, but only abnormal results are displayed) Labs Reviewed  CBC - Abnormal; Notable for the following components:      Result Value   Hemoglobin 11.7 (*)    MCH 24.6 (*)    Platelets 411 (*)    All other components within normal limits  HEMOGLOBIN AND HEMATOCRIT, BLOOD - Abnormal; Notable for the following components:   Hemoglobin 11.2 (*)    All other components within normal limits  COMPREHENSIVE METABOLIC PANEL  POC OCCULT BLOOD, ED  I-STAT BETA HCG BLOOD, ED ([facility], [facility], [facility] ONLY)  TYPE AND SCREEN  ABO/RH    EKG None  Radiology CT Abdomen Pelvis W Contrast  Result Date: 08/02/2019 CLINICAL DATA:  Abdominal pain and rectal bleeding EXAM: CT ABDOMEN AND PELVIS WITH CONTRAST TECHNIQUE: Multidetector CT imaging of the abdomen and pelvis was performed using the standard protocol following bolus administration of intravenous contrast. CONTRAST:  111mL OMNIPAQUE IOHEXOL 300  MG/ML  SOLN COMPARISON:  03/29/2017 FINDINGS: Lower chest: Lung bases demonstrate patchy peripheral ground-glass opacities consistent with the given clinical history of COVID-19 positivity. Hepatobiliary: Fatty infiltration of the liver is noted. The gallbladder is within normal limits. Pancreas: Unremarkable. No pancreatic ductal dilatation or surrounding inflammatory changes. Spleen: Normal in size without focal abnormality. Adrenals/Urinary Tract: Adrenal glands are within normal limits. Kidneys demonstrate a normal enhancement pattern bilaterally. No renal calculi or obstructive changes are noted. The bladder is decompressed. Stomach/Bowel: The appendix is within normal limits. The colon  shows no obstructive or inflammatory changes. No small bowel abnormality is noted. Stomach is within normal limits. Vascular/Lymphatic: Aortic atherosclerosis. No enlarged abdominal or pelvic lymph nodes. Reproductive: Uterus and bilateral adnexa are unremarkable. Other: No abdominal wall hernia or abnormality. No abdominopelvic ascites. Musculoskeletal: Degenerative changes of lumbar spine are noted. IMPRESSION: Changes in the lung bases particularly on the right consistent with the given clinical history of COVID-19 positivity. No other acute abnormality noted. Electronically Signed   By: Alcide Clever M.D.   On: 08/02/2019 21:17    Procedures Procedures (including critical care time)  Medications Ordered in ED Medications  iohexol (OMNIPAQUE) 300 MG/ML solution 100 mL (100 mLs Intravenous Contrast Given 08/02/19 2052)    ED Course  I have reviewed the triage vital signs and the nursing notes.  Pertinent labs & imaging results that were available during my care of the patient were reviewed by me and considered in my medical decision making (see chart for details).    MDM Rules/Calculators/A&P                     Patient here for evaluation of hematochezia BID for the last five days. No gross blood on rectal  examination. She is mildly anemic in the emergency department. CT scan was obtained, which is negative for diverticulitis or diverticulosis. Discussed with on-call physician for G.I. Patient is scheduled for colonoscopy on Tuesday of this week, recently had outpatient hemoglobin of 13. Hemoglobin is mildly decreased compared to prior but patient is hemodynamically stable. Repeat hemoglobin in the emergency department is minimally decreased compared to prior but within the standard deviation compared to earlier. Discussed with patient option of hospital observation versus close outpatient follow-up and patient is agreeable to outpatient follow-up with close return precautions. All discussions with the patient were done through a language interpreter.  Final Clinical Impression(s) / ED Diagnoses Final diagnoses:  Lower GI bleed    Rx / DC Orders ED Discharge Orders    None       Tilden Fossa, MD 08/02/19 2317

## 2019-08-05 ENCOUNTER — Ambulatory Visit: Payer: Self-pay | Admitting: Advanced Practice Midwife

## 2019-08-06 DIAGNOSIS — R195 Other fecal abnormalities: Secondary | ICD-10-CM | POA: Diagnosis not present

## 2019-08-06 DIAGNOSIS — K648 Other hemorrhoids: Secondary | ICD-10-CM | POA: Diagnosis not present

## 2019-08-06 DIAGNOSIS — K625 Hemorrhage of anus and rectum: Secondary | ICD-10-CM | POA: Diagnosis not present

## 2019-08-06 DIAGNOSIS — K640 First degree hemorrhoids: Secondary | ICD-10-CM | POA: Diagnosis not present

## 2019-08-06 DIAGNOSIS — K921 Melena: Secondary | ICD-10-CM | POA: Diagnosis not present

## 2019-08-06 MED FILL — HYDROCORTISONE ACETATE 25 M: 25 | 7 days supply | Qty: 15 | Fill #0

## 2019-08-07 DIAGNOSIS — K648 Other hemorrhoids: Secondary | ICD-10-CM | POA: Diagnosis not present

## 2019-08-09 ENCOUNTER — Encounter (HOSPITAL_BASED_OUTPATIENT_CLINIC_OR_DEPARTMENT_OTHER): Payer: Self-pay

## 2019-08-09 NOTE — Progress Notes (Signed)
Spoke with patient via jarai interpreter keo eban and patient stated she will call dr white and cancel surgery .hemorrhoids not bleeding anymore

## 2019-08-12 ENCOUNTER — Encounter (HOSPITAL_COMMUNITY): Payer: Self-pay | Admitting: Emergency Medicine

## 2019-08-12 ENCOUNTER — Inpatient Hospital Stay (HOSPITAL_COMMUNITY): Admission: RE | Admit: 2019-08-12 | Payer: 59 | Source: Ambulatory Visit

## 2019-08-12 ENCOUNTER — Emergency Department (HOSPITAL_COMMUNITY)
Admission: EM | Admit: 2019-08-12 | Discharge: 2019-08-12 | Disposition: A | Discharge status: Home / Self Care | Payer: 59 | Attending: Emergency Medicine | Admitting: Emergency Medicine

## 2019-08-12 ENCOUNTER — Other Ambulatory Visit: Payer: Self-pay

## 2019-08-12 ENCOUNTER — Ambulatory Visit: Payer: Self-pay | Admitting: Surgery

## 2019-08-12 DIAGNOSIS — I10 Essential (primary) hypertension: Secondary | ICD-10-CM | POA: Insufficient documentation

## 2019-08-12 DIAGNOSIS — K625 Hemorrhage of anus and rectum: Secondary | ICD-10-CM | POA: Insufficient documentation

## 2019-08-12 DIAGNOSIS — Z79899 Other long term (current) drug therapy: Secondary | ICD-10-CM | POA: Insufficient documentation

## 2019-08-12 DIAGNOSIS — R531 Weakness: Secondary | ICD-10-CM | POA: Diagnosis not present

## 2019-08-12 DIAGNOSIS — K648 Other hemorrhoids: Secondary | ICD-10-CM | POA: Insufficient documentation

## 2019-08-12 LAB — BASIC METABOLIC PANEL
Anion gap: 10 (ref 5–15)
BUN: 12 mg/dL (ref 6–20)
CO2: 26 mmol/L (ref 22–32)
Calcium: 9.2 mg/dL (ref 8.9–10.3)
Chloride: 105 mmol/L (ref 98–111)
Creatinine, Ser: 0.84 mg/dL (ref 0.44–1.00)
GFR calc Af Amer: >60 mL/min (ref >60)
GFR calc non Af Amer: >60 mL/min (ref >60)
Glucose, Bld: 129 mg/dL — ABNORMAL HIGH (ref 70–99)
Potassium: 4 mmol/L (ref 3.5–5.1)
Sodium: 141 mmol/L (ref 135–145)

## 2019-08-12 LAB — CBC
HCT: 26.1 % — ABNORMAL LOW (ref 36.0–46.0)
Hemoglobin: 8.2 g/dL — ABNORMAL LOW (ref 12.0–15.0)
MCH: 25.5 pg — ABNORMAL LOW (ref 26.0–34.0)
MCHC: 31.4 g/dL (ref 30.0–36.0)
MCV: 81.3 fL (ref 80.0–100.0)
Platelets: 364 10*3/uL (ref 150–400)
RBC: 3.21 MIL/uL — ABNORMAL LOW (ref 3.87–5.11)
RDW: 14.5 % (ref 11.5–15.5)
WBC: 7.8 10*3/uL (ref 4.0–10.5)
nRBC: 0.3 % — ABNORMAL HIGH (ref 0.0–0.2)

## 2019-08-12 LAB — TYPE AND SCREEN
ABO/RH(D): B POS
Antibody Screen: NEGATIVE

## 2019-08-12 LAB — POC OCCULT BLOOD, ED: Fecal Occult Bld: POSITIVE — AB

## 2019-08-12 MED ORDER — POLYETHYLENE GLYCOL 3350 17 G PO PACK: 17.0000 g | PACK | Freq: Every day | ORAL | 0 refills | Status: DC

## 2019-08-12 MED ORDER — SODIUM CHLORIDE 0.9% FLUSH: 3.0000 mL | Freq: Once | INTRAVENOUS | Status: DC

## 2019-08-12 NOTE — H&P (Signed)
CC: Intermittent BRBPR  HPI: Ms. Fader is a very pleasant 38yoF no known medical history who for the last couple of weeks has had intermittent bright red blood per rectum associated with bowel movements. She had a hemoglobin of 11 and subsequently followed 8. She was seen by Dr. Benson [place] for urgent endoscopy. She was taken for colonoscopy yesterday. This demonstrated bleeding internal hemorrhoids during retroflexion. Suspected to be left lateral/left posterior lateral location. The bleeding hemorrhoid is just above the dentate. She is not routinely taking any fiber supplement or MiraLAX. They do have a number of packets of clear lax at home. She denies any prior anorectal procedures. Her colonoscopy was completed and demonstrated no other source of bleeding. She denies any anal pain. She denies any significant tissue prolapse. She reports a history of 1 child, SVD. Denies any associated trauma at the birth.  She denies any issues with incontinence to gas, liquid, or solid stool. She does not wear a pad or a diaper.  She is here today with her husband. She was offered a Optometrist but her husband is fluent in [place] and she declined any other Optometrist.  PMH: Denies  PSH: Denies  FHx: Denies FHx of malignancy  Social: Denies use of tobacco/EtOH/drugs  ROS: A comprehensive 10 system review of systems was completed with the patient and pertinent findings as noted above.  The patient is a 49 year old female.   Allergies Emeline Gins, [place]; 08/07/2019 4:21 PM) No Known Drug Allergies [08/06/2019]: Allergies Reconciled   Medication History Emeline Gins, CMA; 08/07/2019 4:21 PM) Anusol-HC (25MG  Suppository, 1 (one) Rectal twice a day as needed for bleeding, Taken starting 08/06/2019) Active. Medications Reconciled    Review of Systems Harrell Gave M. Callahan Peddie MD; 08/07/2019 4:41 PM) General Not Present- Appetite Loss, Chills, Fatigue, Fever, Night Sweats, Weight Gain and  Weight Loss. Skin Not Present- Change in Wart/Mole, Dryness, Hives, Jaundice, New Lesions, Non-Healing Wounds, Rash and Ulcer. HEENT Not Present- Earache, Hearing Loss, Hoarseness, Nose Bleed, Oral Ulcers, Ringing in the Ears, Seasonal Allergies, Sinus Pain, Sore Throat, Visual Disturbances, Wears glasses/contact lenses and Yellow Eyes. Respiratory Not Present- Bloody sputum, Chronic Cough, Difficulty Breathing, Snoring and Wheezing. Breast Not Present- Breast Mass, Breast Pain, Nipple Discharge and Skin Changes. Cardiovascular Not Present- Chest Pain, Difficulty Breathing Lying Down, Leg Cramps, Palpitations, Rapid Heart Rate, Shortness of Breath and Swelling of Extremities. Gastrointestinal Not Present- Abdominal Pain, Bloating, Bloody Stool, Change in Bowel Habits, Chronic diarrhea, Constipation, Difficulty Swallowing, Excessive gas, Gets full quickly at meals, Hemorrhoids, Indigestion, Nausea, Rectal Pain and Vomiting. Female Genitourinary Not Present- Frequency, Nocturia, Painful Urination, Pelvic Pain and Urgency. Musculoskeletal Not Present- Back Pain, Joint Pain, Joint Stiffness, Muscle Pain, Muscle Weakness and Swelling of Extremities. Neurological Not Present- Decreased Memory, Fainting, Headaches, Numbness, Seizures, Tingling, Tremor, Trouble walking and Weakness. Psychiatric Not Present- Anxiety, Bipolar, Change in Sleep Pattern, Depression, Fearful and Frequent crying. Endocrine Not Present- Cold Intolerance, Excessive Hunger, Hair Changes, Heat Intolerance, Hot flashes and New Diabetes. Hematology Not Present- Blood Thinners, Easy Bruising, Excessive bleeding, Gland problems, HIV and Persistent Infections.  Vitals Emeline Gins CMA; 08/07/2019 4:21 PM) 08/07/2019 4:20 PM Weight: 136.4 lb Height: 62in Body Surface Area: 1.62 m Body Mass Index: 24.95 kg/m  Temp.: 97.59F  Pulse: 101 (Regular)  BP: 110/70 (Sitting, Left Arm, Standard)       Physical Exam  Harrell Gave M. Shamara Soza MD; 08/07/2019 4:44 PM) The physical exam findings are as follows: Note:Constitutional: No acute distress; conversant; wearing mask Eyes: Moist  conjunctiva; no lid lag; anicteric sclerae; pupils equal and round Lungs: Normal respiratory effort CV: rrr GI: Abdomen soft, nontender, nondistended Anorectal: small ext hems/decompressed tags; internal hemorrhoids visible on effacement of perianal skin which are chronically prolapsing and therefore mildly ulcerated; due to recent scope and concerns for bleeding on scope insertion, formal anoscopy deferred today. Psychiatric: Appropriate affect **A chaperone, Michel Bickers, was present for this encounter    Assessment & Plan Cristal Deer M. Gurley Climer MD; 08/07/2019 4:49 PM) INTERNAL HEMORRHOID, BLEEDING (K64.8) Story: Ms. Fluegge is a very pleasant 48yoF with bleeding internal hemorrhoids Impression: -We discussed the anatomy and physiology of the GI tract and pathophysiology of hemorrhoids with associated illustrations. I recommended she take a daily fiber supplement (Metamucil/Citrucel/FiberCon/Benefiber/Konsyl) OR daily dose of Miralax, drinks 64 ounces of water per day (8, 8 ounce glasses) and minimize time on commode 2-3 minutes. -Given the degree of bleeding she has had, I do not think that further prolong medical management will ultimately be successful without multiple trips to the emergency room. Therefore we discussed proceeding with hemorrhoidectomy. Given that there are at least 2 columns that may be ulcerated and need to be addressed, would not favor hemorrhoidal banding. -We discussed hemorrhoidectomy + exam under anesthesia, material risks (including, but not limited to, pain, bleeding, infection, scarring, rare cases of pelvic sepsis/infection, need for blood transfusion, damage to anal sphincter, incontinence of gas and/or stool, need for additional procedures, recurrence, pneumonia, heart attack, stroke, death) benefits and  alternatives to surgery were discussed at length. I noted a good probability that the procedure would help improve her symptoms. The patient's questions were answered to her satisfaction, she voiced understanding and elected to proceed with surgery. Additionally, we discussed typical postoperative expectations and the recovery process. This patient encounter took 25 minutes today to perform the following: take history, perform exam, review outside records, counsel the patient on their diagnosis and document encounter, findings & plan in the EHR  Signed by Andria Meuse, MD (08/07/2019 4:49 PM)

## 2019-08-12 NOTE — ED Provider Notes (Signed)
[facility] EMERGENCY DEPARTMENT Provider Note   CSN: [id number] Arrival date & time: 08/12/19  1414     History Chief Complaint  Patient presents with  . Weakness  . Rectal Bleeding    Lori Sullivan is a 49 y.o. female.  HPI 49 year old female presenting to the emergency department for rectal bleeding.  Patient was seen approximately 1 week ago, for similar symptoms, states that she has had continued bleeding from the rectum, also reports increasing pain. Reports fatigue, overall malaise, but otherwise no fevers, abdominal pain or vomiting. Was seen by GI approximately 6 days ago, with referral to surgery on 1/27. Patient states she has had hematochezia and wanted to be evaluated. Also states being constipated as well during this time. No chest pain or SOB, no dyspnea.     Past Medical History:  Diagnosis Date  . Blood transfusion without reported diagnosis   . Hypertension    Pt stated she is not sure what HTN meds she was previously taking because her husband takes care of that.    Patient Active Problem List   Diagnosis Date Noted  . Sciatica of left side 03/28/2019  . Mixed hyperlipidemia 07/12/2018  . Abdominal pain 03/06/2018  . Bilateral leg pain 08/23/2016  . Thyromegaly 01/04/2015  . Lumbar pain 12/05/2014  . Essential hypertension 12/05/2014  . Internal and external bleeding hemorrhoids 02/20/2013  . GERD 05/28/2010  . Constipation 05/28/2010  . RECTAL BLEEDING 05/28/2010    History reviewed. No pertinent surgical history.   OB History    Gravida  1   Para  1   Term  0   Preterm  0   AB  0   Living  0     SAB  0   TAB  0   Ectopic  0   Multiple  0   Live Births  0           No family history on file.  Social History   Tobacco Use  . Smoking status: Never Smoker  . Smokeless tobacco: Never Used  Substance Use Topics  . Alcohol use: No  . Drug use: No    Home Medications Prior to Admission medications     Medication Sig Start Date End Date Taking? Authorizing Provider  amLODipine (NORVASC) 5 MG tablet Take 1 tablet (5 mg total) by mouth daily. 07/14/19   Wallis Bamberg, PA-C  atorvastatin (LIPITOR) 40 MG tablet Take 1 tablet (40 mg total) by mouth daily. 03/26/19   Marthenia Rolling, DO  benzonatate (TESSALON) 100 MG capsule Take 1-2 capsules (100-200 mg total) by mouth 3 (three) times daily as needed. 07/14/19   Wallis Bamberg, PA-C  cetirizine (ZYRTEC) 10 MG tablet Take 1 tablet (10 mg total) by mouth daily. 08/07/18   Diallo, Lilia Argue, MD  gabapentin (NEURONTIN) 100 MG capsule Take 2 capsules (200 mg total) by mouth 3 (three) times daily. 03/26/19 04/25/19  Marthenia Rolling, DO  omeprazole (PRILOSEC) 20 MG capsule TAKE 1 CAPSULE BY MOUTH 2 TIMES DAILY BEFORE A MEAL. 06/06/19   Dollene Cleveland, DO  polyethylene glycol powder (GLYCOLAX/MIRALAX) 17 GM/SCOOP powder Take 17 g by mouth daily. 07/29/19   Dollene Cleveland, DO    Allergies    Patient has no known allergies.  Review of Systems   Review of Systems  Constitutional: Positive for fatigue. Negative for chills and fever.  HENT: Negative for ear pain and sore throat.   Eyes: Negative for pain and visual  disturbance.  Respiratory: Negative for cough and shortness of breath.   Cardiovascular: Negative for chest pain and palpitations.  Gastrointestinal: Positive for anal bleeding. Negative for abdominal pain and vomiting.  Genitourinary: Negative for dysuria and hematuria.  Musculoskeletal: Negative for arthralgias and back pain.  Skin: Negative for color change and rash.  Neurological: Negative for seizures and syncope.  All other systems reviewed and are negative.   Physical Exam Updated Vital Signs BP 123/64   Pulse 84   Temp 98.8 F (37.1 C) (Oral)   Resp (!) 29   SpO2 100%   Physical Exam Vitals and nursing note reviewed. Exam conducted with a chaperone present.  Constitutional:      General: She is not in acute distress.     Appearance: She is well-developed. She is not ill-appearing, toxic-appearing or diaphoretic.  HENT:     Head: Normocephalic and atraumatic.     Right Ear: External ear normal.     Left Ear: External ear normal.     Nose: Nose normal. No congestion.     Mouth/Throat:     Mouth: Mucous membranes are moist.     Pharynx: Oropharynx is clear.  Eyes:     Conjunctiva/sclera: Conjunctivae normal.  Cardiovascular:     Rate and Rhythm: Normal rate and regular rhythm.     Heart sounds: No murmur.  Pulmonary:     Effort: Pulmonary effort is normal. No respiratory distress.     Breath sounds: Normal breath sounds.  Abdominal:     Palpations: Abdomen is soft.     Tenderness: There is no abdominal tenderness. There is no guarding or rebound.     Hernia: No hernia is present.  Genitourinary:    Rectum: Normal.     Comments: Small external hemorrhoid present, non engorged, not thrombosed, no current bleeding, no stool in the vault, no blood per rectum, non tender rectum Musculoskeletal:        General: No swelling or tenderness.     Cervical back: Neck supple.  Skin:    General: Skin is warm and dry.     Capillary Refill: Capillary refill takes less than 2 seconds.     Coloration: Skin is not pale.     Findings: No erythema.  Neurological:     General: No focal deficit present.     Mental Status: She is alert. Mental status is at baseline.  Psychiatric:        Mood and Affect: Mood normal.        Behavior: Behavior normal.     ED Results / Procedures / Treatments   Labs (all labs ordered are listed, but only abnormal results are displayed) Labs Reviewed  BASIC METABOLIC PANEL - Abnormal; Notable for the following components:      Result Value   Glucose, Bld 129 (*)    All other components within normal limits  CBC - Abnormal; Notable for the following components:   RBC 3.21 (*)    Hemoglobin 8.2 (*)    HCT 26.1 (*)    MCH 25.5 (*)    nRBC 0.3 (*)    All other components within  normal limits  POC OCCULT BLOOD, ED - Abnormal; Notable for the following components:   Fecal Occult Bld POSITIVE (*)    All other components within normal limits  TYPE AND SCREEN    EKG EKG Interpretation  Date/Time:  Monday August 12 2019 14:22:14 EST Ventricular Rate:  106 PR Interval:  170 QRS Duration:  86 QT Interval:  344 QTC Calculation: 456 R Axis:   77 Text Interpretation: Sinus tachycardia Nonspecific T wave abnormality No significant change since last tracing Confirmed by Blanchie Dessert [phone]) on 08/12/2019 3:46:11 PM   Radiology No results found.  Procedures Procedures (including critical care time)  Medications Ordered in ED Medications  sodium chloride flush (NS) 0.9 % injection 3 mL (has no administration in time range)    ED Course  I have reviewed the triage vital signs and the nursing notes.  Pertinent labs & imaging results that were available during my care of the patient were reviewed by me and considered in my medical decision making (see chart for details).    MDM Rules/Calculators/A&P                      49 year old female presenting to the ED for rectal bleeding.  On arrival patient was hemodynamically stable, afebrile and well-appearing.  No tachycardia noted.  Patient's abdomen is soft and nontender, see physical exam.  Rectal exam was performed using staff chaperone was present, no obvious bleeding was noted, there was an external hemorrhoid that was present but is nonthrombosed, not engorged, nontender, no blood per rectum on my exam, brown stool.  Patient was recently seen approximately 1 week ago for similar symptoms, discharged home, patient was seen by GI approximately 6 days ago with a colonoscopy performed at that time revealed internal hemorrhoids.  Patient has a follow-up in 2 days with surgery for hemorrhoidectomy.  Will obtain CBC to rule out any worsening anemia.  Patient's abdomen soft nontender, no need for further imaging at this  time.  Patient afebrile well-appearing, doubt infectious etiology, with a benign exam, if CBC is reassuring, likely can follow-up in 2 days with her surgeon.  CBC revealed hemoglobin dropped 3 g, over the past 10 days.  This is somewhat significant however patient continues to be hemodynamically stable, is currently not bleeding, and patient has follow-up in 2 days.  Would not transfuse for hemoglobin of 8.  Patient has an otherwise benign exam, return precautions were given including worsening bleeding, fevers, fatigue, inability to ambulate secondary to fatigue, to return the emergency department.  Given patient's close proximity to follow-up, no need for admission at this time.  This was explained to the patient using an interpreter, she agreed and understood, discharged home in good condition.  The attending physician was present and available for all medical decision making and procedures related to this patient's care.   Final Clinical Impression(s) / ED Diagnoses Final diagnoses:  Rectal bleeding  Weakness    Rx / DC Orders ED Discharge Orders    None       Kizzie Fantasia, MD 08/12/19 1617    Blanchie Dessert, MD 08/12/19 1624

## 2019-08-12 NOTE — Discharge Instructions (Addendum)
Please continue to take miralax for your constipation. Follow up with your surgeon in 2 days. Return to ED for worsening fevers, bleeding, or pain.

## 2019-08-12 NOTE — ED Triage Notes (Signed)
Pt c/o generalized weakness, rectal bleeding, and hemorrhoids.  Seen in ED on 1/15 for same.  Denies pain.

## 2019-08-12 NOTE — Progress Notes (Signed)
Patient was positive for COVID on 07/14/19, patient does not need to be retested for COVID before surgery on 08/15/19

## 2019-08-13 ENCOUNTER — Encounter (HOSPITAL_BASED_OUTPATIENT_CLINIC_OR_DEPARTMENT_OTHER): Payer: Self-pay | Admitting: Surgery

## 2019-08-13 NOTE — Progress Notes (Addendum)
Spoke w/ via phone for pre-op interview--- Pt thru [employer] interpreter , Risuin Ksor, via three way conference call (thru [place] interpreting)  Lab needs dos----    ?PT/ PTT, DOS will need to contact Dr Cliffton Asters (called office and spoke w/  Toniann Fail, triage nurse for dr white office,  Toniann Fail stated dr white of off today. So, unable to contact him to ask / verify his lab orders , if he still wants all of them done or if what is done is ok)         Lab results---  Pt had CBC, CMP, T&S, EKG done 08-12-2019 results in chart/ epic (ED visit) COVID test ------ positive covid test in epic 07-14-2019   Arrive at -------  0930 NPO after ------ MN Medications to take morning of surgery ----- NONE Diabetic medication ----- n/a Patient Special Instructions ----- n/a Pre-Op special Istructions -----  Requested Elissa Lovett interpreter to [place] interpreting via email (copy with chart)  Patient verbalized understanding of instructions that were given at this phone interview. Patient denies shortness of breath, chest pain, fever, cough a this phone interview.   Anesthesia Review:  Pt scheduled for urgent hemorrhoid surgery by dr white. Pt hx HTN, from documentation in epic pt has previously been on bp med., at ED visit 07-14-2019 pt given norvasc 5 mg qd and follow up with pcp. Pt has not followed up with pcp since issue with bleeding hemorrhoids, so per pt last dose approx. 2 wks ago.  Pt positive covid 07-14-2019 with mild cough/ sore throat, stated symptoms resolved in 2 wks and further symptoms since.  Pt denies any cardiac s&s, sob, difficulty breathing.  PCP: [facility] (Dr Alwyn Pea 03-26-2019 epic) Cardiologist :  n/a Chest x-ray :  no EKG : 08-02-2019 epic CT abd. 08-02-2019 epic Echo : no Stress test:  no Cardiac Cath :  no Sleep Study/ CPAP : no Fasting Blood Sugar :      / Checks Blood Sugar -- times a day:    N/A Blood Thinner/ Instructions /Last Dose:  No ASA /  Instructions/ Last Dose :  NO

## 2019-08-14 ENCOUNTER — Ambulatory Visit (HOSPITAL_BASED_OUTPATIENT_CLINIC_OR_DEPARTMENT_OTHER)
Admission: RE | Admit: 2019-08-14 | Discharge: 2019-08-14 | Disposition: A | Discharge status: Home / Self Care | Payer: 59 | Attending: Surgery | Admitting: Surgery

## 2019-08-14 ENCOUNTER — Encounter (HOSPITAL_BASED_OUTPATIENT_CLINIC_OR_DEPARTMENT_OTHER)
Admission: RE | Disposition: A | Discharge status: Home / Self Care | Payer: Self-pay | Source: Home / Self Care | Attending: Surgery

## 2019-08-14 ENCOUNTER — Other Ambulatory Visit: Payer: Self-pay

## 2019-08-14 ENCOUNTER — Encounter (HOSPITAL_BASED_OUTPATIENT_CLINIC_OR_DEPARTMENT_OTHER): Payer: Self-pay | Admitting: Surgery

## 2019-08-14 HISTORY — DX: Other hemorrhoids: K64.8

## 2019-08-14 HISTORY — DX: Mixed hyperlipidemia: E78.2

## 2019-08-14 HISTORY — DX: Gastro-esophageal reflux disease without esophagitis: K21.9

## 2019-08-14 HISTORY — DX: Anemia, unspecified: D64.9

## 2019-08-14 HISTORY — DX: Constipation, unspecified: K59.00

## 2019-08-14 HISTORY — DX: Other fatigue: R53.83

## 2019-08-14 HISTORY — DX: Personal history of COVID-19: Z86.16

## 2019-08-14 SURGERY — EXAM UNDER ANESTHESIA WITH HEMORRHOIDECTOMY
Anesthesia: General

## 2019-08-14 MED ORDER — CHLORHEXIDINE GLUCONATE CLOTH 2 % EX PADS
6.0000 | MEDICATED_PAD | Freq: Once | CUTANEOUS | Status: DC
  Filled 2019-08-14: qty 6

## 2019-08-14 MED ORDER — ACETAMINOPHEN 500 MG PO TABS
1000.0000 mg | ORAL_TABLET | ORAL | Status: DC
  Filled 2019-08-14: qty 2

## 2019-08-14 MED ORDER — LACTATED RINGERS IV SOLN
INTRAVENOUS | Status: DC
  Filled 2019-08-14: qty 1000

## 2019-08-14 MED ORDER — BUPIVACAINE LIPOSOME 1.3 % IJ SUSP
20.0000 mL | Freq: Once | INTRAMUSCULAR | Status: DC
  Filled 2019-08-14: qty 20

## 2019-08-14 SURGICAL SUPPLY — 44 items
BLADE EXTENDED COATED 6.5IN (ELECTRODE) ×1 IMPLANT
BLADE HEX COATED 2.75 (ELECTRODE) ×1 IMPLANT
BLADE SURG 10 STRL SS (BLADE) IMPLANT
BLADE SURG 15 STRL LF DISP TIS (BLADE) IMPLANT
BLADE SURG 15 STRL SS (BLADE)
BRIEF STRETCH FOR OB PAD LRG (UNDERPADS AND DIAPERS) IMPLANT
COVER BACK TABLE 60X90IN (DRAPES) ×1 IMPLANT
COVER MAYO STAND STRL (DRAPES) ×1 IMPLANT
COVER WAND RF STERILE (DRAPES) ×2 IMPLANT
DRAPE LAPAROTOMY 100X72 PEDS (DRAPES) ×1 IMPLANT
DRAPE UTILITY XL STRL (DRAPES) ×1 IMPLANT
DRSG PAD ABDOMINAL 8X10 ST (GAUZE/BANDAGES/DRESSINGS) IMPLANT
ELECT REM PT RETURN 9FT ADLT (ELECTROSURGICAL)
ELECTRODE REM PT RTRN 9FT ADLT (ELECTROSURGICAL) ×1 IMPLANT
GAUZE SPONGE 4X4 12PLY STRL (GAUZE/BANDAGES/DRESSINGS) ×1 IMPLANT
GLOVE BIO SURGEON STRL SZ7.5 (GLOVE) ×1 IMPLANT
GLOVE INDICATOR 8.0 STRL GRN (GLOVE) ×1 IMPLANT
GOWN STRL REUS W/TWL XL LVL3 (GOWN DISPOSABLE) ×1 IMPLANT
KIT SIGMOIDOSCOPE (SET/KITS/TRAYS/PACK) IMPLANT
KIT TURNOVER CYSTO (KITS) ×1 IMPLANT
NEEDLE HYPO 22GX1.5 SAFETY (NEEDLE) ×1 IMPLANT
NS IRRIG 500ML POUR BTL (IV SOLUTION) ×1 IMPLANT
PACK BASIN DAY SURGERY FS (CUSTOM PROCEDURE TRAY) ×1 IMPLANT
PAD ARMBOARD 7.5X6 YLW CONV (MISCELLANEOUS) IMPLANT
PENCIL BUTTON HOLSTER BLD 10FT (ELECTRODE) ×1 IMPLANT
SPONGE SURGIFOAM ABS GEL 100 (HEMOSTASIS) IMPLANT
SPONGE SURGIFOAM ABS GEL 12-7 (HEMOSTASIS) IMPLANT
SUT CHROMIC 2 0 SH (SUTURE) IMPLANT
SUT CHROMIC 3 0 SH 27 (SUTURE) IMPLANT
SUT MNCRL AB 4-0 PS2 18 (SUTURE) IMPLANT
SUT VIC AB 2-0 SH 27 (SUTURE)
SUT VIC AB 2-0 SH 27XBRD (SUTURE) IMPLANT
SUT VIC AB 3-0 SH 27 (SUTURE)
SUT VIC AB 3-0 SH 27X BRD (SUTURE) IMPLANT
SUT VIC AB 4-0 P-3 18XBRD (SUTURE) IMPLANT
SUT VIC AB 4-0 P3 18 (SUTURE)
SUT VIC AB 4-0 SH 18 (SUTURE) IMPLANT
SUT VIC AB 4-0 SH 27 (SUTURE)
SUT VIC AB 4-0 SH 27XANBCTRL (SUTURE) IMPLANT
SYR CONTROL 10ML LL (SYRINGE) ×1 IMPLANT
TRAY DSU PREP LF (CUSTOM PROCEDURE TRAY) ×1 IMPLANT
TUBE CONNECTING 12X1/4 (SUCTIONS) ×1 IMPLANT
WATER STERILE IRR 500ML POUR (IV SOLUTION) IMPLANT
YANKAUER SUCT BULB TIP NO VENT (SUCTIONS) ×1 IMPLANT

## 2019-08-14 NOTE — Progress Notes (Addendum)
Pt afraid to have procedure. Has taken an herbal supplement and bleeding has stopped. Would like to talk wit Dr. Cliffton Asters before completing consent for surgery. Message left for Dr. Cliffton Asters with these concerns. 1140 Dr. Cliffton Asters in to see preprocedure. Continues to have fear around the procedure. Spoke at length via interpreter all morning. Procedure cancelled at pt request. Pt dressed, explained to husband and discharged.

## 2019-08-14 NOTE — Progress Notes (Signed)
With assistance of a Systems developer (Mr. Alwyn Pea), I discussed her case again with her today. We discussed rational for surgery for her hemorrhoids which she has had significant bleeding from - hemoglobin having fallen to 8 from 13 over the course of 4 days. She stated to me she did not want to proceed with hemorrhoidectomy and that her bleeding has resolved. We discussed potential for significant bleeding in coming days/weeks/months. We discussed surgery as most definitive option to prevent this from occurring. She remains concerned about pain following surgery and undergoing the procedure in general. We provided reassurance that things would be sore but controlled. She remains adamant that she does not want surgery and acknowledged risk of ongoing bleeding. We have reviewed fiber/water recommendations as well. At her request, her procedure has been canceled.  Marin Olp, M.D. [facility]

## 2019-08-14 NOTE — Anesthesia Preprocedure Evaluation (Deleted)
Anesthesia Evaluation  Patient identified by MRN, date of birth, ID band  Airway        Dental   Pulmonary           Cardiovascular hypertension,      Neuro/Psych    GI/Hepatic GERD  ,  Endo/Other    Renal/GU      Musculoskeletal   Abdominal   Peds  Hematology  (+) anemia ,   Anesthesia Other Findings   Reproductive/Obstetrics                             Anesthesia Physical Anesthesia Plan Anesthesia Quick Evaluation

## 2019-08-21 ENCOUNTER — Emergency Department (HOSPITAL_COMMUNITY)
Admission: EM | Admit: 2019-08-21 | Discharge: 2019-08-21 | Disposition: A | Discharge status: Home / Self Care | Payer: 59 | Attending: Emergency Medicine | Admitting: Emergency Medicine

## 2019-08-21 ENCOUNTER — Encounter (HOSPITAL_COMMUNITY): Payer: Self-pay

## 2019-08-21 ENCOUNTER — Other Ambulatory Visit: Payer: Self-pay

## 2019-08-21 DIAGNOSIS — I10 Essential (primary) hypertension: Secondary | ICD-10-CM | POA: Insufficient documentation

## 2019-08-21 DIAGNOSIS — Z8616 Personal history of COVID-19: Secondary | ICD-10-CM | POA: Diagnosis not present

## 2019-08-21 DIAGNOSIS — Z79899 Other long term (current) drug therapy: Secondary | ICD-10-CM | POA: Insufficient documentation

## 2019-08-21 DIAGNOSIS — K648 Other hemorrhoids: Secondary | ICD-10-CM | POA: Insufficient documentation

## 2019-08-21 DIAGNOSIS — K625 Hemorrhage of anus and rectum: Secondary | ICD-10-CM | POA: Diagnosis present

## 2019-08-21 LAB — CBC WITH DIFFERENTIAL/PLATELET
Abs Immature Granulocytes: 0.03 10*3/uL (ref 0.00–0.07)
Basophils Absolute: 0.1 10*3/uL (ref 0.0–0.1)
Basophils Relative: 1 %
Eosinophils Absolute: 0.2 10*3/uL (ref 0.0–0.5)
Eosinophils Relative: 3 %
HCT: 30.9 % — ABNORMAL LOW (ref 36.0–46.0)
Hemoglobin: 9 g/dL — ABNORMAL LOW (ref 12.0–15.0)
Immature Granulocytes: 1 %
Lymphocytes Relative: 28 %
Lymphs Abs: 1.8 10*3/uL (ref 0.7–4.0)
MCH: 23.5 pg — ABNORMAL LOW (ref 26.0–34.0)
MCHC: 29.1 g/dL — ABNORMAL LOW (ref 30.0–36.0)
MCV: 80.7 fL (ref 80.0–100.0)
Monocytes Absolute: 0.6 10*3/uL (ref 0.1–1.0)
Monocytes Relative: 9 %
Neutro Abs: 3.8 10*3/uL (ref 1.7–7.7)
Neutrophils Relative %: 58 %
Platelets: 442 10*3/uL — ABNORMAL HIGH (ref 150–400)
RBC: 3.83 MIL/uL — ABNORMAL LOW (ref 3.87–5.11)
RDW: 15.5 % (ref 11.5–15.5)
WBC: 6.4 10*3/uL (ref 4.0–10.5)
nRBC: 0 % (ref 0.0–0.2)

## 2019-08-21 LAB — BASIC METABOLIC PANEL
Anion gap: 8 (ref 5–15)
BUN: 9 mg/dL (ref 6–20)
CO2: 26 mmol/L (ref 22–32)
Calcium: 9.2 mg/dL (ref 8.9–10.3)
Chloride: 104 mmol/L (ref 98–111)
Creatinine, Ser: 0.75 mg/dL (ref 0.44–1.00)
GFR calc Af Amer: >60 mL/min (ref >60)
GFR calc non Af Amer: >60 mL/min (ref >60)
Glucose, Bld: 87 mg/dL (ref 70–99)
Potassium: 4.2 mmol/L (ref 3.5–5.1)
Sodium: 138 mmol/L (ref 135–145)

## 2019-08-21 LAB — TYPE AND SCREEN
ABO/RH(D): B POS
Antibody Screen: NEGATIVE

## 2019-08-21 MED ORDER — SODIUM CHLORIDE 0.9 % IV BOLUS
1000.0000 mL | Freq: Once | INTRAVENOUS | Status: AC
  Administered 2019-08-21: 1000 mL via INTRAVENOUS

## 2019-08-21 MED ORDER — HYDROCORTISONE ACETATE 25 MG RE SUPP: 25.0000 mg | Freq: Two times a day (BID) | RECTAL | 0 refills | Status: DC | PRN

## 2019-08-21 MED ORDER — POLYETHYLENE GLYCOL 3350 17 G PO PACK: 17.0000 g | PACK | Freq: Every day | ORAL | 0 refills | Status: DC

## 2019-08-21 MED ORDER — DOCUSATE SODIUM 100 MG PO CAPS: 100.0000 mg | ORAL_CAPSULE | Freq: Two times a day (BID) | ORAL | 0 refills | Status: DC

## 2019-08-21 MED FILL — HYDROCORTISONE ACETATE 25 M: 25 | 6 days supply | Qty: 12 | Fill #0

## 2019-08-21 NOTE — ED Triage Notes (Signed)
Called for [facility] interpreter, awaiting return call.

## 2019-08-21 NOTE — ED Provider Notes (Signed)
[facility] EMERGENCY DEPARTMENT Provider Note   CSN: [id number] Arrival date & time: 08/21/19  1107     History Chief Complaint  Patient presents with  . Rectal Bleeding    Lori Sullivan is a 49 y.o. female.  HPI 49 year old female presents with recurrent rectal bleeding.  Translated over the phone by [facility] interpreter. Started last night.  Has blood with bowel movements.  No rectal pain, abdominal pain.  Feels a little lightheaded and fatigued.  Diagnosed with internal hemorrhoids but canceled the surgery last week.  She is now reconsidering.  No chest pain or vomiting.  Does feel little nauseated.  Not on blood thinners.   Past Medical History:  Diagnosis Date  . Anemia   . Constipation   . Fatigue   . GERD (gastroesophageal reflux disease)   . History of 2019 novel coronavirus disease (COVID-19) (08-13-2019  per pt only mild cough/ sore throat and resolved in 2 wks, no further symptoms   in epic ED visit 07-14-2019 ,pt exposed , test positive covid w/ mild dry cough and mild sore throat (no fever, sob, difficulty breathing, body aches, headache)  . Hypertension 08-13-2019  in epic ED visit 07-14-2019 pt given norvasc rx for HTN, per note she was to follow up with her pcp which she has not done due bleeding hemorrhoids,  pt stated last dose approx. 2 wks ago   Pt stated she is not sure what HTN meds she was previously taking because her husband takes care of that.  . Internal hemorrhoid, bleeding   . Mixed hyperlipidemia     Patient Active Problem List   Diagnosis Date Noted  . Sciatica of left side 03/28/2019  . Mixed hyperlipidemia 07/12/2018  . Abdominal pain 03/06/2018  . Bilateral leg pain 08/23/2016  . Thyromegaly 01/04/2015  . Lumbar pain 12/05/2014  . Essential hypertension 12/05/2014  . Internal and external bleeding hemorrhoids 02/20/2013  . GERD 05/28/2010  . Constipation 05/28/2010  . RECTAL BLEEDING 05/28/2010    Past Surgical  History:  Procedure Laterality Date  . COLONOSCOPY  08-11-2019    DR HUNG  (PER DR WHITE H&P IN EPIC DATED 08-12-2019)     OB History    Gravida  1   Para  1   Term  0   Preterm  0   AB  0   Living  0     SAB  0   TAB  0   Ectopic  0   Multiple  0   Live Births  0           No family history on file.  Social History   Tobacco Use  . Smoking status: Never Smoker  . Smokeless tobacco: Never Used  Substance Use Topics  . Alcohol use: No  . Drug use: No    Home Medications Prior to Admission medications   Medication Sig Start Date End Date Taking? Authorizing Provider  amLODipine (NORVASC) 5 MG tablet Take 1 tablet (5 mg total) by mouth daily. 07/14/19   Wallis Bamberg, PA-C  Ascorbic Acid (VITAMIN C PO) Take by mouth daily.    [provider]  atorvastatin (LIPITOR) 40 MG tablet Take 1 tablet (40 mg total) by mouth daily. Patient not taking: Reported on 08/13/2019 03/26/19   Marthenia Rolling, DO  benzonatate (TESSALON) 100 MG capsule Take 1-2 capsules (100-200 mg total) by mouth 3 (three) times daily as needed. Patient not taking: Reported on 08/13/2019 07/14/19  Wallis Bamberg, PA-C  cetirizine (ZYRTEC) 10 MG tablet Take 1 tablet (10 mg total) by mouth daily. Patient not taking: Reported on 08/13/2019 08/07/18   Lovena Neighbours, MD  docusate sodium (COLACE) 100 MG capsule Take 1 capsule (100 mg total) by mouth every 12 (twelve) hours. 08/21/19   Pricilla Loveless, MD  Ensure (ENSURE) Take 237 mLs by mouth daily.    [provider]  gabapentin (NEURONTIN) 100 MG capsule Take 2 capsules (200 mg total) by mouth 3 (three) times daily. Patient not taking: Reported on 08/13/2019 03/26/19 04/25/19  Marthenia Rolling, DO  hydrocortisone (ANUSOL-HC) 25 MG suppository Place 1 suppository (25 mg total) rectally 2 (two) times daily as needed for hemorrhoids or anal itching. 08/21/19   Pricilla Loveless, MD  Multiple Vitamin (MULTIVITAMIN) tablet Take 1 tablet by mouth daily.     [provider]  omeprazole (PRILOSEC) 20 MG capsule TAKE 1 CAPSULE BY MOUTH 2 TIMES DAILY BEFORE A MEAL. Patient not taking: Reported on 08/13/2019 06/06/19   Peggyann Shoals C, DO  polyethylene glycol (MIRALAX) 17 g packet Take 17 g by mouth daily. 08/21/19   Pricilla Loveless, MD    Allergies    Patient has no known allergies.  Review of Systems   Review of Systems  Constitutional: Positive for fatigue.  Respiratory: Negative for shortness of breath.   Cardiovascular: Negative for chest pain.  Gastrointestinal: Positive for blood in stool. Negative for abdominal pain, rectal pain and vomiting.  Neurological: Positive for light-headedness.  All other systems reviewed and are negative.   Physical Exam Updated Vital Signs BP 126/61 (BP Location: Left Arm)   Pulse 78   Temp 98.8 F (37.1 C) (Oral)   Resp 14   SpO2 100%   Physical Exam Vitals and nursing note reviewed. Exam conducted with a chaperone present.  Constitutional:      Appearance: She is well-developed.  HENT:     Head: Normocephalic and atraumatic.     Right Ear: External ear normal.     Left Ear: External ear normal.     Nose: Nose normal.  Eyes:     General:        Right eye: No discharge.        Left eye: No discharge.  Cardiovascular:     Rate and Rhythm: Normal rate and regular rhythm.     Heart sounds: Normal heart sounds.  Pulmonary:     Effort: Pulmonary effort is normal.     Breath sounds: Normal breath sounds.  Abdominal:     General: There is no distension.     Palpations: Abdomen is soft.     Tenderness: There is no abdominal tenderness.  Genitourinary:    Comments: Mild external hemorrhoids/skin tags. No thrombosed hemorrhoids. No significant tenderness or masses on DRE. No gross blood. Skin:    General: Skin is warm and dry.  Neurological:     Mental Status: She is alert.  Psychiatric:        Mood and Affect: Mood is not anxious.     ED Results / Procedures / Treatments     Labs (all labs ordered are listed, but only abnormal results are displayed) Labs Reviewed  CBC WITH DIFFERENTIAL/PLATELET - Abnormal; Notable for the following components:      Result Value   RBC 3.83 (*)    Hemoglobin 9.0 (*)    HCT 30.9 (*)    MCH 23.5 (*)    MCHC 29.1 (*)    Platelets 442 (*)  All other components within normal limits  BASIC METABOLIC PANEL  TYPE AND SCREEN    EKG None  Radiology No results found.  Procedures Procedures (including critical care time)  Medications Ordered in ED Medications  sodium chloride 0.9 % bolus 1,000 mL (0 mLs Intravenous Stopped 08/21/19 1526)    ED Course  I have reviewed the triage vital signs and the nursing notes.  Pertinent labs & imaging results that were available during my care of the patient were reviewed by me and considered in my medical decision making (see chart for details).    MDM Rules/Calculators/A&P                      Patient's hemoglobin is a little improved since last week.  I discussed with Brigid Re of general surgery who will help arrange for outpatient follow-up in surgery clinic.  Vital signs stable.  Discussed plan through interpreter line. Final Clinical Impression(s) / ED Diagnoses Final diagnoses:  Internal hemorrhoid, bleeding    Rx / DC Orders ED Discharge Orders         Ordered    hydrocortisone (ANUSOL-HC) 25 MG suppository  2 times daily PRN     08/21/19 1515    polyethylene glycol (MIRALAX) 17 g packet  Daily     08/21/19 1515    docusate sodium (COLACE) 100 MG capsule  Every 12 hours     08/21/19 1515           Sherwood Gambler, MD 08/21/19 [phone]

## 2019-08-21 NOTE — ED Triage Notes (Signed)
Per translator: Pt is having blood from her rectum, was previously seen at [facility] for the same. Saw the blood last night and this morning when she had a bowel movement. Pt has been a little dizzy and a little tired. Denies any rectal pain. Pt was previously suggested to have surgery and declined, pt states that she has changed her mind and wants the surgery. Denies abdominal pain. Denies vomiting, but did feel nauseated this morning.

## 2019-08-21 NOTE — ED Notes (Signed)
Translator used to go over discharge papers.  

## 2019-08-28 DIAGNOSIS — K648 Other hemorrhoids: Secondary | ICD-10-CM | POA: Diagnosis not present

## 2019-08-31 ENCOUNTER — Other Ambulatory Visit (HOSPITAL_COMMUNITY): Payer: 59

## 2019-08-31 NOTE — Progress Notes (Signed)
Patient was positive for covid on 07/14/19 no retest needed before surgery on 12/17

## 2019-09-02 ENCOUNTER — Other Ambulatory Visit: Payer: Self-pay

## 2019-09-02 ENCOUNTER — Ambulatory Visit: Payer: Self-pay | Admitting: Surgery

## 2019-09-02 ENCOUNTER — Encounter (HOSPITAL_BASED_OUTPATIENT_CLINIC_OR_DEPARTMENT_OTHER): Payer: Self-pay | Admitting: Surgery

## 2019-09-02 NOTE — Progress Notes (Addendum)
Spoke w/ via phone for pre-op interview--- Lori Sullivan with [employer] interpreter liz Lab needs dos----    Cbc with dif, cmet, pt, ptt, type and screen (patient stated unable to come for pre op for blood work today or tomorrow)         Lab results------ekg 08-12-2019 epic COVID test ------positive covid test 07-13-2020 epic, patient stated having fever, loss of taste and smell, patient quarantine was x 14 days and all symptoms resolved during quarantine.  Arrive at -------900 am 09-04-2019 NPO after ------midnight Medications to take morning of surgery -----none Diabetic medication -----n/a Patient Special Instructions ----- Pre-Op special Istructions -----requested Venda Rodes interpreter via email for day of surgery.addendum: email confirmation for [employer] interpreter day of surgery received and placed on chart. Patient verbalized understanding of instructions that were given at this phone interview. Patient denies shortness of breath, chest pain, fever, cough a this phone interview.

## 2019-09-02 NOTE — H&P (Signed)
CC: Intermittent BRBPR  HPI: Lori Sullivan is a very pleasant 49yoF no known medical history who for the last couple of weeks has had intermittent bright red blood per rectum associated with bowel movements. She had a hemoglobin of 11 and subsequently followed 8. She was seen by Dr. Elnoria Howard for urgent endoscopy. She was taken for colonoscopy yesterday. This demonstrated bleeding internal hemorrhoids during retroflexion. Suspected to be left lateral/left posterior lateral location. The bleeding hemorrhoid is just above the dentate. She is not routinely taking any fiber supplement or MiraLAX. They do have a number of packets of clear lax at home. She denies any prior anorectal procedures. Her colonoscopy was completed and demonstrated no other source of bleeding. She denies any anal pain. She denies any significant tissue prolapse. She reports a history of 1 child, SVD. Denies any associated trauma at the birth.  She denies any issues with incontinence to gas, liquid, or solid stool. She does not wear a pad or a diaper.  She is here today with her husband. She was offered a Nurse, learning disability but her husband is fluent in [place] and she declined any other Nurse, learning disability.  INTERVAL HX Visit was accomplished today with the use of a phone interpreter using [employer] services. Her husband was present in person as well.  She was scheduled for surgery for her bleeding hemorrhoids. On the day of her procedure, she reported her bleeding had stopped and we still recommend she proceed with surgery. She elected to cancel her surgery in preop. This was done with medical interpreter present in person as well. She then began to have bleeding again from her internal hemorrhoids and returns today for follow-up because she has changed her mind and would like surgery now. Has had recurrent issues with bright red blood per rectum. This is assisted bowel movements and stops in between. She denies issues with  syncopal-like symptoms or dizziness with ambulation.  PMH: Denies  PSH: Denies  FHx: Denies FHx of malignancy  Social: Denies use of tobacco/EtOH/drugs  ROS: A comprehensive 10 system review of systems was completed with the patient and pertinent findings as noted above.  The patient is a 49 year old female.   Allergies Maurilio Lovely, CMA; 08/28/2019 10:37 AM) No Known Drug Allergies [08/06/2019]: Allergies Reconciled   Medication History Maurilio Lovely, CMA; 08/28/2019 10:37 AM) No Current Medications Medications Reconciled    Review of Systems Cristal Deer M. Malaina Mortellaro MD; 08/28/2019 11:00 AM) General Not Present- Appetite Loss, Chills, Fatigue, Fever, Night Sweats, Weight Gain and Weight Loss. Skin Not Present- Change in Wart/Mole, Dryness, Hives, Jaundice, New Lesions, Non-Healing Wounds, Rash and Ulcer. HEENT Not Present- Earache, Hearing Loss, Hoarseness, Nose Bleed, Oral Ulcers, Ringing in the Ears, Seasonal Allergies, Sinus Pain, Sore Throat, Visual Disturbances, Wears glasses/contact lenses and Yellow Eyes. Respiratory Not Present- Bloody sputum, Chronic Cough, Difficulty Breathing, Snoring and Wheezing. Breast Not Present- Breast Mass, Breast Pain, Nipple Discharge and Skin Changes. Cardiovascular Not Present- Chest Pain, Difficulty Breathing Lying Down, Leg Cramps, Palpitations, Rapid Heart Rate, Shortness of Breath and Swelling of Extremities. Gastrointestinal Not Present- Abdominal Pain, Bloating, Bloody Stool, Change in Bowel Habits, Chronic diarrhea, Constipation, Difficulty Swallowing, Excessive gas, Gets full quickly at meals, Hemorrhoids, Indigestion, Nausea, Rectal Pain and Vomiting. Female Genitourinary Not Present- Frequency, Nocturia, Painful Urination, Pelvic Pain and Urgency. Musculoskeletal Not Present- Back Pain, Joint Pain, Joint Stiffness, Muscle Pain, Muscle Weakness and Swelling of Extremities. Neurological Not Present- Decreased Memory, Fainting,  Headaches, Numbness, Seizures, Tingling, Tremor, Trouble walking and  Weakness. Psychiatric Not Present- Anxiety, Bipolar, Change in Sleep Pattern, Depression, Fearful and Frequent crying. Endocrine Not Present- Cold Intolerance, Excessive Hunger, Hair Changes, Heat Intolerance, Hot flashes and New Diabetes. Hematology Not Present- Blood Thinners, Easy Bruising, Excessive bleeding, Gland problems, HIV and Persistent Infections.  Vitals (Angela Holmes CMA; 08/28/2019 10:38 AM) 08/28/2019 10:37 AM Weight: 133.8 lb Height: 62in Body Surface Area: 1.61 m Body Mass Index: 24.47 kg/m  Temp.: 98F(Tympanic)  Pulse: 110 (Regular)  BP: 112/62 (Sitting, Left Arm, Standard)       Physical Exam (Austine Kelsay M. Gerica Koble MD; 08/28/2019 11:00 AM) The physical exam findings are as follows: Note:Constitutional: No acute distress; conversant; wearing mask Eyes: Moist conjunctiva; no lid lag; anicteric sclerae; pupils equal and round Lungs: Normal respiratory effort CV: rrr GI: Abdomen soft, nontender, nondistended Anorectal: small ext hems/decompressed tags; internal hemorrhoids visible on effacement of perianal skin which are chronically prolapsing and therefore mildly ulcerated Psychiatric: Appropriate affect **A chaperone, Jacqueline Haggett, was present for this encounter    Assessment & Plan (Torsha Lemus M. Joetta Delprado MD; 08/28/2019 11:03 AM) INTERNAL HEMORRHOID, BLEEDING (K64.8) Story: Lori Sullivan is a very pleasant 49yoF with bleeding internal hemorrhoids - arrived on the day of her surgery and canceled-thyroidectomy-as she thought her symptoms had resolved and she did not want to pursue any surgery. We discussed that her symptoms are likely recur. They did. She returns today because she has changed her mind and decided she would like to proceed with surgery. Impression: -The anatomy and physiology of the anal canal was discussed at length with the patient. The pathophysiology of hemorrhoids  was discussed at length again today -We have again re-discussed hemorrhoidectomy with anorectal exam under anesthesia -The planned procedure, material risks (including, but not limited to, pain, bleeding, infection and rare cases of perineal sepsis, scarring, anal stenosis, need for blood transfusion, damage to anal sphincter, incontinence of gas and/or stool, need for additional procedures, recurrence, pneumonia, heart attack, stroke, death) benefits and alternatives to surgery were discussed at length. I noted a good probability that the procedure would help improve her symptoms. The patient's questions were answered to her satisfaction, she voiced understanding and elected to proceed with surgery. Additionally, we discussed typical postoperative expectations and the recovery process. This patient encounter took 30 minutes today to perform the following: take history, perform exam, review outside records, counsel the patient on their diagnosis and document encounter, findings & plan in the EHR  Signed by Tanishi Nault M Threasa Kinch, MD (08/28/2019 11:03 AM) 

## 2019-09-02 NOTE — H&P (View-Only) (Signed)
CC: Intermittent BRBPR  HPI: Lori Sullivan is a very pleasant 49yoF no known medical history who for the last couple of weeks has had intermittent bright red blood per rectum associated with bowel movements. She had a hemoglobin of 11 and subsequently followed 8. She was seen by Dr. Elnoria Howard for urgent endoscopy. She was taken for colonoscopy yesterday. This demonstrated bleeding internal hemorrhoids during retroflexion. Suspected to be left lateral/left posterior lateral location. The bleeding hemorrhoid is just above the dentate. She is not routinely taking any fiber supplement or MiraLAX. They do have a number of packets of clear lax at home. She denies any prior anorectal procedures. Her colonoscopy was completed and demonstrated no other source of bleeding. She denies any anal pain. She denies any significant tissue prolapse. She reports a history of 1 child, SVD. Denies any associated trauma at the birth.  She denies any issues with incontinence to gas, liquid, or solid stool. She does not wear a pad or a diaper.  She is here today with her husband. She was offered a Nurse, learning disability but her husband is fluent in [place] and she declined any other Nurse, learning disability.  INTERVAL HX Visit was accomplished today with the use of a phone interpreter using [employer] services. Her husband was present in person as well.  She was scheduled for surgery for her bleeding hemorrhoids. On the day of her procedure, she reported her bleeding had stopped and we still recommend she proceed with surgery. She elected to cancel her surgery in preop. This was done with medical interpreter present in person as well. She then began to have bleeding again from her internal hemorrhoids and returns today for follow-up because she has changed her mind and would like surgery now. Has had recurrent issues with bright red blood per rectum. This is assisted bowel movements and stops in between. She denies issues with  syncopal-like symptoms or dizziness with ambulation.  PMH: Denies  PSH: Denies  FHx: Denies FHx of malignancy  Social: Denies use of tobacco/EtOH/drugs  ROS: A comprehensive 10 system review of systems was completed with the patient and pertinent findings as noted above.  The patient is a 49 year old female.   Allergies Maurilio Lovely, CMA; 08/28/2019 10:37 AM) No Known Drug Allergies [08/06/2019]: Allergies Reconciled   Medication History Maurilio Lovely, CMA; 08/28/2019 10:37 AM) No Current Medications Medications Reconciled    Review of Systems Cristal Deer M. Derotha Fishbaugh MD; 08/28/2019 11:00 AM) General Not Present- Appetite Loss, Chills, Fatigue, Fever, Night Sweats, Weight Gain and Weight Loss. Skin Not Present- Change in Wart/Mole, Dryness, Hives, Jaundice, New Lesions, Non-Healing Wounds, Rash and Ulcer. HEENT Not Present- Earache, Hearing Loss, Hoarseness, Nose Bleed, Oral Ulcers, Ringing in the Ears, Seasonal Allergies, Sinus Pain, Sore Throat, Visual Disturbances, Wears glasses/contact lenses and Yellow Eyes. Respiratory Not Present- Bloody sputum, Chronic Cough, Difficulty Breathing, Snoring and Wheezing. Breast Not Present- Breast Mass, Breast Pain, Nipple Discharge and Skin Changes. Cardiovascular Not Present- Chest Pain, Difficulty Breathing Lying Down, Leg Cramps, Palpitations, Rapid Heart Rate, Shortness of Breath and Swelling of Extremities. Gastrointestinal Not Present- Abdominal Pain, Bloating, Bloody Stool, Change in Bowel Habits, Chronic diarrhea, Constipation, Difficulty Swallowing, Excessive gas, Gets full quickly at meals, Hemorrhoids, Indigestion, Nausea, Rectal Pain and Vomiting. Female Genitourinary Not Present- Frequency, Nocturia, Painful Urination, Pelvic Pain and Urgency. Musculoskeletal Not Present- Back Pain, Joint Pain, Joint Stiffness, Muscle Pain, Muscle Weakness and Swelling of Extremities. Neurological Not Present- Decreased Memory, Fainting,  Headaches, Numbness, Seizures, Tingling, Tremor, Trouble walking and  Weakness. Psychiatric Not Present- Anxiety, Bipolar, Change in Sleep Pattern, Depression, Fearful and Frequent crying. Endocrine Not Present- Cold Intolerance, Excessive Hunger, Hair Changes, Heat Intolerance, Hot flashes and New Diabetes. Hematology Not Present- Blood Thinners, Easy Bruising, Excessive bleeding, Gland problems, HIV and Persistent Infections.  Vitals Sabino Gasser CMA; 08/28/2019 10:38 AM) 08/28/2019 10:37 AM Weight: 133.8 lb Height: 62in Body Surface Area: 1.61 m Body Mass Index: 24.47 kg/m  Temp.: 60F(Tympanic)  Pulse: 110 (Regular)  BP: 112/62 (Sitting, Left Arm, Standard)       Physical Exam Harrell Gave M. Jora Galluzzo MD; 08/28/2019 11:00 AM) The physical exam findings are as follows: Note:Constitutional: No acute distress; conversant; wearing mask Eyes: Moist conjunctiva; no lid lag; anicteric sclerae; pupils equal and round Lungs: Normal respiratory effort CV: rrr GI: Abdomen soft, nontender, nondistended Anorectal: small ext hems/decompressed tags; internal hemorrhoids visible on effacement of perianal skin which are chronically prolapsing and therefore mildly ulcerated Psychiatric: Appropriate affect **A chaperone, Marguarite Arbour, was present for this encounter    Assessment & Plan Harrell Gave M. Nolie Bignell MD; 08/28/2019 11:03 AM) INTERNAL HEMORRHOID, BLEEDING (K64.8) Story: [name] is a very pleasant 49yoF with bleeding internal hemorrhoids - arrived on the day of her surgery and canceled-thyroidectomy-as she thought her symptoms had resolved and she did not want to pursue any surgery. We discussed that her symptoms are likely recur. They did. She returns today because she has changed her mind and decided she would like to proceed with surgery. Impression: -The anatomy and physiology of the anal canal was discussed at length with the patient. The pathophysiology of hemorrhoids  was discussed at length again today -We have again re-discussed hemorrhoidectomy with anorectal exam under anesthesia -The planned procedure, material risks (including, but not limited to, pain, bleeding, infection and rare cases of perineal sepsis, scarring, anal stenosis, need for blood transfusion, damage to anal sphincter, incontinence of gas and/or stool, need for additional procedures, recurrence, pneumonia, heart attack, stroke, death) benefits and alternatives to surgery were discussed at length. I noted a good probability that the procedure would help improve her symptoms. The patient's questions were answered to her satisfaction, she voiced understanding and elected to proceed with surgery. Additionally, we discussed typical postoperative expectations and the recovery process. This patient encounter took 30 minutes today to perform the following: take history, perform exam, review outside records, counsel the patient on their diagnosis and document encounter, findings & plan in the EHR  Signed by Ileana Roup, MD (08/28/2019 11:03 AM)

## 2019-09-03 NOTE — Anesthesia Preprocedure Evaluation (Addendum)
Anesthesia Evaluation  Patient identified by MRN, date of birth, ID band Patient awake    Reviewed: Allergy & Precautions, NPO status , Patient's Chart, lab work & pertinent test results  Airway Mallampati: II  TM Distance: >3 FB Neck ROM: Full    Dental no notable dental hx.    Pulmonary  COVID positive 1/26/2, no retest   Pulmonary exam normal breath sounds clear to auscultation       Cardiovascular hypertension, Pt. on medications Normal cardiovascular exam Rhythm:Regular Rate:Normal  Hasn't taken BP meds in weeks   Neuro/Psych negative neurological ROS  negative psych ROS   GI/Hepatic Neg liver ROS, GERD  Medicated and Controlled,Internal hemorrhoids   Endo/Other  negative endocrine ROS  Renal/GU negative Renal ROS  negative genitourinary   Musculoskeletal negative musculoskeletal ROS (+)   Abdominal   Peds negative pediatric ROS (+)  Hematology negative hematology ROS (+)   Anesthesia Other Findings HLD  Reproductive/Obstetrics negative OB ROS                            Anesthesia Physical Anesthesia Plan  ASA: II  Anesthesia Plan: General   Post-op Pain Management:    Induction: Intravenous  PONV Risk Score and Plan: 4 or greater and Ondansetron, Dexamethasone, Scopolamine patch - Pre-op and Midazolam  Airway Management Planned: Oral ETT  Additional Equipment: None  Intra-op Plan:   Post-operative Plan: Extubation in OR  Informed Consent: I have reviewed the patients History and Physical, chart, labs and discussed the procedure including the risks, benefits and alternatives for the proposed anesthesia with the patient or authorized representative who has indicated his/her understanding and acceptance.     Dental advisory given  Plan Discussed with: CRNA  Anesthesia Plan Comments: (Prone positioning)       Anesthesia Quick Evaluation

## 2019-09-04 ENCOUNTER — Other Ambulatory Visit: Payer: Self-pay

## 2019-09-04 ENCOUNTER — Ambulatory Visit (HOSPITAL_BASED_OUTPATIENT_CLINIC_OR_DEPARTMENT_OTHER): Payer: 59 | Admitting: Anesthesiology

## 2019-09-04 ENCOUNTER — Ambulatory Visit (HOSPITAL_BASED_OUTPATIENT_CLINIC_OR_DEPARTMENT_OTHER)
Admission: RE | Admit: 2019-09-04 | Discharge: 2019-09-04 | Disposition: A | Discharge status: Home / Self Care | Payer: 59 | Attending: Surgery | Admitting: Surgery

## 2019-09-04 ENCOUNTER — Encounter (HOSPITAL_BASED_OUTPATIENT_CLINIC_OR_DEPARTMENT_OTHER)
Admission: RE | Disposition: A | Discharge status: Home / Self Care | Payer: Self-pay | Source: Home / Self Care | Attending: Surgery

## 2019-09-04 ENCOUNTER — Encounter (HOSPITAL_BASED_OUTPATIENT_CLINIC_OR_DEPARTMENT_OTHER): Payer: Self-pay | Admitting: Surgery

## 2019-09-04 DIAGNOSIS — I10 Essential (primary) hypertension: Secondary | ICD-10-CM | POA: Insufficient documentation

## 2019-09-04 DIAGNOSIS — K219 Gastro-esophageal reflux disease without esophagitis: Secondary | ICD-10-CM | POA: Diagnosis not present

## 2019-09-04 DIAGNOSIS — K648 Other hemorrhoids: Secondary | ICD-10-CM | POA: Diagnosis not present

## 2019-09-04 DIAGNOSIS — K649 Unspecified hemorrhoids: Secondary | ICD-10-CM | POA: Diagnosis not present

## 2019-09-04 DIAGNOSIS — Z79899 Other long term (current) drug therapy: Secondary | ICD-10-CM | POA: Diagnosis not present

## 2019-09-04 DIAGNOSIS — K641 Second degree hemorrhoids: Secondary | ICD-10-CM | POA: Insufficient documentation

## 2019-09-04 DIAGNOSIS — E782 Mixed hyperlipidemia: Secondary | ICD-10-CM | POA: Diagnosis not present

## 2019-09-04 HISTORY — PX: RECTAL EXAM UNDER ANESTHESIA: SHX6399

## 2019-09-04 HISTORY — PX: HEMORRHOID SURGERY: SHX153

## 2019-09-04 LAB — POCT I-STAT, CHEM 8
BUN: 11 mg/dL (ref 6–20)
Calcium, Ion: 1.24 mmol/L (ref 1.15–1.40)
Chloride: 104 mmol/L (ref 98–111)
Creatinine, Ser: 0.8 mg/dL (ref 0.44–1.00)
Glucose, Bld: 87 mg/dL (ref 70–99)
HCT: 30 % — ABNORMAL LOW (ref 36.0–46.0)
Hemoglobin: 10.2 g/dL — ABNORMAL LOW (ref 12.0–15.0)
Potassium: 4 mmol/L (ref 3.5–5.1)
Sodium: 142 mmol/L (ref 135–145)
TCO2: 27 mmol/L (ref 22–32)

## 2019-09-04 SURGERY — HEMORRHOIDECTOMY
Anesthesia: General | Site: Rectum

## 2019-09-04 MED ORDER — SCOPOLAMINE 1 MG/3DAYS TD PT72
MEDICATED_PATCH | TRANSDERMAL | Status: AC
  Filled 2019-09-04: qty 1

## 2019-09-04 MED ORDER — ACETAMINOPHEN 500 MG PO TABS
1000.0000 mg | ORAL_TABLET | Freq: Once | ORAL | Status: DC
  Filled 2019-09-04: qty 2

## 2019-09-04 MED ORDER — DEXAMETHASONE SODIUM PHOSPHATE 4 MG/ML IJ SOLN
INTRAMUSCULAR | Status: DC | PRN
  Administered 2019-09-04: 5 mg via INTRAVENOUS

## 2019-09-04 MED ORDER — SODIUM CHLORIDE (PF) 0.9 % IJ SOLN
INTRAMUSCULAR | Status: DC | PRN
  Administered 2019-09-04: 15 mL

## 2019-09-04 MED ORDER — ACETAMINOPHEN 500 MG PO TABS
1000.0000 mg | ORAL_TABLET | ORAL | Status: AC
  Administered 2019-09-04: 1000 mg via ORAL
  Filled 2019-09-04: qty 2

## 2019-09-04 MED ORDER — 0.9 % SODIUM CHLORIDE (POUR BTL) OPTIME
TOPICAL | Status: DC | PRN
  Administered 2019-09-04: 1000 mL

## 2019-09-04 MED ORDER — MIDAZOLAM HCL 5 MG/5ML IJ SOLN
INTRAMUSCULAR | Status: DC | PRN
  Administered 2019-09-04: 2 mg via INTRAVENOUS

## 2019-09-04 MED ORDER — LACTATED RINGERS IV SOLN
INTRAVENOUS | Status: DC
  Filled 2019-09-04: qty 1000

## 2019-09-04 MED ORDER — PROPOFOL 10 MG/ML IV BOLUS
INTRAVENOUS | Status: DC | PRN
  Administered 2019-09-04: 100 mg via INTRAVENOUS

## 2019-09-04 MED ORDER — FENTANYL CITRATE (PF) 100 MCG/2ML IJ SOLN
INTRAMUSCULAR | Status: AC
  Filled 2019-09-04: qty 2

## 2019-09-04 MED ORDER — ONDANSETRON HCL 4 MG/2ML IJ SOLN
INTRAMUSCULAR | Status: AC
  Filled 2019-09-04: qty 2

## 2019-09-04 MED ORDER — PROMETHAZINE HCL 25 MG/ML IJ SOLN
6.2500 mg | INTRAMUSCULAR | Status: DC | PRN
  Filled 2019-09-04: qty 1

## 2019-09-04 MED ORDER — OXYCODONE HCL 5 MG/5ML PO SOLN
5.0000 mg | Freq: Once | ORAL | Status: DC | PRN
  Filled 2019-09-04: qty 5

## 2019-09-04 MED ORDER — CHLORHEXIDINE GLUCONATE CLOTH 2 % EX PADS
6.0000 | MEDICATED_PAD | Freq: Once | CUTANEOUS | Status: DC
  Filled 2019-09-04: qty 6

## 2019-09-04 MED ORDER — ROCURONIUM BROMIDE 10 MG/ML (PF) SYRINGE
PREFILLED_SYRINGE | INTRAVENOUS | Status: AC
  Filled 2019-09-04: qty 10

## 2019-09-04 MED ORDER — ROCURONIUM BROMIDE 100 MG/10ML IV SOLN
INTRAVENOUS | Status: DC | PRN
  Administered 2019-09-04: 40 mg via INTRAVENOUS

## 2019-09-04 MED ORDER — LIDOCAINE HCL (CARDIAC) PF 100 MG/5ML IV SOSY
PREFILLED_SYRINGE | INTRAVENOUS | Status: DC | PRN
  Administered 2019-09-04: 50 mg via INTRAVENOUS

## 2019-09-04 MED ORDER — ONDANSETRON HCL 4 MG/2ML IJ SOLN
INTRAMUSCULAR | Status: DC | PRN
  Administered 2019-09-04: 4 mg via INTRAVENOUS

## 2019-09-04 MED ORDER — FENTANYL CITRATE (PF) 100 MCG/2ML IJ SOLN
INTRAMUSCULAR | Status: DC | PRN
  Administered 2019-09-04: 50 ug via INTRAVENOUS
  Administered 2019-09-04: 25 ug via INTRAVENOUS
  Administered 2019-09-04 (×2): 50 ug via INTRAVENOUS
  Administered 2019-09-04: 25 ug via INTRAVENOUS

## 2019-09-04 MED ORDER — DIBUCAINE (PERIANAL) 1 % EX OINT
TOPICAL_OINTMENT | CUTANEOUS | Status: DC | PRN
  Administered 2019-09-04: 1 via RECTAL

## 2019-09-04 MED ORDER — HYDROMORPHONE HCL 1 MG/ML IJ SOLN
0.2500 mg | INTRAMUSCULAR | Status: DC | PRN
  Filled 2019-09-04: qty 0.5

## 2019-09-04 MED ORDER — KETOROLAC TROMETHAMINE 30 MG/ML IJ SOLN
INTRAMUSCULAR | Status: DC | PRN
  Administered 2019-09-04: 30 mg via INTRAVENOUS

## 2019-09-04 MED ORDER — SCOPOLAMINE 1 MG/3DAYS TD PT72
1.0000 | MEDICATED_PATCH | TRANSDERMAL | Status: DC
  Administered 2019-09-04: 1.5 mg via TRANSDERMAL
  Filled 2019-09-04: qty 1

## 2019-09-04 MED ORDER — DEXAMETHASONE SODIUM PHOSPHATE 10 MG/ML IJ SOLN
INTRAMUSCULAR | Status: AC
  Filled 2019-09-04: qty 1

## 2019-09-04 MED ORDER — SUGAMMADEX SODIUM 200 MG/2ML IV SOLN
INTRAVENOUS | Status: DC | PRN
  Administered 2019-09-04: 200 mg via INTRAVENOUS

## 2019-09-04 MED ORDER — TRAMADOL HCL 50 MG PO TABS: 50.0000 mg | ORAL_TABLET | Freq: Four times a day (QID) | ORAL | 0 refills | Status: AC | PRN

## 2019-09-04 MED ORDER — BUPIVACAINE LIPOSOME 1.3 % IJ SUSP
20.0000 mL | Freq: Once | INTRAMUSCULAR | Status: DC
  Filled 2019-09-04: qty 20

## 2019-09-04 MED ORDER — BUPIVACAINE-EPINEPHRINE 0.5% -1:200000 IJ SOLN
INTRAMUSCULAR | Status: DC | PRN
  Administered 2019-09-04: 15 mL

## 2019-09-04 MED ORDER — OXYCODONE HCL 5 MG PO TABS
5.0000 mg | ORAL_TABLET | Freq: Once | ORAL | Status: DC | PRN
  Filled 2019-09-04: qty 1

## 2019-09-04 MED ORDER — KETOROLAC TROMETHAMINE 30 MG/ML IJ SOLN
30.0000 mg | Freq: Once | INTRAMUSCULAR | Status: DC | PRN
  Filled 2019-09-04: qty 1

## 2019-09-04 MED ORDER — BUPIVACAINE LIPOSOME 1.3 % IJ SUSP
INTRAMUSCULAR | Status: DC | PRN
  Administered 2019-09-04: 20 mL

## 2019-09-04 MED ORDER — PROPOFOL 10 MG/ML IV BOLUS
INTRAVENOUS | Status: AC
  Filled 2019-09-04: qty 20

## 2019-09-04 MED ORDER — MEPERIDINE HCL 25 MG/ML IJ SOLN
6.2500 mg | INTRAMUSCULAR | Status: DC | PRN
  Filled 2019-09-04: qty 1

## 2019-09-04 MED ORDER — ACETAMINOPHEN 500 MG PO TABS
ORAL_TABLET | ORAL | Status: AC
  Filled 2019-09-04: qty 2

## 2019-09-04 MED ORDER — MIDAZOLAM HCL 2 MG/2ML IJ SOLN
INTRAMUSCULAR | Status: AC
  Filled 2019-09-04: qty 2

## 2019-09-04 MED ORDER — KETOROLAC TROMETHAMINE 30 MG/ML IJ SOLN
INTRAMUSCULAR | Status: AC
  Filled 2019-09-04: qty 1

## 2019-09-04 MED ORDER — LIDOCAINE 2% (20 MG/ML) 5 ML SYRINGE
INTRAMUSCULAR | Status: AC
  Filled 2019-09-04: qty 5

## 2019-09-04 MED FILL — traMADol HCL 50 MG TABS: 50 | 5 days supply | Qty: 20 | Fill #0

## 2019-09-04 SURGICAL SUPPLY — 63 items
APL SKNCLS STERI-STRIP NONHPOA (GAUZE/BANDAGES/DRESSINGS) ×2
BENZOIN TINCTURE PRP APPL 2/3 (GAUZE/BANDAGES/DRESSINGS) ×4 IMPLANT
BLADE EXTENDED COATED 6.5IN (ELECTRODE) IMPLANT
BLADE HEX COATED 2.75 (ELECTRODE) ×2 IMPLANT
BLADE SURG 10 STRL SS (BLADE) IMPLANT
BLADE SURG 15 STRL LF DISP TIS (BLADE) ×1 IMPLANT
BLADE SURG 15 STRL SS (BLADE) ×2
BRIEF STRETCH FOR OB PAD LRG (UNDERPADS AND DIAPERS) ×2 IMPLANT
CANISTER SUCT 3000ML PPV (MISCELLANEOUS) ×2 IMPLANT
COVER BACK TABLE 60X90IN (DRAPES) ×2 IMPLANT
COVER MAYO STAND STRL (DRAPES) ×2 IMPLANT
COVER WAND RF STERILE (DRAPES) ×2 IMPLANT
DECANTER SPIKE VIAL GLASS SM (MISCELLANEOUS) ×2 IMPLANT
DRAPE HYSTEROSCOPY (DRAPE) IMPLANT
DRAPE LAPAROTOMY 100X72 PEDS (DRAPES) ×2 IMPLANT
DRAPE SHEET LG 3/4 BI-LAMINATE (DRAPES) IMPLANT
DRAPE UTILITY XL STRL (DRAPES) ×2 IMPLANT
DRSG PAD ABDOMINAL 8X10 ST (GAUZE/BANDAGES/DRESSINGS) ×2 IMPLANT
GAUZE SPONGE 4X4 12PLY STRL (GAUZE/BANDAGES/DRESSINGS) ×2 IMPLANT
GLOVE BIO SURGEON STRL SZ7.5 (GLOVE) ×2 IMPLANT
GLOVE INDICATOR 8.0 STRL GRN (GLOVE) ×2 IMPLANT
GOWN STRL REUS W/ TWL XL LVL3 (GOWN DISPOSABLE) ×1 IMPLANT
GOWN STRL REUS W/TWL XL LVL3 (GOWN DISPOSABLE) ×2
HYDROGEN PEROXIDE 16OZ (MISCELLANEOUS) IMPLANT
IV CATH 14GX2 1/4 (CATHETERS) IMPLANT
IV CATH 18G SAFETY (IV SOLUTION) IMPLANT
KIT SIGMOIDOSCOPE (SET/KITS/TRAYS/PACK) IMPLANT
KIT TURNOVER CYSTO (KITS) ×2 IMPLANT
LEGGING LITHOTOMY PAIR STRL (DRAPES) IMPLANT
LOOP VESSEL MAXI BLUE (MISCELLANEOUS) IMPLANT
NDL SAFETY ECLIPSE 18X1.5 (NEEDLE) IMPLANT
NEEDLE HYPO 18GX1.5 SHARP (NEEDLE)
NEEDLE HYPO 22GX1.5 SAFETY (NEEDLE) ×2 IMPLANT
NS IRRIG 500ML POUR BTL (IV SOLUTION) ×2 IMPLANT
PACK BASIN DAY SURGERY FS (CUSTOM PROCEDURE TRAY) ×2 IMPLANT
PAD ABD 8X10 STRL (GAUZE/BANDAGES/DRESSINGS) ×1 IMPLANT
PENCIL BUTTON HOLSTER BLD 10FT (ELECTRODE) ×2 IMPLANT
SPONGE HEMORRHOID 8X3CM (HEMOSTASIS) IMPLANT
SPONGE SURGIFOAM ABS GEL 12-7 (HEMOSTASIS) IMPLANT
SUCTION FRAZIER HANDLE 10FR (MISCELLANEOUS)
SUCTION TUBE FRAZIER 10FR DISP (MISCELLANEOUS) IMPLANT
SUT CHROMIC 2 0 SH (SUTURE) IMPLANT
SUT CHROMIC 3 0 SH 27 (SUTURE) IMPLANT
SUT MNCRL AB 4-0 PS2 18 (SUTURE) IMPLANT
SUT SILK 0 PSL NDL (SUTURE) IMPLANT
SUT SILK 0 TIES 10X30 (SUTURE) IMPLANT
SUT SILK 2 0 (SUTURE)
SUT SILK 2-0 18XBRD TIE 12 (SUTURE) IMPLANT
SUT VIC AB 2-0 SH 27 (SUTURE) ×2
SUT VIC AB 2-0 SH 27XBRD (SUTURE) IMPLANT
SUT VIC AB 3-0 SH 18 (SUTURE) IMPLANT
SUT VIC AB 3-0 SH 27 (SUTURE)
SUT VIC AB 3-0 SH 27X BRD (SUTURE) IMPLANT
SUT VIC AB 3-0 SH 27XBRD (SUTURE) IMPLANT
SUT VIC AB 4-0 P-3 18XBRD (SUTURE) IMPLANT
SUT VIC AB 4-0 P3 18 (SUTURE)
SYR 20ML LL LF (SYRINGE) IMPLANT
SYR BULB IRRIGATION 50ML (SYRINGE) ×2 IMPLANT
SYR CONTROL 10ML LL (SYRINGE) ×2 IMPLANT
TOWEL OR 17X26 10 PK STRL BLUE (TOWEL DISPOSABLE) ×2 IMPLANT
TRAY DSU PREP LF (CUSTOM PROCEDURE TRAY) ×2 IMPLANT
TUBE CONNECTING 12X1/4 (SUCTIONS) ×2 IMPLANT
YANKAUER SUCT BULB TIP NO VENT (SUCTIONS) ×2 IMPLANT

## 2019-09-04 NOTE — Anesthesia Procedure Notes (Addendum)
Procedure Name: Intubation Date/Time: 09/04/2019 10:49 AM Performed by: Mechele Claude, CRNA Pre-anesthesia Checklist: Patient identified, Emergency Drugs available, Suction available and Patient being monitored Patient Re-evaluated:Patient Re-evaluated prior to induction Oxygen Delivery Method: Circle system utilized Preoxygenation: Pre-oxygenation with 100% oxygen Induction Type: IV induction Ventilation: Mask ventilation without difficulty Laryngoscope Size: Mac and 3 Grade View: Grade II Tube type: Oral Tube size: 7.0 mm Number of attempts: 1 Airway Equipment and Method: Stylet Placement Confirmation: ETT inserted through vocal cords under direct vision,  positive ETCO2 and breath sounds checked- equal and bilateral Secured at: 19 cm Tube secured with: Tape Dental Injury: Teeth and Oropharynx as per pre-operative assessment  Comments: Anterior larynx.  Required 90 degrees angle on ETT to place it correctly.

## 2019-09-04 NOTE — Op Note (Signed)
09/04/2019  12:16 PM  PATIENT:  Lori Sullivan  49 y.o. female  Patient Care Team: Dollene Cleveland, DO as PCP - General (Family Medicine)  PRE-OPERATIVE DIAGNOSIS:  Symptomatic, bleeding hemorrhoids  POST-OPERATIVE DIAGNOSIS:  Same  PROCEDURE:   1. Hemorrhoidectomy, 3 column 2. Anorectal exam under anesthesia  SURGEON:  Surgeon(s): Andria Meuse, MD   ANESTHESIA:   local and general  SPECIMEN:   1. Right posterior hemorrhoid bundle 2. Right anterior hemorrhoid bundle 3. Left lateral hemorrhoid bundle  DISPOSITION OF SPECIMEN:  PATHOLOGY  COUNTS:  Sponge, needle, and instrument counts were reported correct x2 at conclusion.  EBL: 20 mL  Drains: None  PLAN OF CARE: Discharge to home after PACU  PATIENT DISPOSITION:  PACU - hemodynamically stable.  INDICATION: Ms. Lori Sullivan is a very pleasant 49 year old female with no known medical history whom presented to our office as a referral from Dr. Elnoria Howard.  He had seen her for hematochezia and been managing her there.  She had undergone endoscopy with him which was remarkable for internal hemorrhoids with ulceration and visible vessels.  Over the course of a week her hemoglobin had fallen from 11-8.  She had ongoing bleeding.  She was urgently referred for consideration of surgery.  There were no other findings to explain her bleeding on her colonoscopy.  She had no pain related to this.  She was seen with the assistance of a translator and her husband in the office.  Given the degree of bleeding she was still experiencing, we discussed proceeding with surgery.  She was scheduled and on the day of her surgery opted not to pursue surgery.  We discussed that there was a high risk for ongoing issues.  This was all done with a [facility] medical interpreter.  She acknowledged these risks.  She returned to the office for follow-up in short interval-1 week later.  At that time she was still having ongoing bleeding that appeared to be fairly  significant.  She has now requesting surgery.  Again with the aid of a medical interpreter, we reviewed the procedure at length, material risks, benefits and alternatives as noted elsewhere in the health record.  She elected to proceed.  On the day of surgery, we again discussed all of these at length with another in person interpreter.  OR FINDINGS: Ulcerated bleeding internal hemorrhoids-right anterior, right posterior, left lateral.  These were all very friable.  Given these findings and the difficulties encountered with follow-up and scheduling, we opted to pursue a 3 column hemorrhoidectomy.  After completion of 2 columns, an anorectal exam demonstrated a widely patent anus without any narrowing, accommodating 3 finger without difficulty so the 3rd column was then removed.  A piece of Surgifoam coated in dibucaine was placed in the anal canal at conclusion.  DESCRIPTION: The patient was identified in the preoperative holding area and taken to the OR.  SCDs were placed and she then underwent general endotracheal anesthesia.  She was then rolled into the prone jackknife position on the operating room table.  Pressure points were then evaluated and padded.  Benzoin ointment was applied to the buttocks and these were gently taped apart.  A surgical timeout was performed indicating the correct patient, procedure, positioning and operative plan.  A perianal block was performed using a dilute mixture of Exparel with Marcaine/epinephrine.    A well lubricated digital rectal exam was performed which demonstrated no palpable abnormalities in the distal rectum.  A Hill-Ferguson anoscope was into  the anal canal and circumferential inspection demonstrated 3 columns of friable, ulcerated, oozing internal hemorrhoids.  Largest being right anterior but still quite significant were the right posterior and left lateral bundles.  Temporary hemostasis was achieved with electrocautery.  Attention was turned to the most  significant bundle which was right anterior.  Using a Pratt bivalve anoscope, the internal hemorrhoidal tissue was bunched up.  This was then grasped with a DeBakey forcep.  The margins of excision were marked with electrocautery.  This was then incised sharply and the hemorrhoidectomy carried out using electrocautery.  The external and internal sphincter muscles were identified and swept away from the field of dissection protecting these from injury.  The hemorrhoidal pedicle was then controlled with a 2-0 Vicryl figure-of-eight suture and the hemorrhoid divided and passed off the specimen.  All hemorrhoidal tissue was cleared from the underlying sphincter muscle at this location.  The mucosa was then approximated with a running locking 3-0 Vicryl suture.  The hemorrhoidectomy site was then irrigated and hemostasis verified.  Attention was then turned to the right posterior hemorrhoidal bundle. Using a Pratt bivalve anoscope, the internal hemorrhoidal tissue was bunched up.  This was then grasped with a DeBakey forcep.  The margins of excision were marked with electrocautery.  This was then incised sharply and the hemorrhoidectomy carried out using electrocautery.  The external and internal sphincter muscles were identified and swept away from the field of dissection protecting these from injury.  The hemorrhoidal pedicle was then controlled with a 2-0 Vicryl figure-of-eight suture and the hemorrhoid divided and passed off the specimen.  All hemorrhoidal tissue was cleared from the underlying sphincter muscle at this location.  The mucosa was then approximated with a running locking 3-0 Vicryl suture.  The hemorrhoidectomy site was then irrigated and hemostasis verified.  The anal canal was then carefully inspected and still quite capacious, accommodating 3 fingers without difficulty.  The left lateral hemorrhoidal bundle was still friable and oozing.  Given these findings, the decision was made to pursue a 3  column hemorrhoidectomy.  Attention was turned to the left lateral hemorrhoidal bundle. Using a Pratt bivalve anoscope, the internal hemorrhoidal tissue was bunched up.  This was then grasped with a DeBakey forcep.  The margins of excision were marked with electrocautery.  This was then incised sharply and the hemorrhoidectomy carried out using electrocautery.  The external and internal sphincter muscles were identified and swept away from the field of dissection protecting these from injury.  The hemorrhoidal pedicle was then controlled with a 2-0 Vicryl figure-of-eight suture and the hemorrhoid divided and passed off the specimen.  All hemorrhoidal tissue was cleared from the underlying sphincter muscle at this location.  The mucosa was then approximated with a running locking 3-0 Vicryl suture.  The hemorrhoidectomy site was then irrigated and hemostasis verified.  Additional local anesthetic was infiltrated into the perianal tissues.  Sponge, needle, and instrument counts were reported correct x2.  Final examination of the anal canal demonstrated hemostasis.  A piece of Surgifoam coated and dibucaine ointment was then placed in the anal canal.  A dressing consisting of 4 x 4's, ABD, and mesh underwear was placed.  The buttocks tape was released.  She was then rolled back onto the stretcher, awakened from anesthesia, extubated, and transferred to recovery in satisfactory condition.  DISPOSITION: PACU in satisfactory condition.

## 2019-09-04 NOTE — Anesthesia Postprocedure Evaluation (Signed)
Anesthesia Post Note  Patient: Lori Sullivan  Procedure(s) Performed: HEMORRHOIDECTOMY (N/A Rectum) ANORECTAL EXAM UNDER ANESTHESIA (N/A Rectum)     Patient location during evaluation: PACU Anesthesia Type: General Level of consciousness: awake and alert, oriented and patient cooperative Pain management: pain level controlled Vital Signs Assessment: post-procedure vital signs reviewed and stable Respiratory status: spontaneous breathing, nonlabored ventilation and respiratory function stable Cardiovascular status: blood pressure returned to baseline and stable Postop Assessment: no apparent nausea or vomiting Anesthetic complications: no    Last Vitals:  Vitals:   09/04/19 1215 09/04/19 1218  BP: (!) 109/57 (!) 109/57  Pulse:  80  Resp:  16  Temp:  (!) 36.4 C  SpO2:  100%    Last Pain:  Vitals:   09/04/19 0933  TempSrc: Oral  PainSc: 2                  Lannie Fields

## 2019-09-04 NOTE — Interval H&P Note (Signed)
History and Physical Interval Note:  09/04/2019 10:28 AM  Lori Sullivan  has presented today for surgery, with the diagnosis of BLEEDING HEMORRHOIDS.  The various methods of treatment have been discussed with the patient and family using an in-person interpreter. She is now requesting to proceed with surgery as she has had ongoing oozing from her hemorrhoids despite soft stool. After consideration of risks, benefits and other options for treatment, the patient has consented to  Procedure(s): HEMORRHOIDECTOMY (N/A) ANORECTAL EXAM UNDER ANESTHESIA (N/A) as a surgical intervention.  The patient's history has been reviewed, patient examined, no change in status, stable for surgery.  I have reviewed the patient's chart and labs.  Questions were answered to the patient's satisfaction.   Stephanie Coup Deborha Moseley

## 2019-09-04 NOTE — Transfer of Care (Signed)
Immediate Anesthesia Transfer of Care Note  Patient: Lori Sullivan  Procedure(s) Performed: Procedure(s) (LRB): HEMORRHOIDECTOMY (N/A) ANORECTAL EXAM UNDER ANESTHESIA (N/A)  Patient Location: PACU  Anesthesia Type: General  Level of Consciousness: awake, sedated, patient cooperative and responds to stimulation  Airway & Oxygen Therapy: Patient Spontanous Breathing and Patient connected to [place] 02 soft FM   Post-op Assessment: Report given to PACU RN, Post -op Vital signs reviewed and stable and Patient moving all extremities  Post vital signs: Reviewed and stable  Complications: No apparent anesthesia complications

## 2019-09-04 NOTE — Discharge Instructions (Addendum)
ANORECTAL SURGERY: POST OP INSTRUCTIONS  1. DIET: Follow a light bland diet the first 24 hours after arrival home, such as soup, liquids, crackers, etc.  Be sure to include lots of fluids daily.  Avoid fast food or heavy meals as your are more likely to get nauseated.  Eat a low fat diet the next few days after surgery.    2. Take your usually prescribed home medications unless otherwise directed.  3. PAIN CONTROL: a. It is helpful to take an over-the-counter pain medication regularly for the first few days/weeks.  Choose from the following that works best for you: i. Ibuprofen (Advil, etc) Three 200mg  tabs every 6 hours as needed. ii. Acetaminophen (Tylenol, etc) 500-650mg  every 6 hours as needed iii. NOTE: You may take both of these medications together - most patients find it most helpful when alternating between the two (i.e. Ibuprofen at 6am, tylenol at 9am, ibuprofen at 12pm ...) b. A  prescription for pain medication may have been prescribed for you at discharge.  Take your pain medication as prescribed.  i. If you are having problems/concerns with the prescription medicine, please call for further advice.  4. Avoid getting constipated.  Between the surgery and the pain medications, it is common to experience some constipation.  Increasing fluid intake (64oz of water per day) and taking a fiber supplement (such as Metamucil, Citrucel, FiberCon) 1-2 times a day regularly will usually help prevent this problem from occurring.  Take Miralax (over the counter) 1-2x/day while taking a narcotic pain medication. If no bowel movement after 48hours, you may additionally take a laxative like a bottle of Milk of Magnesia which can be purchased over the counter. Avoid enemas if possible as these are often painful.   5. Watch out for diarrhea.  If you have many loose bowel movements, simplify your diet to bland foods.  Stop any stool softeners and decrease your fiber supplement. If this worsens or  does not improve, please call us.  6. Wash / shower every day.  If you were discharged with a dressing, you may remove this the day after your surgery. You may shower normally, getting soap/water on your wound, particularly after bowel movements.  7. A piece of foam (Surgifoam) was intentionally placed in the anus at completion of the procedure. If you pass a bloody piece of gel with your first bowel movement, this is what this is and is not something to be concerned about. This is safe to flush in the commode. It is also normal for the first month or two after this surgery to feel tissue swelling, suture material (string) or skin tags. Over time this will all soften and go away. All suture material used will eventually dissolve on its own and will not need to be removed.  8. Soaking in a warm bath filled a couple inches ("Sitz bath") is a great way to clean the area after a bowel movement and many patients find it is a way to soothe the area.  9. ACTIVITIES as tolerated:   a. You may resume regular (light) daily activities beginning the next day--such as daily self-care, walking, climbing stairs--gradually increasing activities as tolerated.  If you can walk 30 minutes without difficulty, it is safe to try more intense activity such as jogging, treadmill, bicycling, low-impact aerobics, etc. b. Refrain from any heavy lifting or straining for the first 2 weeks after your procedure, particularly if your surgery was for hemorrhoids. c. Avoid activities that make your pain  worse d. You may drive when you are no longer taking prescription pain medication, you can comfortably wear a seatbelt, and you can safely maneuver your car and apply brakes.  10. FOLLOW UP in our office a. Please call [facility] at [phone] to set up an appointment to see your surgeon in the office for a follow-up appointment approximately 2 weeks after your surgery. b. Make sure that you call for this appointment the day you arrive  home to insure a convenient appointment time.  9. If you have disability or family leave forms that need to be completed, you may have them completed by your primary care physician's office; for return to work instructions, please ask our office staff and they will be happy to assist you in obtaining this documentation   When to call us [phone]: 1. Poor pain control 2. Reactions / problems with new medications (rash/itching, etc)  3. Fever over 101.5 F (38.5 C) 4. Inability to urinate 5. Nausea/vomiting 6. Worsening swelling or bruising 7. Continued bleeding from incision. 8. Increased pain, redness, or drainage from the incision  The clinic staff is available to answer your questions during regular business hours (8:30am-5pm).  Please don't hesitate to call and ask to speak to one of our nurses for clinical concerns.   A surgeon from [facility] is always on call at the hospitals   If you have a medical emergency, go to the nearest emergency room or call 911.   [facility], [place], [place], [place], [place]  [address] ? MAIN: [phone] FAX [phone] [email or web address]    Post Anesthesia Home Care Instructions  Activity: Get plenty of rest for the remainder of the day. A responsible adult should stay with you for 24 hours following the procedure.  For the next 24 hours, DO NOT: -Drive a car -Paediatric nurse -Drink alcoholic beverages -Take any medication unless instructed by your physician -Make any legal decisions or sign important papers.  Meals: Start with liquid foods such as gelatin or soup. Progress to regular foods as tolerated. Avoid greasy, spicy, heavy foods. If nausea and/or vomiting occur, drink only clear liquids until the nausea and/or vomiting subsides. Call your physician if vomiting continues.  Special Instructions/Symptoms: Your throat may feel dry or sore from the anesthesia or the  breathing tube placed in your throat during surgery. If this causes discomfort, gargle with warm salt water. The discomfort should disappear within 24 hours.  If you had a scopolamine patch placed behind your ear for the management of post- operative nausea and/or vomiting:  1. The medication in the patch is effective for 72 hours, after which it should be removed.  Wrap patch in a tissue and discard in the trash. Wash hands thoroughly with soap and water. 2. You may remove the patch earlier than 72 hours if you experience unpleasant side effects which may include dry mouth, dizziness or visual disturbances. 3. Avoid touching the patch. Wash your hands with soap and water after contact with the patch.  Remove patch Saturday September 07, 2019.  Information for Discharge Teaching: EXPAREL (bupivacaine liposome injectable suspension)   Your surgeon or anesthesiologist gave you EXPAREL(bupivacaine) to help control your pain after surgery.   EXPAREL is a local anesthetic that provides pain relief by numbing the tissue around the surgical site.  EXPAREL is designed to release pain medication over time and can control pain for up to 72 hours.  Depending on how you  respond to EXPAREL, you may require less pain medication during your recovery.  Possible side effects:  Temporary loss of sensation or ability to move in the area where bupivacaine was injected.  Nausea, vomiting, constipation  Rarely, numbness and tingling in your mouth or lips, lightheadedness, or anxiety may occur.  Call your doctor right away if you think you may be experiencing any of these sensations, or if you have other questions regarding possible side effects.  Follow all other discharge instructions given to you by your surgeon or nurse. Eat a healthy diet and drink plenty of water or other fluids.  If you return to the hospital for any reason within 96 hours following the administration of EXPAREL, it is important for  health care providers to know that you have received this anesthetic. A teal colored band has been placed on your arm with the date, time and amount of EXPAREL you have received in order to alert and inform your health care providers. Please leave this armband in place for the full 96 hours following administration, and then you may remove the band.  Remove green armband when numbness wears off in 3-4 days.

## 2019-09-05 LAB — SURGICAL PATHOLOGY

## 2019-09-20 MED FILL — LIDOCAINE 5 % CRM: 5 | 10 days supply | Qty: 30 | Fill #0

## 2019-09-25 NOTE — Progress Notes (Signed)
    SUBJECTIVE:   CHIEF COMPLAINT / HPI:   An in person interpreter was used to translate this entire visit.  Hip pain: Patient reports worsening left leg and buttock pain that started about 2 months ago, pain has acutely gotten worse since her recent hemorrhoidectomy.  The pain is sore with elements of pounding/throbbing pain.  She denies the pain radiating into her left foot.  She denies any falls, accidents.  Denies edema or swelling.  Denies chest pain shortness of breath.  Health maintenance: Patient is due for HIV screening, flu shot, and Pap smear.  Unable to get these today.  Anemia: Most likely secondary to blood loss anemia, last CBC February 2021 showed hemoglobin at 9, most likely due to bright red blood per rectum at the time.  Patient denying fatigue, shortness of breath, chest pain.  PERTINENT  PMH / PSH:  Patient Active Problem List   Diagnosis Date Noted  . Pain of left lower extremity 10/17/2019  . Sciatica of left side 03/28/2019  . Mixed hyperlipidemia 07/12/2018  . Abdominal pain 03/06/2018  . Bilateral leg pain 08/23/2016  . Thyromegaly 01/04/2015  . Lumbar pain 12/05/2014  . Essential hypertension 12/05/2014  . Internal and external bleeding hemorrhoids 02/20/2013  . GERD 05/28/2010  . Constipation 05/28/2010  . RECTAL BLEEDING 05/28/2010    OBJECTIVE:   BP 122/78   Pulse 85   Wt 134 lb 3.2 oz (60.9 kg)   SpO2 98%   BMI 26.21 kg/m    Physical exam: General: Well appearing female, nontoxic appearing, no apparent distress Cardiac: RRR, S1-S2 present, no murmurs appreciated Respiratory: CTA bilaterally, normal work of breathing, speaking complete sentences Extremities: Left lower extremity without deformity, cyanosis, injury; mild tenderness to palpation appreciated in patient's left buttock, no tenderness to palpation over left greater trochanter; strength 5/5 in bilateral lower extremities, full painless range of motion, negative  FADIR/FABER.  ASSESSMENT/PLAN:   Pain of left lower extremity Patient with left lower extremity pain without any evident sign of weakness or dysfunction.  Benign physical exam.  Consider piriformis syndrome, IT band dysfunction, or quadriceps deconditioning as possible causes.  No indication for arthritis or sciatic nerve pain originating from low back as cause of patient's pain. -Patient given home exercises to strengthen her lower extremities and improve range of motion. -Can have Tylenol as needed for pain -Can use topical heat/cooling to help ease pain -Follow-up as needed if pain does not resolve or if it worsens     Dollene Cleveland, DO [facility]

## 2019-09-26 ENCOUNTER — Other Ambulatory Visit: Payer: Self-pay

## 2019-09-26 ENCOUNTER — Encounter: Payer: Self-pay | Admitting: Family Medicine

## 2019-09-26 ENCOUNTER — Ambulatory Visit: Payer: 59 | Admitting: Family Medicine

## 2019-09-26 VITALS — BP 122/78 | HR 85 | Wt 134.2 lb

## 2019-09-26 DIAGNOSIS — R1013 Epigastric pain: Secondary | ICD-10-CM

## 2019-09-26 DIAGNOSIS — M79605 Pain in left leg: Secondary | ICD-10-CM | POA: Diagnosis not present

## 2019-09-26 MED ORDER — LIDOCAINE VISCOUS HCL 2 % MT SOLN: 15.0000 mL | OROMUCOSAL | 2 refills | Status: DC | PRN

## 2019-09-26 MED FILL — LIDOCAINE 2% VISCOUS SOLN: 2 | 7 days supply | Qty: 100 | Fill #0

## 2019-09-26 NOTE — Patient Instructions (Addendum)
[facility] Address: [address] [place], [place] [address] Phone: [phone]  Quadriceps Contusion Rehab Ask your health care provider which exercises are safe for you. Do exercises exactly as told by your health care provider and adjust them as directed. It is normal to feel mild stretching, pulling, tightness, or discomfort as you do these exercises. Stop right away if you feel sudden pain or your pain gets worse.  Right after you are injured (acute phase), it is often more important to rest your quadriceps. Starting exercises too soon can make your healing take longer. Do not begin these exercises until told by your health care provider. Stretching and range-of-motion exercises  These exercises warm up your muscles and joints and improve the movement and flexibility of your thighs. These exercises can also help to relieve pain, numbness, and tingling. Knee flexion, active This is an exercise in which you use your effort to move your knee joint (active flexion). 1. Lie on your back with both legs straight. If this causes back discomfort, bend your healthy knee so your foot is flat on the floor. 2. Slowly slide your left / right heel back toward your buttocks (flexion). Stop when you feel a gentle stretch in the front of your knee or thigh (quadriceps). 3. Hold this position for __________ seconds. 4. Slowly slide your left / right heel back to the starting position. Repeat __________ times. Complete this exercise __________ times a day. Hamstring stretch, doorway 1. Lie on your back in front of a doorway with your left / right leg resting on the wall and your other leg flat on the floor in the doorway. There should be a slight bend in your left / right knee. 2. Straighten your left / right knee. You should feel a stretch behind your knee or thigh (hamstring). If you do not, scoot your buttocks closer to the door. 3. Hold this position for __________ seconds. Repeat __________ times.  Complete this exercise __________ times a day. Strengthening exercises These exercises build strength and endurance in your thighs. Endurance is the ability to use your muscles for a long time, even after your muscles get tired. Quadriceps, isometric This is an exercise in which you stretch the muscles in front of your thigh (quadriceps) without moving your knee joint (isometric). 1. Lie on your back with your left / right leg extended and your other knee bent. 2. Gradually tense the muscles in the front of your left / right thigh. You should see your kneecap slide up toward your hip or see increased dimpling just above the knee. This motion will push the back of your knee down toward the floor. 3. For __________ seconds, hold the muscle as tight as you can without increasing your pain. 4. Relax the muscles slowly and completely after each repetition. Repeat __________ times. Complete this exercise __________ times a day. Wall slides  Follow the instructions for form closely. Knee pain can occur if your feet or knees are not placed properly. Your health care provider will alter the knee angle that you use during this exercise based on your symptoms and progress. You may start with just a small motion. 1. Lean your back against a smooth wall or door, and walk your feet out 18-24 inches (46-61 cm) from it. 2. Place your feet hip-width apart. 3. Slowly slide down the wall or door until your knees bend __________ degrees. Keep your knees over your heels, not over your toes. Do not let your  thighs angle inward. 4. Hold this position for __________ seconds. 5. Stand up to rest for __________ seconds after each repetition. Repeat __________ times. Complete this exercise __________ times a day. Balance exercise This exercise improves or maintains your balance. Balance is important in preventing falls. Single leg stand 1. Stand near a railing or in a doorway so you can use the rail or the door frame  for balance if needed. 2. Stand on your left / right foot. Keep your big toe down on the floor and try to keep your arch lifted. 3. Hold this position for __________ seconds. 4. If this exercise is too easy, try doing it with your eyes closed or while standing on a pillow. Repeat __________ times. Complete this exercise __________ times a day. This information is not intended to replace advice given to you by your health care provider. Make sure you discuss any questions you have with your health care provider. Document Revised: 10/26/2018 Document Reviewed: 04/26/2018 Elsevier Patient Education  [place].

## 2019-10-02 ENCOUNTER — Other Ambulatory Visit: Payer: Self-pay | Admitting: Family Medicine

## 2019-10-02 DIAGNOSIS — M5432 Sciatica, left side: Secondary | ICD-10-CM

## 2019-10-02 MED FILL — LIDOCAINE 5 % CRM: 5 | 10 days supply | Qty: 30 | Fill #1

## 2019-10-03 MED FILL — GABAPENTIN 100 MG CAPSULE: 100 | 15 days supply | Qty: 90 | Fill #0

## 2019-10-17 DIAGNOSIS — M79605 Pain in left leg: Secondary | ICD-10-CM | POA: Insufficient documentation

## 2019-10-17 NOTE — Assessment & Plan Note (Signed)
Patient with left lower extremity pain without any evident sign of weakness or dysfunction.  Benign physical exam.  Consider piriformis syndrome, IT band dysfunction, or quadriceps deconditioning as possible causes.  No indication for arthritis or sciatic nerve pain originating from low back as cause of patient's pain. -Patient given home exercises to strengthen her lower extremities and improve range of motion. -Can have Tylenol as needed for pain -Can use topical heat/cooling to help ease pain -Follow-up as needed if pain does not resolve or if it worsens

## 2019-10-22 ENCOUNTER — Ambulatory Visit (INDEPENDENT_AMBULATORY_CARE_PROVIDER_SITE_OTHER): Payer: 59 | Admitting: Family Medicine

## 2019-10-22 ENCOUNTER — Other Ambulatory Visit: Payer: Self-pay

## 2019-10-22 VITALS — BP 114/76 | HR 91 | Ht 60.0 in | Wt 138.4 lb

## 2019-10-22 DIAGNOSIS — E782 Mixed hyperlipidemia: Secondary | ICD-10-CM | POA: Diagnosis not present

## 2019-10-22 DIAGNOSIS — Z114 Encounter for screening for human immunodeficiency virus [HIV]: Secondary | ICD-10-CM

## 2019-10-22 DIAGNOSIS — K644 Residual hemorrhoidal skin tags: Secondary | ICD-10-CM | POA: Diagnosis not present

## 2019-10-22 DIAGNOSIS — K648 Other hemorrhoids: Secondary | ICD-10-CM

## 2019-10-22 DIAGNOSIS — Z789 Other specified health status: Secondary | ICD-10-CM

## 2019-10-22 DIAGNOSIS — D649 Anemia, unspecified: Secondary | ICD-10-CM | POA: Diagnosis not present

## 2019-10-22 DIAGNOSIS — M5432 Sciatica, left side: Secondary | ICD-10-CM | POA: Diagnosis not present

## 2019-10-22 NOTE — Assessment & Plan Note (Signed)
Repeat CBC today given above symptoms. May need to consider iron therapy with stool softener. Would avoid NSAIDs given rectal bleeding.

## 2019-10-22 NOTE — Patient Instructions (Signed)
It was wonderful to see you today.  Please bring ALL of your medications with you to every visit.   Thank you for choosing [facility].   Please call [phone] with any questions about today's appointment.  Please be sure to schedule follow up at the front  desk before you leave today.   Terisa Starr, MD  Family Medicine   Follow up in 4 weeks with Dr. Dareen Piano   I will call you about the blood work  I am going to reach out [employer] [facility] [employer] to see if we can work on some English classes

## 2019-10-22 NOTE — Assessment & Plan Note (Signed)
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 Exercises given today. Consider additional imaging +/- Orthopedics referral in future.

## 2019-10-22 NOTE — Assessment & Plan Note (Signed)
Repeat lipids today given prior elevation.
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
## 2019-10-23 ENCOUNTER — Encounter: Payer: Self-pay | Admitting: Family Medicine

## 2019-10-23 ENCOUNTER — Telehealth: Payer: Self-pay | Admitting: Family Medicine

## 2019-10-23 LAB — CBC WITH DIFFERENTIAL/PLATELET
Basophils Absolute: 0.1 10*3/uL (ref 0.0–0.2)
Basos: 1 %
EOS (ABSOLUTE): 0.3 10*3/uL (ref 0.0–0.4)
Eos: 4 %
Hematocrit: 42 % (ref 34.0–46.6)
Hemoglobin: 12.5 g/dL (ref 11.1–15.9)
Immature Grans (Abs): 0 10*3/uL (ref 0.0–0.1)
Immature Granulocytes: 0 %
Lymphocytes Absolute: 2.2 10*3/uL (ref 0.7–3.1)
Lymphs: 30 %
MCH: 21 pg — ABNORMAL LOW (ref 26.6–33.0)
MCHC: 29.8 g/dL — ABNORMAL LOW (ref 31.5–35.7)
MCV: 71 fL — ABNORMAL LOW (ref 79–97)
Monocytes Absolute: 0.6 10*3/uL (ref 0.1–0.9)
Monocytes: 9 %
Neutrophils Absolute: 4.1 10*3/uL (ref 1.4–7.0)
Neutrophils: 56 %
Platelets: 410 10*3/uL (ref 150–450)
RBC: 5.96 x10E6/uL — ABNORMAL HIGH (ref 3.77–5.28)
RDW: 18.4 % — ABNORMAL HIGH (ref 11.7–15.4)
WBC: 7.3 10*3/uL (ref 3.4–10.8)

## 2019-10-23 LAB — LIPID PANEL
Chol/HDL Ratio: 5.9 ratio — ABNORMAL HIGH (ref 0.0–4.4)
Cholesterol, Total: 242 mg/dL — ABNORMAL HIGH (ref 100–199)
HDL: 41 mg/dL (ref >39)
LDL Chol Calc (NIH): 124 mg/dL — ABNORMAL HIGH (ref 0–99)
Triglycerides: 435 mg/dL — ABNORMAL HIGH (ref 0–149)
VLDL Cholesterol Cal: 77 mg/dL — ABNORMAL HIGH (ref 5–40)

## 2019-10-23 LAB — HIV ANTIBODY (ROUTINE TESTING W REFLEX): HIV Screen 4th Generation wRfx: NONREACTIVE

## 2019-10-23 MED FILL — SM CLEARLAX POWDER: 17 | 30 days supply | Qty: 510 | Fill #1

## 2019-10-23 NOTE — Telephone Encounter (Signed)
Patients husband came and dropped off Immigration Lawyer forms at front desk for completion.  Verified that patient section of form has been completed.  Last DOS/WCC with PCP was 10-22-19.  Placed form in [facility] team folder to be completed by clinical staff.  Lori Sullivan

## 2019-10-23 NOTE — Telephone Encounter (Signed)
Form placed in Dr. Ewell Poe box.  Nothing for clinic staff to complete.  Ahlayah Tarkowski,CMA

## 2019-10-25 ENCOUNTER — Telehealth: Payer: Self-pay | Admitting: *Deleted

## 2019-10-25 NOTE — Telephone Encounter (Signed)
Husband states that he dropped off FMLA paperwork on 09/26/2019 at her appt.  It was for him to miss work when she is sick and he has to take care of her.  I am unable to locate this paperwork.  Will forward to PCP. Jone Baseman, CMA

## 2019-10-25 NOTE — Telephone Encounter (Signed)
Husband informed that there is nothing for out office to complete.  Copy made for batch scanning.  Original placed at front desk. Jone Baseman, CMA

## 2019-12-03 ENCOUNTER — Encounter: Payer: Self-pay | Admitting: Family Medicine

## 2019-12-03 ENCOUNTER — Other Ambulatory Visit: Payer: Self-pay

## 2019-12-03 ENCOUNTER — Ambulatory Visit (INDEPENDENT_AMBULATORY_CARE_PROVIDER_SITE_OTHER): Payer: 59 | Admitting: Family Medicine

## 2019-12-03 VITALS — BP 116/74 | HR 85 | Ht 60.0 in | Wt 139.0 lb

## 2019-12-03 DIAGNOSIS — R0683 Snoring: Secondary | ICD-10-CM | POA: Diagnosis not present

## 2019-12-03 DIAGNOSIS — M5432 Sciatica, left side: Secondary | ICD-10-CM

## 2019-12-03 DIAGNOSIS — K219 Gastro-esophageal reflux disease without esophagitis: Secondary | ICD-10-CM | POA: Diagnosis not present

## 2019-12-03 DIAGNOSIS — E782 Mixed hyperlipidemia: Secondary | ICD-10-CM | POA: Diagnosis not present

## 2019-12-03 MED ORDER — GABAPENTIN 100 MG PO CAPS: ORAL_CAPSULE | ORAL | 3 refills | Status: DC

## 2019-12-03 MED ORDER — FAMOTIDINE 20 MG PO TABS: 20.0000 mg | ORAL_TABLET | Freq: Every day | ORAL | 3 refills | Status: DC | PRN

## 2019-12-03 MED ORDER — ATORVASTATIN CALCIUM 40 MG PO TABS: 40.0000 mg | ORAL_TABLET | Freq: Every day | ORAL | 3 refills | Status: DC

## 2019-12-03 MED FILL — FAMOTIDINE 20 MG TABS: 20 | 90 days supply | Qty: 90 | Fill #0

## 2019-12-03 MED FILL — ATORVASTATIN 40 MG TABLET: 40 | 90 days supply | Qty: 90 | Fill #0

## 2019-12-03 MED FILL — GABAPENTIN 100 MG CAPSULE: 100 | 15 days supply | Qty: 90 | Fill #0

## 2019-12-03 NOTE — Assessment & Plan Note (Signed)
Patient with history of hyperlipidemia.  ASCVD risk is less than 5% however patient has no history of intolerance to statins and this treatment lowers her risk factor for CV event to <1%. -Restarting atorvastatin 40 mg daily -We will follow-up with patient in 1-2 months to make sure she is tolerating atorvastatin and not experiencing severe muscle pain, can lower dose at the time if needed

## 2019-12-03 NOTE — Assessment & Plan Note (Signed)
Patient has a history of GERD and abdominal pain, for which he used to take omeprazole.  Reports that this made her feel much better.  Patient would like to have omeprazole again so that she can take it as needed for abdominal pain. -We will prescribe famotidine 20 mg as needed daily instead of omeprazole for "as needed" control of acid reflux symptoms

## 2019-12-03 NOTE — Assessment & Plan Note (Signed)
Patient's husband remarks that she has been snoring a lot, especially when lying on her back.  She is snoring reduces when she rolls over onto her side.  However, she frequently experiences periods in which her breathing stops at night.  I offered the patient a chance to do a sleep study.  She respectfully declined. -Follow-up with [place] in 1-2 months

## 2019-12-03 NOTE — Progress Notes (Signed)
SUBJECTIVE:   Encounter was translated with the help of in person translator for [place]  CHIEF COMPLAINT / HPI:   Cholesterol: Patient presents to clinic today to follow-up for her elevated cholesterol levels on her lipid panel.  Patient was previously prescribed atorvastatin 40 mg.  She says that she has stopped taking this medication about 5 months ago for concern that it made her blood pressure go up.  She denies any issues with muscle aches while she was taking the medication.  I plan to the patient that this medication is not curative, but rather only keeps cholesterol levels lower while the medication is being taken.  The patient is interested in restarting this medication today.  Snoring: Per patient's husband, the patient has been snoring especially while lying on her back at night.  When he rolls onto her side she stops snoring and sleeps more peacefully. He also reports that she occasionally experiences periods when she stops breathing during the night, this occurs occasionally while she is sleeping and last between 2-10 seconds each time.  I explained to the patient that sleep apnea that goes untreated can increase the patient's risk for cardiovascular issues or strokes.  I offered to send the patient for a sleep study, but she respectfully declined.  She would much rather follow-up for this in a couple of months, states she will try to sleep on her side more to prevent snoring.  PERTINENT  PMH / PSH:  Essential hypertension Hyperlipidemia Lower extremity pain/sciatica  OBJECTIVE:   BP 116/74   Pulse 85   Ht 5' (1.524 m)   Wt 139 lb (63 kg)   SpO2 98%   BMI 27.15 kg/m    Physical exam: General: Well-appearing, pleasant patient HEENT: No thyromegaly, trachea midline, no lymphadenopathy Respiratory: CTA bilaterally, comfortable work of breathing Cardiac: RRR, S1-S2 present, no murmurs appreciated Abdomen: Normal bowel sounds appreciated  ASCVD Risk:  The 10-year ASCVD  risk score Mikey Bussing DC Jr., et al., 2013) is: 2.4%   Values used to calculate the score:     Age: 49 years     Sex: Female     Is Non-Hispanic African American: No     Diabetic: No     Tobacco smoker: No     Systolic Blood Pressure: 161 mmHg     Is BP treated: Yes     HDL Cholesterol: 41 mg/dL     Total Cholesterol: 242 mg/dL  Lipid Panel     Component Value Date/Time   CHOL 242 (H) 10/22/2019 1718   TRIG 435 (H) 10/22/2019 1718   HDL 41 10/22/2019 1718   CHOLHDL 5.9 (H) 10/22/2019 1718   CHOLHDL 6.8 12/04/2014 0942   VLDL 63 (H) 12/04/2014 0942   LDLCALC 124 (H) 10/22/2019 1718   LABVLDL 77 (H) 10/22/2019 1718    ASSESSMENT/PLAN:   GERD Patient has a history of GERD and abdominal pain, for which he used to take omeprazole.  Reports that this made her feel much better.  Patient would like to have omeprazole again so that she can take it as needed for abdominal pain. -We will prescribe famotidine 20 mg as needed daily instead of omeprazole for "as needed" control of acid reflux symptoms  Mixed hyperlipidemia Patient with history of hyperlipidemia.  ASCVD risk is less than 5% however patient has no history of intolerance to statins and this treatment lowers her risk factor for CV event to <1%. -Restarting atorvastatin 40 mg daily -We will follow-up with  patient in 1-2 months to make sure she is tolerating atorvastatin and not experiencing severe muscle pain, can lower dose at the time if needed   Snoring Patient's husband remarks that she has been snoring a lot, especially when lying on her back.  She is snoring reduces when she rolls over onto her side.  However, she frequently experiences periods in which her breathing stops at night.  I offered the patient a chance to do a sleep study.  She respectfully declined. -Follow-up with [place] in 1-2 months     Dollene Cleveland, DO [facility]

## 2020-01-10 ENCOUNTER — Other Ambulatory Visit: Payer: Self-pay | Admitting: Family Medicine

## 2020-01-10 DIAGNOSIS — R1013 Epigastric pain: Secondary | ICD-10-CM

## 2020-01-10 MED FILL — GABAPENTIN 100 MG CAPSULE: 100 | 15 days supply | Qty: 90 | Fill #1

## 2020-01-13 MED FILL — OMEPRAZOLE DR 20 MG CAPSULE: 20 | 30 days supply | Qty: 60 | Fill #0

## 2020-02-14 ENCOUNTER — Ambulatory Visit: Payer: 59 | Admitting: Family Medicine

## 2020-02-21 ENCOUNTER — Ambulatory Visit (INDEPENDENT_AMBULATORY_CARE_PROVIDER_SITE_OTHER): Payer: 59 | Admitting: Family Medicine

## 2020-02-21 ENCOUNTER — Other Ambulatory Visit: Payer: Self-pay

## 2020-02-21 ENCOUNTER — Encounter: Payer: Self-pay | Admitting: Family Medicine

## 2020-02-21 VITALS — BP 162/82 | HR 76 | Wt 139.2 lb

## 2020-02-21 DIAGNOSIS — K5900 Constipation, unspecified: Secondary | ICD-10-CM

## 2020-02-21 DIAGNOSIS — R1011 Right upper quadrant pain: Secondary | ICD-10-CM | POA: Diagnosis not present

## 2020-02-21 DIAGNOSIS — I10 Essential (primary) hypertension: Secondary | ICD-10-CM

## 2020-02-21 NOTE — Patient Instructions (Signed)

## 2020-02-21 NOTE — Progress Notes (Signed)
    SUBJECTIVE:   CHIEF COMPLAINT / HPI: Interpreter present  Abdominal Pain This is a new problem. The current episode started 1 to 4 weeks ago (pain started two weeks ago). The onset quality is gradual. The problem occurs constantly. The problem has been waxing and waning. The pain is located in the RUQ and LUQ (C/O RUQ and LUQ pain, right is worse). Associated symptoms include constipation. Pertinent negatives include no diarrhea, dysuria, hematochezia, hematuria, nausea or vomiting. Associated symptoms comments: She endorses associated fatigue. Sometimes she feels she need to catch her breath due to the pain and tightness in her belly. She endorses abdominal bloating. + weight loss. Exacerbated by: Certain fruits makes her constipated. She could not tell the name of the fruit. Relieved by: massaging her belly makes her feel better. She has tried H2 blockers and proton pump inhibitors for the symptoms. The treatment provided mild relief.  LMP: 10 or more years ago.  HTN: Did not take her meds this morning.   PERTINENT  PMH / PSH: PMX reviewed  OBJECTIVE:   BP (!) 162/82   Pulse 76   Wt 139 lb 3.2 oz (63.1 kg)   SpO2 98%   BMI 27.19 kg/m   Physical Exam Vitals and nursing note reviewed.  Constitutional:      Appearance: She is normal weight.  Cardiovascular:     Rate and Rhythm: Normal rate and regular rhythm.     Heart sounds: Normal heart sounds. No murmur heard.   Pulmonary:     Effort: Pulmonary effort is normal. No respiratory distress.     Breath sounds: Normal breath sounds. No stridor. No wheezing or rhonchi.  Abdominal:     General: Abdomen is flat. Bowel sounds are normal.     Palpations: Abdomen is soft.     Tenderness: There is abdominal tenderness in the right upper quadrant. There is no guarding or rebound. Negative signs include Murphy's sign, Rovsing's sign and McBurney's sign.  Neurological:     Mental Status: She is alert.      ASSESSMENT/PLAN:    Abdominal pain Mainly RUQ. Tylenol as needed for pain. Cmet to assess liver function. I will contact her with the result.  Constipation: Continue use of Miralax prn.  HTN: Elevated BP during this medication. However, she did not take her meds today. She did not come with her Norvasc bottle. Advised to continue home Norvasc. Return to PCP in 2 weeks with all medications. Continue home BP monitoring.    Janit Pagan, MD [facility]

## 2020-02-22 LAB — CMP14+EGFR
ALT: 21 IU/L (ref 0–32)
AST: 23 IU/L (ref 0–40)
Albumin/Globulin Ratio: 1.2 (ref 1.2–2.2)
Albumin: 4.6 g/dL (ref 3.8–4.8)
Alkaline Phosphatase: 102 IU/L (ref 48–121)
BUN/Creatinine Ratio: 12 (ref 9–23)
BUN: 11 mg/dL (ref 6–24)
Bilirubin Total: 0.5 mg/dL (ref 0.0–1.2)
CO2: 27 mmol/L (ref 20–29)
Calcium: 9.9 mg/dL (ref 8.7–10.2)
Chloride: 101 mmol/L (ref 96–106)
Creatinine, Ser: 0.91 mg/dL (ref 0.57–1.00)
GFR calc Af Amer: 86 mL/min/{1.73_m2} (ref >59)
GFR calc non Af Amer: 74 mL/min/{1.73_m2} (ref >59)
Globulin, Total: 3.8 g/dL (ref 1.5–4.5)
Glucose: 98 mg/dL (ref 65–99)
Potassium: 5 mmol/L (ref 3.5–5.2)
Sodium: 140 mmol/L (ref 134–144)
Total Protein: 8.4 g/dL (ref 6.0–8.5)

## 2020-02-24 ENCOUNTER — Telehealth: Payer: Self-pay | Admitting: Family Medicine

## 2020-02-24 NOTE — Telephone Encounter (Signed)
I attempted to reach her regarding her test result which was normal. However, pacific interpreter line do not have anyone to interpret for Lori Sullivan.  I see she has an upcoming appointment with her PCP. Dr. Dareen Piano can go over result with her then.

## 2020-03-02 ENCOUNTER — Other Ambulatory Visit: Payer: Self-pay | Admitting: Family Medicine

## 2020-03-02 ENCOUNTER — Other Ambulatory Visit: Payer: Self-pay

## 2020-03-02 ENCOUNTER — Ambulatory Visit (INDEPENDENT_AMBULATORY_CARE_PROVIDER_SITE_OTHER): Payer: 59 | Admitting: Family Medicine

## 2020-03-02 ENCOUNTER — Encounter: Payer: Self-pay | Admitting: Family Medicine

## 2020-03-02 VITALS — BP 128/76 | HR 72 | Ht 60.0 in | Wt 141.0 lb

## 2020-03-02 DIAGNOSIS — K5900 Constipation, unspecified: Secondary | ICD-10-CM | POA: Diagnosis not present

## 2020-03-02 DIAGNOSIS — M5432 Sciatica, left side: Secondary | ICD-10-CM | POA: Diagnosis not present

## 2020-03-02 DIAGNOSIS — K219 Gastro-esophageal reflux disease without esophagitis: Secondary | ICD-10-CM

## 2020-03-02 DIAGNOSIS — R1013 Epigastric pain: Secondary | ICD-10-CM | POA: Diagnosis not present

## 2020-03-02 DIAGNOSIS — E782 Mixed hyperlipidemia: Secondary | ICD-10-CM

## 2020-03-02 DIAGNOSIS — Z23 Encounter for immunization: Secondary | ICD-10-CM

## 2020-03-02 MED ORDER — AMLODIPINE BESYLATE 5 MG PO TABS: 5.0000 mg | ORAL_TABLET | Freq: Every day | ORAL | 3 refills | Status: DC

## 2020-03-02 MED ORDER — OMEPRAZOLE 20 MG PO CPDR: 20.0000 mg | DELAYED_RELEASE_CAPSULE | Freq: Every day | ORAL | 2 refills | Status: DC

## 2020-03-02 MED ORDER — GABAPENTIN 100 MG PO CAPS: ORAL_CAPSULE | ORAL | 3 refills | Status: DC

## 2020-03-02 MED ORDER — ATORVASTATIN CALCIUM 40 MG PO TABS: 40.0000 mg | ORAL_TABLET | Freq: Every day | ORAL | 3 refills | Status: DC

## 2020-03-02 MED ORDER — FAMOTIDINE 20 MG PO TABS: 20.0000 mg | ORAL_TABLET | Freq: Every day | ORAL | 3 refills | Status: DC | PRN

## 2020-03-02 MED FILL — OMEPRAZOLE DR 20 MG CAPSULE: 20 | 60 days supply | Qty: 60 | Fill #0

## 2020-03-02 MED FILL — GABAPENTIN 100 MG CAPSULE: 100 | 15 days supply | Qty: 90 | Fill #0

## 2020-03-02 MED FILL — FAMOTIDINE 20 MG TABS: 20 | 90 days supply | Qty: 90 | Fill #0

## 2020-03-02 MED FILL — ATORVASTATIN 40 MG TABLET: 40 | 90 days supply | Qty: 90 | Fill #0

## 2020-03-02 MED FILL — AMLODIPINE BESYLATE 5 MG TA: 5 | 90 days supply | Qty: 90 | Fill #0

## 2020-03-02 NOTE — Progress Notes (Signed)
   Covid-19 Vaccination Clinic  Name:  Lori Sullivan    MRN: [id number] DOB: 02-03-1971  03/02/2020  [name] was observed post Covid-19 immunization for 15 minutes without incident. She was provided with Vaccine Information Sheet and instruction to access the V-Safe system.   [name] was instructed to call 911 with any severe reactions post vaccine: Marland Kitchen Difficulty breathing  . Swelling of face and throat  . A fast heartbeat  . A bad rash all over body  . Dizziness and weakness   Veronda Prude, RN

## 2020-03-02 NOTE — Progress Notes (Signed)
    SUBJECTIVE:   CHIEF COMPLAINT / HPI:   Follow up abdominal pain: Patient reports that she continues to have  right-sided abdominal pain.  It is more so right middle and right lower abdomen, swings out towards her side.  Denies any back pain associated with it.  She has been taking MiraLAX 1 spoonful daily at nighttime, reports she does not take it more often or earlier during the day because she does not want to have to go to the bathroom a lot during work.  When she poops she does feel a little bit better.  She has not yet had a colonoscopy, it has been "many years" since she has had a Pap smear.  Patient had CMP drawn at last visit, lab results are normal and shared with patient.  No concern on physical exam today for gallbladder issues, liver issues, appendicitis.  Do not appreciate any masses on physical exam.  She denies any history of smoking or alcohol use.   Health Maintenance: due for Pap smear, Hep C screen, Covid vaccine.  Obtained her Covid vaccine today, would like to reschedule for Pap smear.  PERTINENT  PMH / PSH:  Patient Active Problem List   Diagnosis Date Noted  . Encounter for immunization 03/10/2020  . Snoring 12/03/2019  . Pain of left lower extremity 10/17/2019  . Sciatica of left side 03/28/2019  . Mixed hyperlipidemia 07/12/2018  . Abdominal pain 03/06/2018  . Bilateral leg pain 08/23/2016  . Thyromegaly 01/04/2015  . Lumbar pain 12/05/2014  . Essential hypertension 12/05/2014  . Internal and external bleeding hemorrhoids 02/20/2013  . GERD 05/28/2010  . Constipation 05/28/2010  . RECTAL BLEEDING 05/28/2010    OBJECTIVE:   BP 128/76   Pulse 72   Ht 5' (1.524 m)   Wt 141 lb (64 kg)   SpO2 98%   BMI 27.54 kg/m    Physical exam: General: Pleasant patient Respiratory: Speaking in complete sentences, comfortable work of breathing, CTA bilaterally Cardio: RRR, S1-S2 present, no murmurs appreciated Abdomen: Soft, nontender, can appreciate stool  burden, normal bowel sounds appreciated   ASSESSMENT/PLAN:   Constipation Strongly feel constipation, stool burden is contributing to patient's right sided abdominal pain. CMP from last visit within normal limits. Negative Murphy sign. No concerns for appendicitis, gallbladder etiology as cause of patient's pain. At this point cannot rule out ovarian cyst as potential cause of patient's pain although less likely. -Increase MiraLAX from 1 spoonful to 1.5 spoonfuls daily -patient encouraged to come back as soon as available for Pap smear  Encounter for immunization -Patient received first dose of COVID-19 vaccine at today's visit  Mixed hyperlipidemia ASCVD risk 2.9%. Patient already taking atorvastatin 40 -atorvastatin 40 refilled      Dollene Cleveland, DO [facility]

## 2020-03-02 NOTE — Patient Instructions (Addendum)
C?m ?n b?n ? ??n xem chng ti ngy hm nay! Vui lng xem bn d??i ?? xem l?i k? ho?ch c?a chng ti cho chuy?n th?m hm nay:   1. Hy quay l?i s?m nh?t c th? ?? l?y Pap Smear c?a b?n. 2. B?n s? nh?n ???c l?n ch?p COVID ??u tin c?a mnh hm nay - vui lng quay l?i sau 3 tu?n ?? nh?n l?n ch?p ti?p theo. 3. Ch?c n?ng gan c?a b?n v?n bnh th??ng! Gan c?a b?n khng gy ra ?au bn hng c?a b?n. C th? do to bn ?ang khi?n b?n b? ?au bn hng. U?ng 1,5 tha ??y Miralax m?i ngy ?? gip lm l?ng phn v gi?m p l?c.   1. Come back at your earliest convenience to get your Pap Smear.  2. You are getting your first COVID shot today - please come back in 3 weeks to get the next one. 3. Your liver function is normal! Your liver is not causing your side pain. It is possible that constipation is causing your side pain. Take 1.5 spoon fulls of Miralax daily to help loosen your stools and relieve pressure.   Vui lng g?i cho phng khm theo s? [phone] [phone] n?u cc tri?u ch?ng c?a b?n x?u ?i ho?c b?n c b?t k? lo ng?i no. R?t hn h?nh ???c ph?c v? qu khch!   Ti?n s? Peggyann [place] Thu?c gia ?nh [place]

## 2020-03-10 DIAGNOSIS — Z23 Encounter for immunization: Secondary | ICD-10-CM | POA: Insufficient documentation

## 2020-03-10 NOTE — Assessment & Plan Note (Signed)
Strongly feel constipation, stool burden is contributing to patient's right sided abdominal pain. CMP from last visit within normal limits. Negative Murphy sign. No concerns for appendicitis, gallbladder etiology as cause of patient's pain. At this point cannot rule out ovarian cyst as potential cause of patient's pain although less likely. -Increase MiraLAX from 1 spoonful to 1.5 spoonfuls daily -patient encouraged to come back as soon as available for Pap smear

## 2020-03-10 NOTE — Assessment & Plan Note (Addendum)
-  Patient received first dose of COVID-19 vaccine at today's visit

## 2020-03-10 NOTE — Assessment & Plan Note (Signed)
ASCVD risk 2.9%. Patient already taking atorvastatin 40 -atorvastatin 40 refilled

## 2020-03-13 ENCOUNTER — Other Ambulatory Visit: Payer: Self-pay | Admitting: Family Medicine

## 2020-03-16 MED FILL — SM CLEARLAX POWDER: 17 | 30 days supply | Qty: 510 | Fill #0

## 2020-03-29 ENCOUNTER — Encounter: Payer: Self-pay | Admitting: Family Medicine

## 2020-03-29 DIAGNOSIS — Z01419 Encounter for gynecological examination (general) (routine) without abnormal findings: Secondary | ICD-10-CM | POA: Insufficient documentation

## 2020-03-29 NOTE — Progress Notes (Signed)
    SUBJECTIVE:   CHIEF COMPLAINT / HPI:   Pap smear:  Patient presents to clinic today for pap smear. No report of any concerns, vaginal discharge, pelvic pain.  There is no record for when the patient's last Pap smear was.  Urinary frequency: Patient reports urinary frequency, also reports consuming a lot of water to stay hydrated.  Denies any dysuria, lower abdominal pain.  COVID Vaccine: patient presents for 2nd COVID vaccine. Received first dose 03/02/2020.    PERTINENT  PMH / PSH:  Patient Active Problem List   Diagnosis Date Noted  . Urinary frequency 03/30/2020  . Well woman exam with routine gynecological exam 03/29/2020  . Encounter for immunization 03/10/2020  . Snoring 12/03/2019  . Pain of left lower extremity 10/17/2019  . Sciatica of left side 03/28/2019  . Mixed hyperlipidemia 07/12/2018  . Abdominal pain 03/06/2018  . Bilateral leg pain 08/23/2016  . Thyromegaly 01/04/2015  . Lumbar pain 12/05/2014  . Essential hypertension 12/05/2014  . Internal and external bleeding hemorrhoids 02/20/2013  . GERD 05/28/2010  . Constipation 05/28/2010     OBJECTIVE:   BP 124/70   Pulse 78   Ht 5' (1.524 m)   Wt 139 lb (63 kg)   SpO2 98%   BMI 27.15 kg/m    Physical exam: General: Well-appearing patient, no apparent distress Respiratory: CTA bilaterally, comfortable work of breathing Cardio: Heart, S1-S2 present, no murmurs appreciated GU: External labia minora and majora without erythema or lesions, prominent vaginal rugae, normal-appearing cervix without lesion, no bleeding or vaginal discharge appreciated   ASSESSMENT/PLAN:   Encounter for immunization -Patient received second dose of COVID vaccine today  Well woman exam with routine gynecological exam -Pap smear performed today, will call with results and treat anything as needed  Urinary frequency -Patient reporting urinary frequency, most likely due to drinking more water recently to stay  hydrated -Checking urinalysis     Dollene Cleveland, DO [facility]

## 2020-03-30 ENCOUNTER — Encounter: Payer: Self-pay | Admitting: Family Medicine

## 2020-03-30 ENCOUNTER — Ambulatory Visit (INDEPENDENT_AMBULATORY_CARE_PROVIDER_SITE_OTHER): Payer: 59 | Admitting: Family Medicine

## 2020-03-30 ENCOUNTER — Other Ambulatory Visit (HOSPITAL_COMMUNITY)
Admission: RE | Admit: 2020-03-30 | Discharge: 2020-03-30 | Disposition: A | Discharge status: Home / Self Care | Payer: 59 | Source: Ambulatory Visit | Attending: Family Medicine | Admitting: Family Medicine

## 2020-03-30 ENCOUNTER — Other Ambulatory Visit: Payer: Self-pay

## 2020-03-30 VITALS — BP 124/70 | HR 78 | Ht 60.0 in | Wt 139.0 lb

## 2020-03-30 DIAGNOSIS — Z01419 Encounter for gynecological examination (general) (routine) without abnormal findings: Secondary | ICD-10-CM

## 2020-03-30 DIAGNOSIS — Z23 Encounter for immunization: Secondary | ICD-10-CM

## 2020-03-30 DIAGNOSIS — A599 Trichomoniasis, unspecified: Secondary | ICD-10-CM

## 2020-03-30 DIAGNOSIS — R350 Frequency of micturition: Secondary | ICD-10-CM

## 2020-03-30 NOTE — Assessment & Plan Note (Signed)
-  Patient reporting urinary frequency, most likely due to drinking more water recently to stay hydrated -Checking urinalysis

## 2020-03-30 NOTE — Assessment & Plan Note (Signed)
-  Pap smear performed today, will call with results and treat anything as needed

## 2020-03-30 NOTE — Assessment & Plan Note (Signed)
-  Patient received second dose of COVID vaccine today

## 2020-04-03 LAB — CYTOLOGY - PAP
Chlamydia: NEGATIVE
Comment: NEGATIVE
Comment: NEGATIVE
Comment: NEGATIVE
Comment: NORMAL
Diagnosis: NEGATIVE
High risk HPV: NEGATIVE
Neisseria Gonorrhea: NEGATIVE
Trichomonas: POSITIVE — AB

## 2020-04-08 ENCOUNTER — Other Ambulatory Visit: Payer: Self-pay | Admitting: Family Medicine

## 2020-04-08 DIAGNOSIS — A599 Trichomoniasis, unspecified: Secondary | ICD-10-CM | POA: Insufficient documentation

## 2020-04-08 MED ORDER — METRONIDAZOLE 500 MG PO TABS: 500.0000 mg | ORAL_TABLET | Freq: Two times a day (BID) | ORAL | 0 refills | Status: DC

## 2020-04-08 MED ORDER — METRONIDAZOLE 500 MG PO TABS: 500.0000 mg | ORAL_TABLET | Freq: Two times a day (BID) | ORAL | 0 refills | Status: AC

## 2020-04-08 MED FILL — metroNIDAZOLE 500 MG TABS: 500 | 7 days supply | Qty: 14 | Fill #0

## 2020-04-08 NOTE — Addendum Note (Signed)
Addended by: Dollene Cleveland on: 04/08/2020 01:19 PM   Modules accepted: Orders

## 2020-04-08 NOTE — Progress Notes (Addendum)
Patient's pap returned positive for Trichomonas. Called patient using pacific interpreter. Sent prescriptions for Flagyl 500mg  1 tablet twice daily for 7 days for her AND her husband to the [facility].   [facility], DO [facility] Family [facility], PGY-3

## 2020-04-20 MED FILL — metroNIDAZOLE 500 MG TABS: 500 | 7 days supply | Qty: 14 | Fill #0

## 2020-04-20 MED FILL — GABAPENTIN 100 MG CAPSULE: 100 | 15 days supply | Qty: 90 | Fill #1

## 2020-04-24 ENCOUNTER — Encounter: Payer: Self-pay | Admitting: Family Medicine

## 2020-04-24 ENCOUNTER — Ambulatory Visit (INDEPENDENT_AMBULATORY_CARE_PROVIDER_SITE_OTHER): Payer: 59 | Admitting: Family Medicine

## 2020-04-24 ENCOUNTER — Other Ambulatory Visit: Payer: Self-pay | Admitting: Family Medicine

## 2020-04-24 ENCOUNTER — Other Ambulatory Visit: Payer: Self-pay

## 2020-04-24 VITALS — BP 120/80 | HR 79 | Ht 60.0 in | Wt 137.0 lb

## 2020-04-24 DIAGNOSIS — Z23 Encounter for immunization: Secondary | ICD-10-CM

## 2020-04-24 DIAGNOSIS — M25552 Pain in left hip: Secondary | ICD-10-CM

## 2020-04-24 MED ORDER — NAPROXEN SODIUM 550 MG PO TABS: 550.0000 mg | ORAL_TABLET | Freq: Two times a day (BID) | ORAL | 0 refills | Status: DC

## 2020-04-24 MED FILL — NAPROXEN SODIUM 550 MG TABS: 550 | 7 days supply | Qty: 14 | Fill #0

## 2020-04-24 NOTE — Progress Notes (Signed)
    SUBJECTIVE:   CHIEF COMPLAINT / HPI:   Left Hip Pain Patient states she was cleaning her house on Sunday (6 days ago) and lifted something heavy. She felt minimal pain in her L hip region at that time, but the pain got worse on Monday. Patient was able to work without difficulty on Monday and Tuesday (she works as a Agricultural engineer), but on Wednesday the pain had become severe that she was unable to weight-bear/walk. She is unable to put any pressure on that side and therefore can't sit normally and can't lay on her left.  The pain is located in her L lateral hip and radiates down the side of her L leg to her ankle. Described as a throbbing. She has a history of sciatica and states this feels similar although much more severe. She has been taking Gabapentin and using a heating pad without relief. No numbness or weakness in her L leg, no incontinence, saddle anesthesia, or red flags on history.  In person interpreter used for the entirety of the visit.  PERTINENT  PMH / PSH: sciatica, HTN, HLD, GERD  OBJECTIVE:   BP 120/80   Pulse 79   Ht 5' (1.524 m)   Wt 137 lb (62.1 kg)   SpO2 98%   BMI 26.76 kg/m   Gen: alert, non-toxic appearing Neck: nontender, supple, full ROM Back: no bony tenderness, no paraspinal tenderness, negative straight leg raise L hip: Moderate tenderness over greater trochanteric bursa. Patient leaning to the right in chair, unable to apply any pressure to L hip/gluteal region. Sensation intact throughout bilateral lower extremities. No erythema, warmth or swelling of the hip joint. Severely antalgic gait 2/2 inability to weight bear. Normal exam of L knee.   ASSESSMENT/PLAN:   Lateral pain of left hip Given that patient's pain is laterally located, is tender to palpation, and she has a negative straight leg raise, I do not think this is related to patient's sciatica. Most likely diagnosis is trochanteric bursitis. Due to patient's inability to weight bear, I am also  concerned about possible gluteus tendon tear or less likely gluteus medius tear. -Naproxen 550mg  BID x7 days -MRI scheduled for 10/17. If patient is improved with Naproxen and conservative management, she will call to cancel MRI -I will follow-up with her via phone next week   Discussed with Dr. 11/17.   Lum Babe, MD [facility]

## 2020-04-24 NOTE — Patient Instructions (Signed)
Our plans for today:  - Start taking Naproxen 550mg  twice daily - You can apply ice to the area to help with pain - You are scheduled for an MRI on Sunday, October 17th at 10:30am. Please check-in at the Emergency Department - If your pain is improved, please call [phone] to cancel the MRI.  I hope your hip feels better soon!  Dr. [phone] Family Medicine

## 2020-04-25 DIAGNOSIS — M25552 Pain in left hip: Secondary | ICD-10-CM | POA: Insufficient documentation

## 2020-04-25 NOTE — Assessment & Plan Note (Signed)
Given that patient's pain is laterally located, is tender to palpation, and she has a negative straight leg raise, I do not think this is related to patient's sciatica. Most likely diagnosis is trochanteric bursitis. Due to patient's inability to weight bear, I am also concerned about possible gluteus tendon tear or less likely gluteus medius tear. -Naproxen 550mg  BID x7 days -MRI scheduled for 10/17. If patient is improved with Naproxen and conservative management, she will call to cancel MRI

## 2020-05-01 ENCOUNTER — Other Ambulatory Visit: Payer: Self-pay

## 2020-05-01 DIAGNOSIS — M25552 Pain in left hip: Secondary | ICD-10-CM

## 2020-05-03 ENCOUNTER — Ambulatory Visit (HOSPITAL_COMMUNITY): Admission: RE | Admit: 2020-05-03 | Payer: 59 | Source: Ambulatory Visit

## 2020-05-03 MED ORDER — NAPROXEN SODIUM 550 MG PO TABS: 550.0000 mg | ORAL_TABLET | Freq: Two times a day (BID) | ORAL | 0 refills | Status: DC | PRN

## 2020-05-04 MED FILL — NAPROXEN SODIUM 550 MG TABS: 550 | 7 days supply | Qty: 14 | Fill #0

## 2020-05-15 MED FILL — POLYETHYLENE GLYCOL 3350 PO: 17 | 30 days supply | Qty: 510 | Fill #1

## 2020-05-25 MED FILL — OMEPRAZOLE DR 20 MG CAPSULE: 20 | 60 days supply | Qty: 60 | Fill #1

## 2020-05-25 MED FILL — AMLODIPINE BESYLATE 5 MG TA: 5 | 90 days supply | Qty: 90 | Fill #1

## 2020-06-10 ENCOUNTER — Other Ambulatory Visit: Payer: Self-pay | Admitting: Family Medicine

## 2020-06-10 DIAGNOSIS — M25552 Pain in left hip: Secondary | ICD-10-CM

## 2020-06-10 MED FILL — GABAPENTIN 100 MG CAPSULE: 100 | 15 days supply | Qty: 90 | Fill #2

## 2020-06-10 MED FILL — ATORVASTATIN 40 MG TABLET: 40 | 90 days supply | Qty: 90 | Fill #1

## 2020-06-12 ENCOUNTER — Other Ambulatory Visit: Payer: Self-pay | Admitting: Family Medicine

## 2020-06-12 DIAGNOSIS — M25552 Pain in left hip: Secondary | ICD-10-CM

## 2020-06-16 ENCOUNTER — Other Ambulatory Visit: Payer: Self-pay | Admitting: Family Medicine

## 2020-06-16 MED FILL — NAPROXEN SODIUM 550 MG TABS: 550 | 7 days supply | Qty: 14 | Fill #0

## 2020-06-30 MED FILL — OMEPRAZOLE DR 20 MG CAPSULE: 20 | 60 days supply | Qty: 60 | Fill #2

## 2020-07-14 ENCOUNTER — Other Ambulatory Visit: Payer: Self-pay | Admitting: Family Medicine

## 2020-07-14 DIAGNOSIS — M25552 Pain in left hip: Secondary | ICD-10-CM

## 2020-07-15 ENCOUNTER — Other Ambulatory Visit: Payer: Self-pay | Admitting: Family Medicine

## 2020-07-15 MED FILL — NAPROXEN SODIUM 550 MG TABS: 550 | 7 days supply | Qty: 14 | Fill #0

## 2020-07-29 ENCOUNTER — Other Ambulatory Visit: Payer: Self-pay | Admitting: Family Medicine

## 2020-07-29 DIAGNOSIS — M25552 Pain in left hip: Secondary | ICD-10-CM

## 2020-07-30 ENCOUNTER — Other Ambulatory Visit: Payer: Self-pay | Admitting: Family Medicine

## 2020-07-30 DIAGNOSIS — A599 Trichomoniasis, unspecified: Secondary | ICD-10-CM

## 2020-07-31 MED FILL — NAPROXEN SODIUM 550 MG TABS: 550 | 30 days supply | Qty: 60 | Fill #0

## 2020-08-14 ENCOUNTER — Other Ambulatory Visit: Payer: Self-pay | Admitting: Family Medicine

## 2020-08-15 ENCOUNTER — Other Ambulatory Visit: Payer: Self-pay | Admitting: Family Medicine

## 2020-08-15 MED FILL — POLYETHYLENE GLYCOL 3350 PO: 17 | 30 days supply | Qty: 510 | Fill #0

## 2020-08-21 ENCOUNTER — Other Ambulatory Visit: Payer: Self-pay | Admitting: Family Medicine

## 2020-08-21 DIAGNOSIS — R1013 Epigastric pain: Secondary | ICD-10-CM

## 2020-08-21 MED FILL — OMEPRAZOLE DR 20 MG CAPSULE: 20 | 60 days supply | Qty: 60 | Fill #0

## 2020-09-23 MED FILL — ATORVASTATIN 40 MG TABLET: 40 | 90 days supply | Qty: 90 | Fill #2

## 2020-09-23 MED FILL — AMLODIPINE BESYLATE 5 MG TA: 5 | 90 days supply | Qty: 90 | Fill #2

## 2020-10-16 MED FILL — OMEPRAZOLE DR 20 MG CAPSULE: 20 | 60 days supply | Qty: 60 | Fill #1

## 2021-01-04 ENCOUNTER — Ambulatory Visit (INDEPENDENT_AMBULATORY_CARE_PROVIDER_SITE_OTHER): Payer: 59

## 2021-01-04 ENCOUNTER — Other Ambulatory Visit: Payer: Self-pay

## 2021-01-04 ENCOUNTER — Encounter: Payer: Self-pay | Admitting: Student in an Organized Health Care Education/Training Program

## 2021-01-04 ENCOUNTER — Ambulatory Visit (INDEPENDENT_AMBULATORY_CARE_PROVIDER_SITE_OTHER): Payer: 59 | Admitting: Student in an Organized Health Care Education/Training Program

## 2021-01-04 DIAGNOSIS — M5431 Sciatica, right side: Secondary | ICD-10-CM | POA: Diagnosis not present

## 2021-01-04 DIAGNOSIS — Z23 Encounter for immunization: Secondary | ICD-10-CM

## 2021-01-04 DIAGNOSIS — M5432 Sciatica, left side: Secondary | ICD-10-CM

## 2021-01-04 NOTE — Patient Instructions (Signed)
It was a pleasure to see you today!  To summarize our discussion for this visit: Please complete these exercises twice daily as you feel able for a month. If not better, you can return for another evaluation and can consider a steroid injection  Some additional health maintenance measures we should update are: Health Maintenance Due  Topic Date Due   Hepatitis C Screening  Never done   COVID-19 Vaccine (3 - Booster for Pfizer series) 08/30/2020   MAMMOGRAM  11/19/2020   Zoster Vaccines- Shingrix (1 of 2) Never done     Please return to our clinic to see me as needed.  Call the clinic at [phone] if your symptoms worsen or you have any concerns.   Thank you for allowing me to take part in your care,  Dr. Jamelle Rushing  Sciatica Rehab Ask your health care provider which exercises are safe for you. Do exercises exactly as told by your health care provider and adjust them as directed. It is normal to feel mild stretching, pulling, tightness, or discomfort as you do these exercises. Stop right away if you feel sudden pain or your pain gets worse. Do not begin these exercises until told by your health care provider. Stretching and range-of-motion exercises These exercises warm up your muscles and joints and improve the movement and flexibility of your hips and back. These exercises also help to relieve pain,numbness, and tingling. Sciatic nerve glide Sit in a chair with your head facing down toward your chest. Place your hands behind your back. Let your shoulders slump forward. Slowly straighten one of your legs while you tilt your head back as if you are looking toward the ceiling. Only straighten your leg as far as you can without making your symptoms worse. Hold this position for __________ seconds. Slowly return to the starting position. Repeat with your other leg. Repeat __________ times. Complete this exercise __________ times a day. Knee to chest with hip adduction and  internal rotation  Lie on your back on a firm surface with both legs straight. Bend one of your knees and move it up toward your chest until you feel a gentle stretch in your lower back and buttock. Then, move your knee toward the shoulder that is on the opposite side from your leg. This is hip adduction and internal rotation. Hold your leg in this position by holding on to the front of your knee. Hold this position for __________ seconds. Slowly return to the starting position. Repeat with your other leg. Repeat __________ times. Complete this exercise __________ times a day. Prone extension on elbows  Lie on your abdomen on a firm surface. A bed may be too soft for this exercise. Prop yourself up on your elbows. Use your arms to help lift your chest up until you feel a gentle stretch in your abdomen and your lower back. This will place some of your body weight on your elbows. If this is uncomfortable, try stacking pillows under your chest. Your hips should stay down, against the surface that you are lying on. Keep your hip and back muscles relaxed. Hold this position for __________ seconds. Slowly relax your upper body and return to the starting position. Repeat __________ times. Complete this exercise __________ times a day. Strengthening exercises These exercises build strength and endurance in your back. Endurance is theability to use your muscles for a long time, even after they get tired. Pelvic tilt This exercise strengthens the muscles that lie deep in the abdomen.  Lie on your back on a firm surface. Bend your knees and keep your feet flat on the floor. Tense your abdominal muscles. Tip your pelvis up toward the ceiling and flatten your lower back into the floor. To help with this exercise, you may place a small towel under your lower back and try to push your back into the towel. Hold this position for __________ seconds. Let your muscles relax completely before you repeat this  exercise. Repeat __________ times. Complete this exercise __________ times a day. Alternating arm and leg raises  Get on your hands and knees on a firm surface. If you are on a hard floor, you may want to use padding, such as an exercise mat, to cushion your knees. Line up your arms and legs. Your hands should be directly below your shoulders, and your knees should be directly below your hips. Lift your left leg behind you. At the same time, raise your right arm and straighten it in front of you. Do not lift your leg higher than your hip. Do not lift your arm higher than your shoulder. Keep your abdominal and back muscles tight. Keep your hips facing the ground. Do not arch your back. Keep your balance carefully, and do not hold your breath. Hold this position for __________ seconds. Slowly return to the starting position. Repeat with your right leg and your left arm. Repeat __________ times. Complete this exercise __________ times a day. Posture and body mechanics Good posture and healthy body mechanics can help to relieve stress in your body's tissues and joints. Body mechanics refers to the movements and positions of your body while you do your daily activities. Posture is part of body mechanics. Good posture means: Your spine is in its natural S-curve position (neutral). Your shoulders are pulled back slightly. Your head is not tipped forward. Follow these guidelines to improve your posture and body mechanics in youreveryday activities. Standing  When standing, keep your spine neutral and your feet about hip width apart. Keep a slight bend in your knees. Your ears, shoulders, and hips should line up. When you do a task in which you stand in one place for a long time, place one foot up on a stable object that is 2-4 inches (5-10 cm) high, such as a footstool. This helps keep your spine neutral.  Sitting  When sitting, keep your spine neutral and keep your feet flat on the floor. Use  a footrest, if necessary, and keep your thighs parallel to the floor. Avoid rounding your shoulders, and avoid tilting your head forward. When working at a desk or a computer, keep your desk at a height where your hands are slightly lower than your elbows. Slide your chair under your desk so you are close enough to maintain good posture. When working at a computer, place your monitor at a height where you are looking straight ahead and you do not have to tilt your head forward or downward to look at the screen.  Resting When lying down and resting, avoid positions that are most painful for you. If you have pain with activities such as sitting, bending, stooping, or squatting, lie in a position in which your body does not bend very much. For example, avoid curling up on your side with your arms and knees near your chest (fetal position). If you have pain with activities such as standing for a long time or reaching with your arms, lie with your spine in a neutral position and bend  your knees slightly. Try the following positions: Lying on your side with a pillow between your knees. Lying on your back with a pillow under your knees. Lifting  When lifting objects, keep your feet at least shoulder width apart and tighten your abdominal muscles. Bend your knees and hips and keep your spine neutral. It is important to lift using the strength of your legs, not your back. Do not lock your knees straight out. Always ask for help to lift heavy or awkward objects.  This information is not intended to replace advice given to you by your health care provider. Make sure you discuss any questions you have with your healthcare provider. Document Revised: 10/26/2018 Document Reviewed: 07/26/2018 Elsevier Patient Education  2022 [employer].

## 2021-01-04 NOTE — Assessment & Plan Note (Addendum)
History and physical remain consistent with sciatica which appears to be present bilaterally now.  Patient has not been adherent with exercises or medications.  Provided patient with option for home vs formal PT exercises and she chose home. Given a pamphlet with rehab exercises and went through them with interpretor and had patient demonstrate she knew how to do them. Repeat BID x4 weeks and return if not improved to consider steroid injections and/or formal PT vs imaging

## 2021-01-04 NOTE — Progress Notes (Signed)
   SUBJECTIVE:   CHIEF COMPLAINT / HPI: check up on woman problem and sciatica   Woman problem- no concerns today but thought she was due for pap smear.   Sciatica- has taken gabapentin in the past but not taking any more. She is now taking an over the counter medication- alpha lipoic acid and glucosamine. She has not been doing any at home exercises that were recommended about a year ago and never tried them. Pain was previously only on left side but now feels it on both sides. Starts at lower back sides and travels down her legs. Has been decreasing her activity level because she has pain and numbness in her legs. No weakness, no incontinence.  OBJECTIVE:   BP 118/86   Pulse 74   Ht 5' (1.524 m)   Wt 143 lb (64.9 kg)   SpO2 98%   BMI 27.93 kg/m   Physical Exam Vitals and nursing note reviewed. Exam conducted with a chaperone present.  Constitutional:      General: She is not in acute distress.    Appearance: Normal appearance. She is not ill-appearing or toxic-appearing.  HENT:     Head: Normocephalic.  Cardiovascular:     Rate and Rhythm: Normal rate and regular rhythm.  Pulmonary:     Effort: Pulmonary effort is normal.  Musculoskeletal:        General: No swelling. Normal range of motion.     Thoracic back: Normal.     Lumbar back: Normal. No bony tenderness. Negative right straight leg raise test and negative left straight leg raise test.     Right hip: Normal. No bony tenderness. Normal range of motion. Normal strength.     Left hip: Normal. No bony tenderness. Normal range of motion. Normal strength.     Right upper leg: No tenderness.     Left upper leg: No tenderness.     Right lower leg: No edema.     Left lower leg: No edema.     Comments: Tenderness to palpation of SI joint areas but not exacerbated by FABER. Negative tenderness to IT band or trochanteric bursa area.  Negative leg roll, FABER, FADIR, SLR bilaterally. Gait is normal  Skin:    Capillary Refill:  Capillary refill takes less than 2 seconds.  Neurological:     Mental Status: She is alert.  Psychiatric:        Mood and Affect: Mood normal.        Behavior: Behavior normal.   ASSESSMENT/PLAN:   Bilateral sciatica History and physical remain consistent with sciatica which appears to be present bilaterally now.  Patient has not been adherent with exercises or medications.  Provided patient with option for home vs formal PT exercises and she chose home. Given a pamphlet with rehab exercises and went through them with interpretor and had patient demonstrate she knew how to do them. Repeat BID x4 weeks and return if not improved to consider steroid injections and/or formal PT vs imaging     Leeroy Bock, DO [facility]

## 2021-01-22 ENCOUNTER — Other Ambulatory Visit (HOSPITAL_COMMUNITY): Payer: Self-pay

## 2021-01-22 MED FILL — Omeprazole Cap Delayed Release 20 MG: ORAL | 60 days supply | Qty: 60 | Fill #0 | Status: AC

## 2021-01-22 MED FILL — Amlodipine Besylate Tab 5 MG (Base Equivalent): ORAL | 90 days supply | Qty: 90 | Fill #0 | Status: AC

## 2021-01-25 ENCOUNTER — Other Ambulatory Visit (HOSPITAL_COMMUNITY): Payer: Self-pay

## 2021-03-23 ENCOUNTER — Other Ambulatory Visit: Payer: Self-pay

## 2021-03-24 ENCOUNTER — Other Ambulatory Visit (HOSPITAL_COMMUNITY): Payer: Self-pay

## 2021-03-25 ENCOUNTER — Other Ambulatory Visit (HOSPITAL_COMMUNITY): Payer: Self-pay

## 2021-03-25 ENCOUNTER — Other Ambulatory Visit: Payer: Self-pay | Admitting: Family Medicine

## 2021-03-25 DIAGNOSIS — R1013 Epigastric pain: Secondary | ICD-10-CM

## 2021-03-26 ENCOUNTER — Other Ambulatory Visit (HOSPITAL_COMMUNITY): Payer: Self-pay

## 2021-03-26 MED ORDER — OMEPRAZOLE 20 MG PO CPDR
20.0000 mg | DELAYED_RELEASE_CAPSULE | Freq: Every day | ORAL | 2 refills | Status: DC
  Filled 2021-03-26: qty 60, 60d supply, fill #0
  Filled 2021-07-29: qty 30, 30d supply, fill #1
  Filled 2021-09-13: qty 30, 30d supply, fill #2
  Filled 2021-10-08: qty 30, 30d supply, fill #3
  Filled 2021-11-10: qty 30, 30d supply, fill #4

## 2021-03-29 ENCOUNTER — Other Ambulatory Visit: Payer: Self-pay | Admitting: Family Medicine

## 2021-03-29 ENCOUNTER — Other Ambulatory Visit (HOSPITAL_COMMUNITY): Payer: Self-pay

## 2021-03-29 DIAGNOSIS — E782 Mixed hyperlipidemia: Secondary | ICD-10-CM

## 2021-03-29 MED ORDER — AMLODIPINE BESYLATE 5 MG PO TABS
5.0000 mg | ORAL_TABLET | Freq: Every day | ORAL | 3 refills | Status: DC
  Filled 2021-03-29: qty 90, fill #0

## 2021-03-29 MED ORDER — ATORVASTATIN CALCIUM 40 MG PO TABS
40.0000 mg | ORAL_TABLET | Freq: Every day | ORAL | 3 refills | Status: DC
  Filled 2021-03-29: qty 90, 90d supply, fill #0

## 2021-03-30 ENCOUNTER — Other Ambulatory Visit (HOSPITAL_COMMUNITY): Payer: Self-pay

## 2021-04-07 ENCOUNTER — Other Ambulatory Visit (HOSPITAL_COMMUNITY): Payer: Self-pay

## 2021-04-16 ENCOUNTER — Other Ambulatory Visit (HOSPITAL_COMMUNITY): Payer: Self-pay

## 2021-07-13 ENCOUNTER — Other Ambulatory Visit: Payer: Self-pay

## 2021-07-13 ENCOUNTER — Ambulatory Visit: Payer: 59 | Admitting: Student

## 2021-07-13 ENCOUNTER — Ambulatory Visit (INDEPENDENT_AMBULATORY_CARE_PROVIDER_SITE_OTHER): Payer: 59 | Admitting: Family Medicine

## 2021-07-13 ENCOUNTER — Other Ambulatory Visit (HOSPITAL_COMMUNITY): Payer: Self-pay

## 2021-07-13 VITALS — BP 151/83 | HR 80 | Ht 60.0 in | Wt 144.0 lb

## 2021-07-13 DIAGNOSIS — I10 Essential (primary) hypertension: Secondary | ICD-10-CM

## 2021-07-13 DIAGNOSIS — Z13220 Encounter for screening for lipoid disorders: Secondary | ICD-10-CM

## 2021-07-13 DIAGNOSIS — E119 Type 2 diabetes mellitus without complications: Secondary | ICD-10-CM | POA: Insufficient documentation

## 2021-07-13 DIAGNOSIS — Z1231 Encounter for screening mammogram for malignant neoplasm of breast: Secondary | ICD-10-CM | POA: Diagnosis not present

## 2021-07-13 DIAGNOSIS — Z0000 Encounter for general adult medical examination without abnormal findings: Secondary | ICD-10-CM | POA: Diagnosis not present

## 2021-07-13 DIAGNOSIS — Z1159 Encounter for screening for other viral diseases: Secondary | ICD-10-CM

## 2021-07-13 DIAGNOSIS — R7303 Prediabetes: Secondary | ICD-10-CM | POA: Diagnosis not present

## 2021-07-13 DIAGNOSIS — Z23 Encounter for immunization: Secondary | ICD-10-CM

## 2021-07-13 DIAGNOSIS — Z113 Encounter for screening for infections with a predominantly sexual mode of transmission: Secondary | ICD-10-CM | POA: Insufficient documentation

## 2021-07-13 DIAGNOSIS — M7630 Iliotibial band syndrome, unspecified leg: Secondary | ICD-10-CM | POA: Insufficient documentation

## 2021-07-13 LAB — POCT GLYCOSYLATED HEMOGLOBIN (HGB A1C): Hemoglobin A1C: 6.1 % — AB (ref 4.0–5.6)

## 2021-07-13 MED ORDER — ZOSTER VAC RECOMB ADJUVANTED 50 MCG/0.5ML IM SUSR
0.5000 mL | Freq: Once | INTRAMUSCULAR | 1 refills | Status: AC
  Filled 2021-07-13 – 2021-07-14 (×2): qty 0.5, 1d supply, fill #0

## 2021-07-13 MED ORDER — AMLODIPINE BESYLATE 5 MG PO TABS
5.0000 mg | ORAL_TABLET | Freq: Every day | ORAL | 3 refills | Status: DC
  Filled 2021-07-13: qty 90, 90d supply, fill #0
  Filled 2021-11-10: qty 90, 90d supply, fill #1
  Filled 2022-03-28: qty 90, 90d supply, fill #2

## 2021-07-13 NOTE — Assessment & Plan Note (Signed)
Bilaterally. Tenderness to palpation of IT band bilaterally. Pain not coming from back- think less likely sciatica. No red flag symptoms. Negative straight leg raise. Would benefit from PT but notes that she works often and would not be able to make PT sessions. -Exercises provided

## 2021-07-13 NOTE — Assessment & Plan Note (Signed)
Patient desires STI screening.  However, unable to obtain these labs prior to lab closing today. -Obtain HIV, RPR, hep B, GC, chlamydia, and trichomonas labs at follow-up

## 2021-07-13 NOTE — Assessment & Plan Note (Signed)
Reports she has run out of her Amlodipine for the last 3 months. BP elevated today.  -Refill for Amlodipine sent -F/u in 2 weeks for BP recheck

## 2021-07-13 NOTE — Assessment & Plan Note (Signed)
>>  ASSESSMENT AND PLAN FOR PREDIABETES WRITTEN ON 07/13/2021  5:36 PM BY ESPINOZA, ALEJANDRA, DO  Hemoglobin A1c 6.1 today, slightly worse than previous. We discussed lifestyle modifications to help prevent progression to diabetes.

## 2021-07-13 NOTE — Progress Notes (Deleted)
° ° °  SUBJECTIVE:   Montagnard interpreter: *** used for entirety of examination.   Chief compliant/HPI: annual examination  Lori Sullivan is a 50 y.o. who presents today for an annual exam.    History tabs reviewed and updated ***.   Review of systems form reviewed and notable for ***.   OBJECTIVE:   There were no vitals taken for this visit. *** General: Awake, alert, oriented, in no acute distress, pleasant and cooperative with examination HEENT: Normocephalic, atraumatic, nares patent, dentition is good, oropharynx without erythema or exudates, TM's clear bilaterally, no thyroid nodules palpated Cardio: RRR without murmur, 2+ radial, DP and PT pulses b/l Respiratory: CTAB without wheezing/rhonchi/rales Abdomen: Soft, non-tender to palpation of all quadrants, non-distended, no rebound/guarding, no organomegaly MSK: Able to move all extremities spontaneously, good muscle strength, no abnormalities Extremities: without edema or cyanosis Neuro: Speech is clear and intact, no focal deficits, no facial asymmetry, follows commands  Psych: Normal mood and affect   ASSESSMENT/PLAN:   No problem-specific Assessment & Plan notes found for this encounter.    Annual Examination  See AVS for age appropriate recommendations  PHQ score ***, reviewed and discussed.  BP reviewed and at goal ***.  Asked about intimate partner violence and resources given as appropriate  Advance directives discussion ***  Considered the following items based upon USPSTF recommendations: Diabetes screening: ordered Screening for elevated cholesterol: ordered HIV testing: {discussed/ordered:14545} Hepatitis C: ordered Hepatitis B: {discussed/ordered:14545} Syphilis if at high risk: {discussed/ordered:14545} GC/CT {GC/CT screening :23818} Osteoporosis screening considered based upon risk of fracture from [facility] calculator. Major osteoporotic fracture risk is ***%. DEXA {ordered not order:23822}.  Reviewed  risk factors for latent tuberculosis and {not indicated/requested/declined:14582}   Discussed family history, BRCA testing {not indicated/requested/declined:14582}. Tool used to risk stratify was ***.  Cervical cancer screening: prior Pap reviewed, repeat due in 03/2025 Breast cancer screening:  ordered Colorectal cancer screening: up to date on screening for CRC. Had colonoscopy Jan 2021 which was only significant for bleeding internal hemorrhoids.  Was recommended for repeat colonoscopy in 10 years (due Jan 2031) Lung cancer screening: {discussed/declined/written [facility]:94709}. See documentation below regarding indications/risks/benefits.  Vaccinations Influenza, COVID.*** Shingrix sent to pharmacy.    Follow up in 1  year or sooner if indicated.    Sharion Settler, [place]

## 2021-07-13 NOTE — Progress Notes (Signed)
° ° °SUBJECTIVE:  ° °In-person Montagnard interpreter, Ykeo, used for entirety of examination.  ° °Chief compliant/HPI: annual examination ° °Lori Sullivan is a 50 y.o. who presents today for an annual exam.  °Concerns include: b/l leg pain that shoots down the lateral aspect of legs. Has been ongoing for years. Warm water and massages help. Nothing makes it worse. Pain comes and goes. Works at a nail salon. Able to do all normal daily activities. No back injuries. Pain wakes her up from sleep. No bowel or bladder incontinence. No saddle anesthesia.  ° °She is unsure of her medications, did not bring them with her. Believes she is taking Amlodipine and Gabapentin. She states she ran out of Amlodipine 3 months ago. There are two others but she is unsure of which. ° °History tabs reviewed and updated.  ° °Review of systems form reviewed and notable for leg pain.  ° °OBJECTIVE:  ° °BP (!) 151/83    Pulse 80    Ht 5' (1.524 m)    Wt 144 lb (65.3 kg)    SpO2 100%    BMI 28.12 kg/m²   °General: Awake, alert, oriented, in no acute distress, pleasant and cooperative with examination °HEENT: Normocephalic, atraumatic, nares patent, dentition is fair, oropharynx without erythema or exudates, TM's clear bilaterally, no thyroid nodules palpated °Cardio: RRR without murmur, 2+ radial, DP and PT pulses b/l °Respiratory: CTAB without wheezing/rhonchi/rales °Abdomen: Soft, non-tender to palpation of all quadrants, non-distended, no rebound/guarding, no organomegaly °MSK: Able to move all extremities spontaneously, good muscle strength, no abnormalities. Straight leg raise negative bilaterally. Pain to IT band b/l. °Extremities: without edema or cyanosis °Neuro: Speech is clear and intact, no focal deficits, no facial asymmetry, follows commands  °Psych: Normal mood and affect ° °Depression screen PHQ 2/9 07/13/2021 01/04/2021 04/24/2020  °Decreased Interest 0 1 0  °Down, Depressed, Hopeless 0 1 0  °PHQ - 2 Score 0 2 0  °Altered  sleeping 0 1 0  °Tired, decreased energy 0 1 0  °Change in appetite 0 1 0  °Feeling bad or failure about yourself  0 1 0  °Trouble concentrating 0 1 0  °Moving slowly or fidgety/restless 0 1 0  °Suicidal thoughts 0 0 0  °PHQ-9 Score 0 8 0  °Difficult doing work/chores - - -  °Some recent data might be hidden  ° ° °ASSESSMENT/PLAN:  ° °IT band syndrome °Bilaterally. Tenderness to palpation of IT band bilaterally. Pain not coming from back- think less likely sciatica. No red flag symptoms. Negative straight leg raise. Would benefit from PT but notes that she works often and would not be able to make PT sessions. °-Exercises provided  ° °Essential hypertension °Reports she has run out of her Amlodipine for the last 3 months. BP elevated today.  °-Refill for Amlodipine sent °-F/u in 2 weeks for BP recheck ° °Routine screening for STI (sexually transmitted infection) °Patient desires STI screening.  However, unable to obtain these labs prior to lab closing today. °-Obtain HIV, RPR, hep B, GC, chlamydia, and trichomonas labs at follow-up ° °Prediabetes °Hemoglobin A1c 6.1 today, slightly worse than previous. We discussed lifestyle modifications to help prevent progression to diabetes.  ° °Annual Examination  °See AVS for age appropriate recommendations  °PHQ score 0, reviewed and discussed.  °BP reviewed and not at goal 151/83, goal <130/80.  °Asked about intimate partner violence and resources given as appropriate  °Advance directives discussion: paperwork given  ° °Considered the following items based   based upon USPSTF recommendations: Diabetes screening: ordered Screening for elevated cholesterol: ordered HIV testing:  previously ordered and negative Hepatitis C: ordered Hepatitis B:  would like at next visit Syphilis if at high risk:  would like at next visit  GC/CT  would like at next visit Osteoporosis screening considered based upon risk of fracture from [facility] calculator. Major osteoporotic fracture risk is 3.4%.  DEXA not ordered.  Reviewed risk factors for latent tuberculosis and not indicated  Discussed family history, BRCA testing not indicated. Tool used to risk stratify was Pedigree Assessment Tool.  Cervical cancer screening: prior Pap reviewed, repeat due in 03/2025 Breast cancer screening:  ordered Colorectal cancer screening: up to date on screening for CRC. Had colonoscopy Jan 2021 which was only significant for bleeding internal hemorrhoids.  Was recommended for repeat colonoscopy in 10 years (due Jan 2031) Lung cancer screening:  never smoked . See documentation below regarding indications/risks/benefits.  Vaccinations Influenza. Declines COVID. Shingrix sent to pharmacy.    Follow up in 1  year or sooner if indicated.   Sharion Settler, [place]

## 2021-07-13 NOTE — Patient Instructions (Addendum)
It was wonderful to see you today.  Please bring ALL of your medications with you to every visit.   Today we talked about:  We are doing lab work today to check for diabetes, your cholesterol, screening for Hepatitis C. I will send you a MyChart message if you have MyChart. Otherwise, I will give you a call for abnormal results or send a letter if everything returned back normal. If you don't hear from me in 2 weeks, please call the office.    I have sent a prescription for the Shingles vaccine to your pharmacy.  You are due for a mammogram. Call to schedule this.   Thank you for choosing [facility].   Please call [phone] with any questions about today's appointment.  Please be sure to schedule follow up at the front  desk before you leave today.   Sabino Dick, DO PGY-2 Family Medicine    Th?t tuy?t v?i khi g?p b?n ngy hm nay.  Vui lng mang theo T?T C? cc lo?i thu?c c?a b?n ??n m?i l?n khm.  Hm nay chng ta ? ni v?:  Hm nay chng ti ?ang lm vi?c trong phng th nghi?m ?? ki?m tra b?nh ti?u ???ng, cholesterol c?a b?n, sng l?c Vim gan C. Ti s? g?i cho b?n m?t tin nh?n MyChart n?u b?n c MyChart. N?u khng, ti s? g?i cho b?n n?u c k?t qu? b?t th??ng ho?c g?i th? n?u m?i th? tr? l?i bnh th??ng. N?u b?n khng nh?n ???c tin t?c t? ti trong 2 tu?n, xin vui lng g?i cho v?n phng.  Ti ? g?i ??n thu?c v?c-xin b?nh [place] ??n hi?u thu?c c?a b?n. B?n chu?n b? ch?p quang tuy?n v. G?i ?? ln l?ch ny.  C?m ?n b?n ? l?a ch?n Thu?c gia ?nh [place].  Vui lng g?i [phone] n?u c b?t k? cu h?i no v? cu?c h?n hm nay.  Vui lng ??m b?o s?p x?p theo di t?i qu?y l? tn tr??c khi b?n r?i ?i hm nay.  Sabino Dick, DO Y h?c gia ?nh PGY-2  Iliotibial Bursitis Rehab Ask your health care provider which exercises are safe for you. Do exercises exactly as told by your health care provider and adjust them as directed. It is normal to  feel mild stretching, pulling, tightness, or discomfort as you do these exercises. Stop right away if you feel sudden pain or your pain gets worse. Do not begin these exercises until told by your health care provider. Stretching and range-of-motion exercises These exercises warm up your muscles and joints and improve the movement and flexibility of your leg. These exercises also help to relieve pain and stiffness. Quadriceps stretch, prone  Lie on your abdomen (prone position) on a firm surface, such as a bed or padded floor. Bend your left / right knee and reach back to hold your ankle or pant leg. If you cannot reach your ankle or pant leg, loop a belt around your foot and grab the belt instead. Gently pull your heel toward your buttocks. Your knee should not slide out to the side. You should feel a stretch in the front of your thigh and knee (quadriceps). Hold this position for __________ seconds. Repeat __________ times. Complete this exercise __________ times a day. Lunge This exercise stretches the muscle in the inner thigh (adductor). Stand and spread your legs about 3 ft (1 m) apart. Put your left / right leg slightly back for balance. Lean away from your left /  right leg by bending your other knee and shifting your weight toward your bent knee. You may rest your hands on your thigh for balance. You should feel a stretch in your left / right inner thigh. Hold this position for __________ seconds. Repeat __________ times. Complete this exercise __________ times a day. Hamstring stretch, supine  Lie on your back (supine position). Loop a belt or towel over the ball of your left / right foot. The ball of your foot is on the walking surface, right under your toes. Straighten your left / right knee and slowly pull on the belt or towel to raise your leg. Stop when you feel a gentle stretch in the back of your left / right knee or thigh (hamstrings). Do not let your left / right knee  bend. Keep your other leg flat on the floor. Hold this position for __________ seconds. Repeat __________ times. Complete this exercise __________ times a day. Strengthening exercises These exercises build strength and endurance in your leg. Endurance is the ability to use your muscles for a long time, even after they get tired. Wall slides This exercise strengthens the muscles in the front of your thigh and knee (quadriceps). Lean your back against a smooth wall or door, and walk your feet out 18-24 inches (46-61 cm) from it. Place your feet hip-width apart. Slowly slide down the wall or door until your knees bend as far as told by your health care provider. Keep your knees over your heels, not your toes. Keep your knees in line with your hips. Hold this position for __________ seconds. Use the muscles in the front of your thigh to push yourself up to the standing position. Rest for __________ seconds after each repetition. Repeat __________ times. Complete this exercise __________ times a day. Straight leg raises, side-lying This exercise is sometimes called a hip abductor exercise. It strengthens the muscles that rotate the leg at the hip and move it away from your body (hip abductors). Lie on your side with your left / right leg in the top position. Lie so your head, shoulder, hip, and knee line up. Bend your bottom knee slightly to help you balance. Lift your top leg 4-6 inches (10-15 cm) while keeping your toes pointed straight ahead. Hold this position for __________ seconds. Slowly lower your leg to the starting position. Let your muscles relax completely after each repetition. Repeat __________ times. Complete this exercise __________ times a day. Straight leg raises, prone This exercise strengthens the muscles that move the hips (hip extensors). Lie on your abdomen (prone position) on a firm surface. You can put a pillow under your hips if that is more comfortable for your lower  back. Squeeze your buttocks muscles and lift your left / right leg about 4-6 inches (10-15 cm). Keep your knee straight as you lift your leg. Hold this position for __________ seconds. Slowly lower your leg to the starting position. Let your muscles relax completely after each repetition. Repeat __________ times. Complete this exercise __________ times a day. Bridge This exercise strengthens the muscles that move the hips (hip extensors). Lie on your back on a firm surface with your knees bent and your feet flat on the floor. Tighten your buttocks muscles and lift your bottom off the floor until the trunk of your body is level with your thighs. Do not arch your back. You should feel the muscles working in your buttocks and the back of your thighs. If you do not feel these  muscles, slide your feet 1-2 inches (2.5-5 cm) farther away from your buttocks. Hold this position for __________ seconds. Slowly lower your hips to the starting position. Let your muscles relax completely after each repetition. If this exercise is too easy, try doing it with your arms crossed over your chest. Repeat __________ times. Complete this exercise __________ times a day. This information is not intended to replace advice given to you by your health care provider. Make sure you discuss any questions you have with your health care provider. Document Revised: 10/25/2018 Document Reviewed: 09/10/2018 Elsevier Patient Education  2022 [employer].

## 2021-07-13 NOTE — Assessment & Plan Note (Signed)
Hemoglobin A1c 6.1 today, slightly worse than previous. We discussed lifestyle modifications to help prevent progression to diabetes.

## 2021-07-14 ENCOUNTER — Other Ambulatory Visit (HOSPITAL_COMMUNITY): Payer: Self-pay

## 2021-07-14 LAB — LIPID PANEL
Chol/HDL Ratio: 6.4 ratio — ABNORMAL HIGH (ref 0.0–4.4)
Cholesterol, Total: 205 mg/dL — ABNORMAL HIGH (ref 100–199)
HDL: 32 mg/dL — ABNORMAL LOW (ref >39)
LDL Chol Calc (NIH): 119 mg/dL — ABNORMAL HIGH (ref 0–99)
Triglycerides: 304 mg/dL — ABNORMAL HIGH (ref 0–149)
VLDL Cholesterol Cal: 54 mg/dL — ABNORMAL HIGH (ref 5–40)

## 2021-07-14 LAB — HCV INTERPRETATION

## 2021-07-14 LAB — HCV AB W REFLEX TO QUANT PCR: HCV Ab: 0.2 s/co ratio (ref 0.0–0.9)

## 2021-07-26 NOTE — Progress Notes (Addendum)
° ° °  SUBJECTIVE:   CHIEF COMPLAINT / HPI:   [place] interpreter Daih present for the entirety of the visit.   Blood Pressure F/U Lori Sullivan is a 51 y.o. female who presents to the clinic today for follow up on her blood pressure. She was last seen in the office on 12/27 for annual physical exam.  Blood pressure at that time was elevated 151/83.  She was given a refill on her amlodipine and advised to follow-up in 2 weeks for blood pressure recheck.  She returns today for blood pressure follow-up. Reports good compliance. Denies chest pain, shortness of breath, lower extremity edema, headaches, and vision changes.    Elevated Cholesterol Lipid panel obtained at previous visit notable for elevated cholesterol 205, elevated LDL 119, decreased HDL 32, increased triglycerides 304. ASCVD risk  4.7% in the next 10 years, no statin recommended at this time.  Back Pain  States that she is still having some back pain. Taking Naproxen without much relief. She does do stretches which seem to help. The pain goes down her thigh.   PERTINENT  PMH / PSH:  GERD, prediabetes, history of anemia  OBJECTIVE:   BP 135/70    Pulse 79    Ht 5' (1.524 m)    Wt 142 lb (64.4 kg)    SpO2 100%    BMI 27.73 kg/m    General: NAD, pleasant, able to participate in exam Cardiac: RRR Respiratory: Normal work of breathing GU: Normal appearance of labia majora and minora, without lesions. Vagina tissue pink, moist, without lesions or abrasions. Cervix normal appearance, non-friable, without discharge from os.  Extremities: no edema or cyanosis. Psych: Normal affect and mood  GU exam chaperoned by Clabe Seal CMA  ASSESSMENT/PLAN:   Essential hypertension BP improved today.  Tolerating amlodipine without any adverse effects. -Continue amlodipine 5 mg daily  Mixed hyperlipidemia Previously on atorvastatin 40 mg, patient had run out of this.  Lipid panel collected on 12/27 notable for elevated LDL, triglycerides,  total cholesterol, and decreased HDL.  ASCVD risk less than 5%, however shared decision making to continue on statin. -Refill atorvastatin 40 mg   Sciatica of left side Previous history of bilateral sciatica.  The back pain she is describing appears to be similar given radiation down left thigh.  She has been doing home exercises at home with some relief.  I offered PT, the patient states she does not have time to do this. -Continue home exercises -Can take Naproxen PRN   Routine screening for STI (sexually transmitted infection) STI screening performed today.  Patient is up-to-date on her Pap smear.  She was previously positive for trichomonas in 03/2020. -Follow-up labs and treat as indicated -Wet Prep returned negative; cleared Trich infection  Iron deficiency anemia Last CBC in 10/2019 with hemoglobin 12.5, MCV 71. Has been as low as 8.2 in 07/2019. Up-to-date on colon cancer screening (next due 07/2029).  -At next visit, inquire about menses -Consider repeat CBC, ferritin (given low MCV)  Sabino Dick, DO [facility]

## 2021-07-27 ENCOUNTER — Other Ambulatory Visit: Payer: Self-pay

## 2021-07-27 ENCOUNTER — Other Ambulatory Visit (HOSPITAL_COMMUNITY)
Admission: RE | Admit: 2021-07-27 | Discharge: 2021-07-27 | Disposition: A | Discharge status: Home / Self Care | Payer: 59 | Source: Ambulatory Visit | Attending: Family Medicine | Admitting: Family Medicine

## 2021-07-27 ENCOUNTER — Encounter: Payer: Self-pay | Admitting: Family Medicine

## 2021-07-27 ENCOUNTER — Other Ambulatory Visit (HOSPITAL_COMMUNITY): Payer: Self-pay

## 2021-07-27 ENCOUNTER — Ambulatory Visit (INDEPENDENT_AMBULATORY_CARE_PROVIDER_SITE_OTHER): Payer: 59 | Admitting: Family Medicine

## 2021-07-27 VITALS — BP 135/70 | HR 79 | Ht 60.0 in | Wt 142.0 lb

## 2021-07-27 DIAGNOSIS — Z113 Encounter for screening for infections with a predominantly sexual mode of transmission: Secondary | ICD-10-CM | POA: Insufficient documentation

## 2021-07-27 DIAGNOSIS — D509 Iron deficiency anemia, unspecified: Secondary | ICD-10-CM | POA: Insufficient documentation

## 2021-07-27 DIAGNOSIS — M5432 Sciatica, left side: Secondary | ICD-10-CM

## 2021-07-27 DIAGNOSIS — I10 Essential (primary) hypertension: Secondary | ICD-10-CM

## 2021-07-27 DIAGNOSIS — E782 Mixed hyperlipidemia: Secondary | ICD-10-CM

## 2021-07-27 LAB — POCT WET PREP (WET MOUNT)
Clue Cells Wet Prep Whiff POC: NEGATIVE
Trichomonas Wet Prep HPF POC: ABSENT

## 2021-07-27 MED ORDER — ATORVASTATIN CALCIUM 40 MG PO TABS
40.0000 mg | ORAL_TABLET | Freq: Every day | ORAL | 3 refills | Status: DC
  Filled 2021-07-27: qty 90, 90d supply, fill #0
  Filled 2021-11-10: qty 90, 90d supply, fill #1

## 2021-07-27 NOTE — Assessment & Plan Note (Signed)
Last CBC in 10/2019 with hemoglobin 12.5, MCV 71. Has been as low as 8.2 in 07/2019. Up-to-date on colon cancer screening (next due 07/2029).  -At next visit, inquire about menses -Consider repeat CBC, ferritin (given low MCV)

## 2021-07-27 NOTE — Assessment & Plan Note (Addendum)
Previously on atorvastatin 40 mg, patient had run out of this.  Lipid panel collected on 12/27 notable for elevated LDL, triglycerides, total cholesterol, and decreased HDL.  ASCVD risk less than 5%, however shared decision making to continue on statin. -Refill atorvastatin 40 mg

## 2021-07-27 NOTE — Assessment & Plan Note (Addendum)
STI screening performed today.  Patient is up-to-date on her Pap smear.  She was previously positive for trichomonas in 03/2020. -Follow-up labs and treat as indicated -Wet Prep returned negative; cleared Trich infection

## 2021-07-27 NOTE — Patient Instructions (Addendum)
It was wonderful to see you today.  Please bring ALL of your medications with you to every visit.   Today we talked about:  -I have refilled her cholesterol medication -We are doing test today to check for sexually transmitted infections.  This includes both a cervical swab and blood test. -Your blood pressure is improved today, continue take your medicines. -Continue to do stretches for your back, you can take the naproxen as needed.   Thank you for choosing [place].   Please call [phone] with any questions about today's appointment.  Please be sure to schedule follow up at the front  desk before you leave today.   Sharion Settler, DO PGY-2 Family Medicine

## 2021-07-27 NOTE — Assessment & Plan Note (Addendum)
Previous history of bilateral sciatica.  The back pain she is describing appears to be similar given radiation down left thigh.  She has been doing home exercises at home with some relief.  I offered PT, the patient states she does not have time to do this. -Continue home exercises -Can take Naproxen PRN

## 2021-07-27 NOTE — Assessment & Plan Note (Signed)
BP improved today.  Tolerating amlodipine without any adverse effects. -Continue amlodipine 5 mg daily

## 2021-07-28 ENCOUNTER — Other Ambulatory Visit (HOSPITAL_COMMUNITY): Payer: Self-pay

## 2021-07-28 LAB — HIV ANTIBODY (ROUTINE TESTING W REFLEX): HIV Screen 4th Generation wRfx: NONREACTIVE

## 2021-07-28 LAB — CERVICOVAGINAL ANCILLARY ONLY
Chlamydia: NEGATIVE
Comment: NEGATIVE
Comment: NORMAL
Neisseria Gonorrhea: NEGATIVE

## 2021-07-28 LAB — HEPATITIS B SURFACE ANTIGEN: Hepatitis B Surface Ag: NEGATIVE

## 2021-07-28 LAB — RPR: RPR Ser Ql: NONREACTIVE

## 2021-07-29 ENCOUNTER — Other Ambulatory Visit (HOSPITAL_COMMUNITY): Payer: Self-pay

## 2021-09-13 ENCOUNTER — Other Ambulatory Visit (HOSPITAL_COMMUNITY): Payer: Self-pay

## 2021-10-08 ENCOUNTER — Other Ambulatory Visit (HOSPITAL_COMMUNITY): Payer: Self-pay

## 2021-11-10 ENCOUNTER — Other Ambulatory Visit (HOSPITAL_COMMUNITY): Payer: Self-pay

## 2021-11-24 IMAGING — CT CT ABD-PELV W/ CM
2 of 5 series · 16 of 46 positions shown, 18 images · IV contrast (APPLIED)
Comparison: 03/29/2017

CLINICAL DATA: Abdominal pain and rectal bleeding

EXAM:
CT ABDOMEN AND PELVIS WITH CONTRAST
TECHNIQUE: Multidetector CT imaging of the abdomen and pelvis was performed
using the standard protocol following bolus administration of
intravenous contrast.
CONTRAST:  100mL OMNIPAQUE IOHEXOL 300 MG/ML  SOLN

[Series 3: abd/ pelvis 5.0 i30f 2 · axial · 0.71mm/px · z∈[+884,+1299]mm · 13 of 93 slices shown, 15 images]
[im 5/93  soft-tissue]
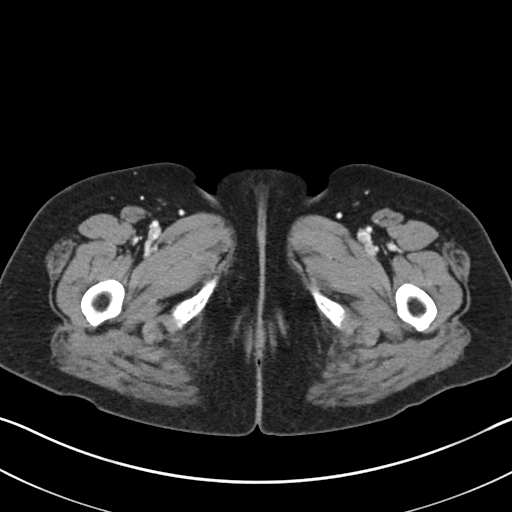
[im 5/93  bone]
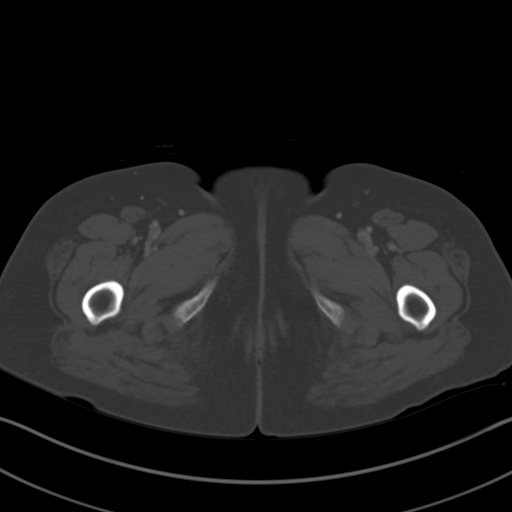
[im 15/93  soft-tissue]
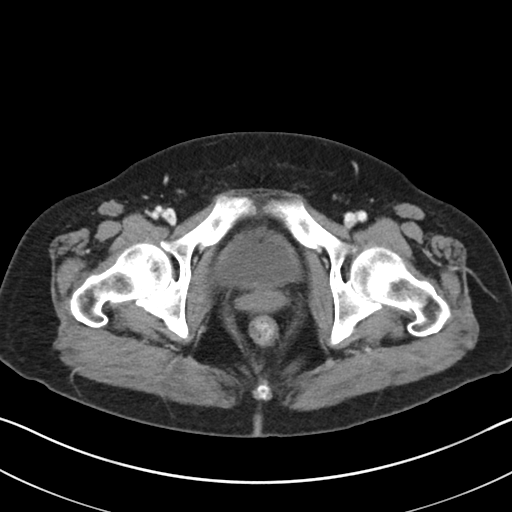
[im 20/93  soft-tissue]
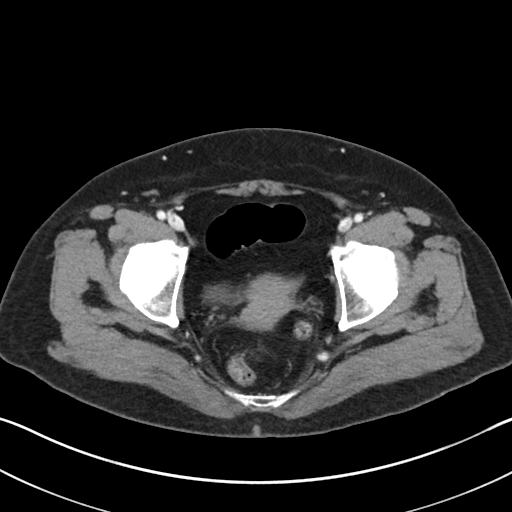
[im 25/93  soft-tissue]
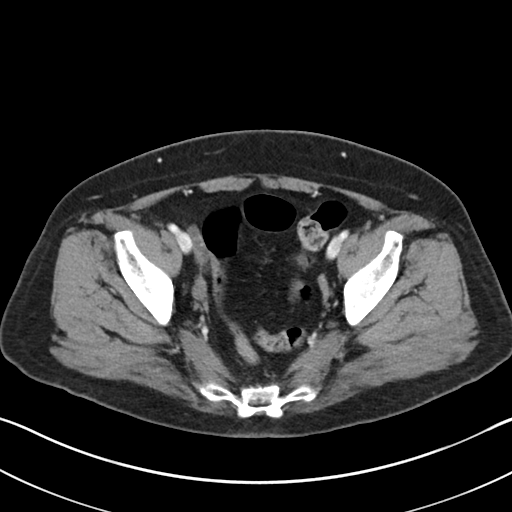
[im 34/93  soft-tissue]
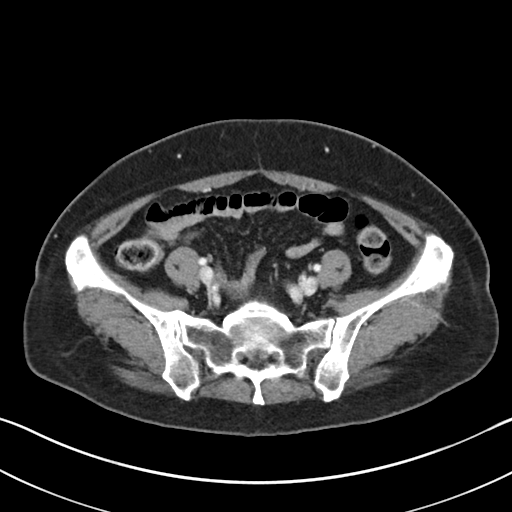
[im 39/93  soft-tissue]
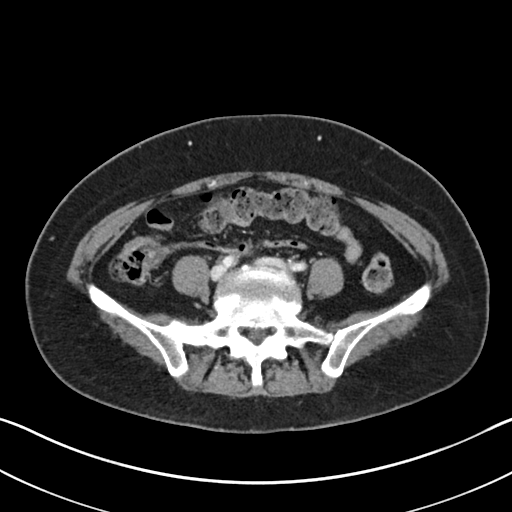
[im 49/93  soft-tissue]
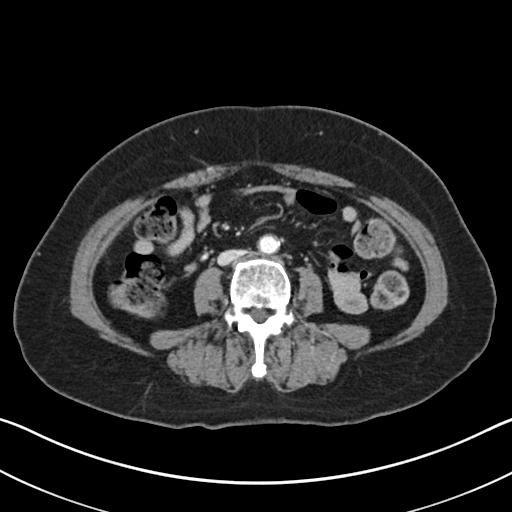
[im 54/93  soft-tissue]
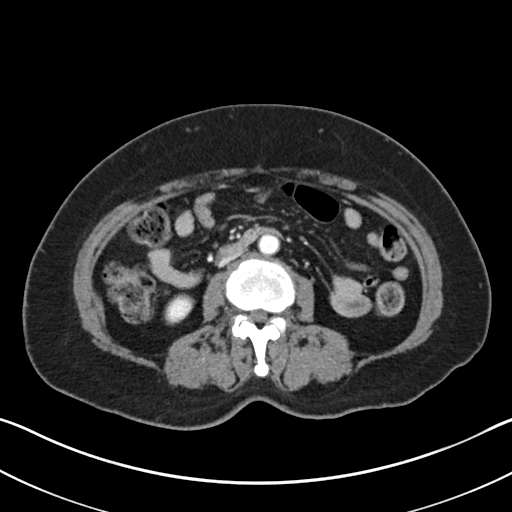
[im 59/93  soft-tissue]
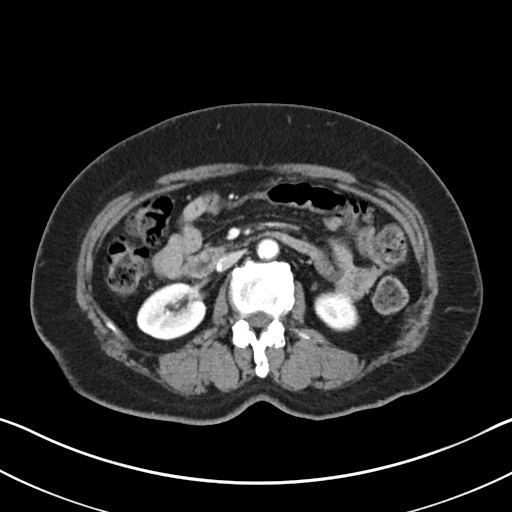
[im 59/93  bone]
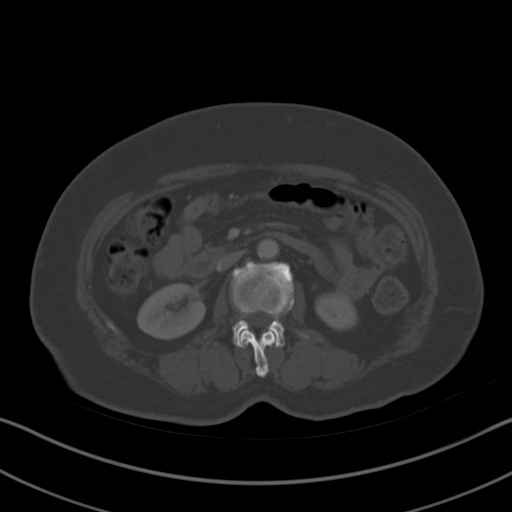
[im 68/93  soft-tissue]
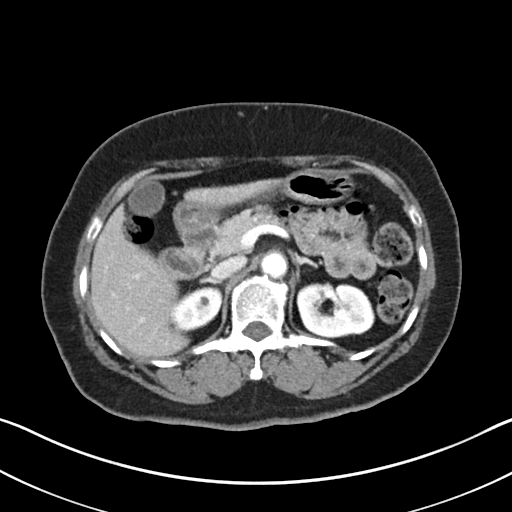
[im 73/93  soft-tissue]
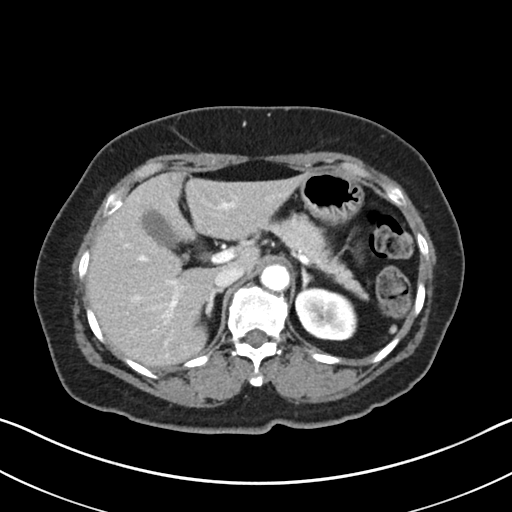
[im 78/93  soft-tissue]
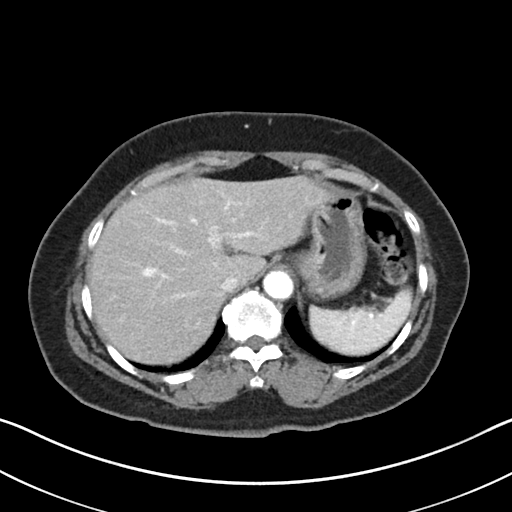
[im 88/93  soft-tissue]
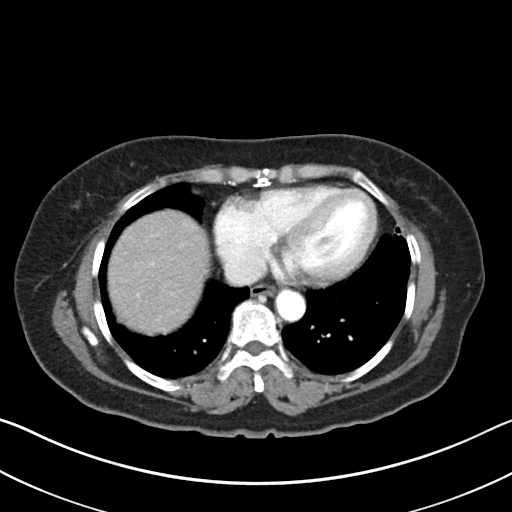

[Series 6: coronal soft tissue · coronal · 0.75mm/px · 3 of 89 slices shown]
[im 30/89  soft-tissue]
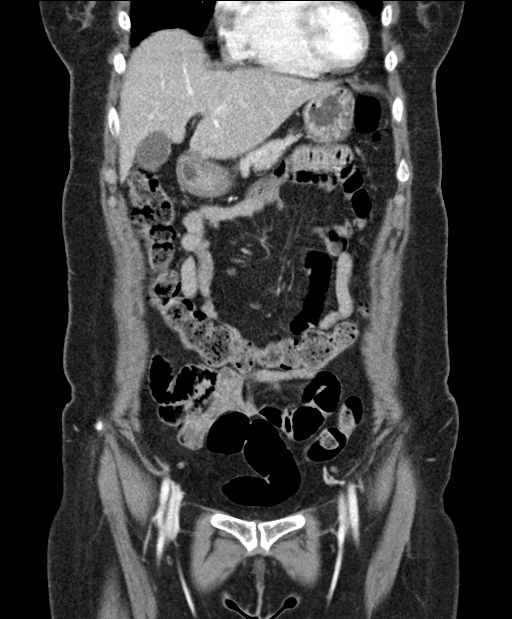
[im 40/89  soft-tissue]
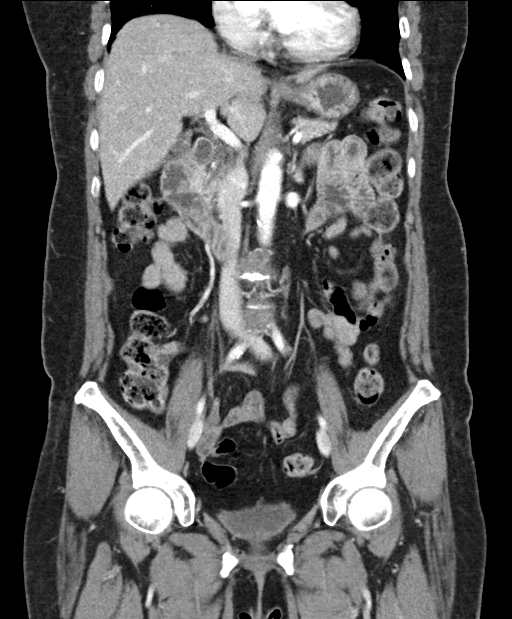
[im 49/89  soft-tissue]
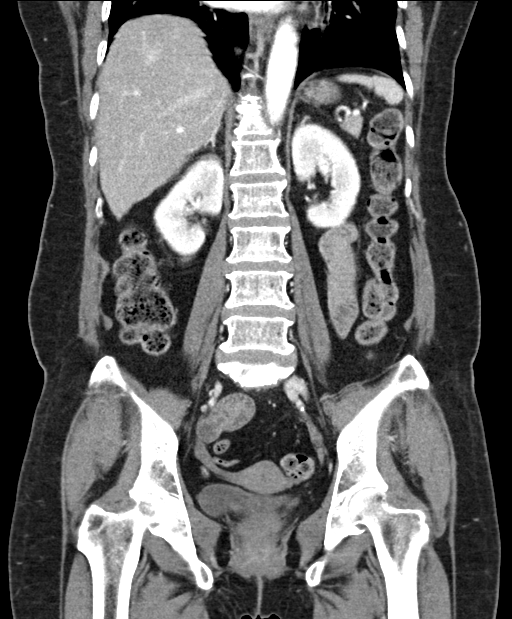

[16 of 46 positions shown; findings below may reference images not displayed]

FINDINGS: Lower chest: Lung bases demonstrate patchy peripheral ground-glass
opacities consistent with the given clinical history of AOU45-Q3
positivity.

Hepatobiliary: Fatty infiltration of the liver is noted. The
gallbladder is within normal limits.

Pancreas: Unremarkable. No pancreatic ductal dilatation or
surrounding inflammatory changes.

Spleen: Normal in size without focal abnormality.

Adrenals/Urinary Tract: Adrenal glands are within normal limits.
Kidneys demonstrate a normal enhancement pattern bilaterally. No
renal calculi or obstructive changes are noted. The bladder is
decompressed.

Stomach/Bowel: The appendix is within normal limits. The colon shows
no obstructive or inflammatory changes. No small bowel abnormality
is noted. Stomach is within normal limits.

Vascular/Lymphatic: Aortic atherosclerosis. No enlarged abdominal or
pelvic lymph nodes.

Reproductive: Uterus and bilateral adnexa are unremarkable.

Other: No abdominal wall hernia or abnormality. No abdominopelvic
ascites.

Musculoskeletal: Degenerative changes of lumbar spine are noted.
IMPRESSION: Changes in the lung bases particularly on the right consistent with
the given clinical history of AOU45-Q3 positivity.

No other acute abnormality noted.

## 2022-02-01 ENCOUNTER — Other Ambulatory Visit (HOSPITAL_COMMUNITY): Payer: Self-pay

## 2022-02-01 ENCOUNTER — Other Ambulatory Visit: Payer: Self-pay | Admitting: Family Medicine

## 2022-02-01 DIAGNOSIS — R1013 Epigastric pain: Secondary | ICD-10-CM

## 2022-02-02 ENCOUNTER — Other Ambulatory Visit (HOSPITAL_COMMUNITY): Payer: Self-pay

## 2022-02-02 MED ORDER — OMEPRAZOLE 20 MG PO CPDR
20.0000 mg | DELAYED_RELEASE_CAPSULE | Freq: Every day | ORAL | 0 refills | Status: DC
  Filled 2022-02-02: qty 60, 60d supply, fill #0

## 2022-02-03 ENCOUNTER — Other Ambulatory Visit (HOSPITAL_COMMUNITY): Payer: Self-pay

## 2022-03-28 ENCOUNTER — Other Ambulatory Visit (HOSPITAL_COMMUNITY): Payer: Self-pay

## 2022-03-28 NOTE — Progress Notes (Signed)
    SUBJECTIVE:   CHIEF COMPLAINT / HPI:   Urinary problem- no itching, no dysuria. Something coming up when she tries to urinate. Since February. No vaginal pain. G1P1. No gynecologic surgery. Only when trying to urinate does she have this sensation. No blood in urine. No vaginal bleeding, no menses x 5 years.  PERTINENT  PMH / PSH: anemia, GERD, prediabetes, HLD, HTN  OBJECTIVE:   BP (!) 125/55   Pulse 82   Ht 5' (1.524 m)   Wt 144 lb 8 oz (65.5 kg)   SpO2 100%   BMI 28.22 kg/m   General: A&O, NAD HEENT: No sign of trauma, EOM grossly intact Cardiac: RRR, no m/r/g Respiratory: CTAB, normal WOB, no w/c/r GI: Soft, NTTP, non-distended  Extremities: NTTP, no peripheral edema. Neuro: Normal gait, moves all four extremities appropriately. Psych: Appropriate mood and affect  GU: Normal appearance of labia majora and minora, without lesions. Vagina tissue pink, moist, without lesions or abrasions. Cervix normal appearance, with prolapse noted with Valsalva maneuver. NO lesions or lacerations appreciated.   ASSESSMENT/PLAN:   Cervical prolapse Noted on exam, explained physiology to patient referralto OB gyn for consideration of pessary     Billey Co, MD [facility]

## 2022-03-29 ENCOUNTER — Other Ambulatory Visit (HOSPITAL_COMMUNITY): Payer: Self-pay

## 2022-04-01 ENCOUNTER — Ambulatory Visit (INDEPENDENT_AMBULATORY_CARE_PROVIDER_SITE_OTHER): Payer: Commercial Managed Care - HMO | Admitting: Family Medicine

## 2022-04-01 ENCOUNTER — Encounter: Payer: Self-pay | Admitting: Family Medicine

## 2022-04-01 VITALS — BP 125/55 | HR 82 | Ht 60.0 in | Wt 144.5 lb

## 2022-04-01 DIAGNOSIS — N812 Incomplete uterovaginal prolapse: Secondary | ICD-10-CM | POA: Diagnosis not present

## 2022-04-01 NOTE — Assessment & Plan Note (Addendum)
Noted on exam, explained physiology to patient referralto OB gyn for consideration of pessary

## 2022-04-01 NOTE — Patient Instructions (Signed)
It was wonderful to see you today.  Please bring ALL of your medications with you to every visit.   Today we talked about:  You have cervical prolapse. I have referred you to OBGYN.   Thank you for choosing [facility].   Please call [phone] with any questions about today's appointment.  Please be sure to schedule follow up at the front  desk before you leave today.   Please arrive at least 15 minutes prior to your scheduled appointments.   If you had blood work today, I will send you a MyChart message or a letter if results are normal. Otherwise, I will give you a call.   If you had a referral placed, they will call you to set up an appointment. Please give [place] a call if you don't hear back in the next 2 weeks.   If you need additional refills before your next appointment, please call your pharmacy first.   Burley Saver, MD  Family Medicine

## 2022-04-08 NOTE — Progress Notes (Unsigned)
    SUBJECTIVE:   CHIEF COMPLAINT / HPI:   Presents for wellness exam today.  HTN- missed medications, has not taken since Friday. Tolerating well.  HLD- out for 2 days, needs refill GERD- needs refill on medications  Prediabetes- repeat A1c 6.4 today. Asymptomatic.  HCM Open to getting mammogram Would like flu shot  Social Hx Denies ever smoking Denies alcohol or other illicit drug use  PERTINENT  PMH / PSH: cervical prolapse, prediabetes, HTN, HLD, GERD  OBJECTIVE:   BP (!) 142/76   Pulse 77   Ht 5' (1.524 m)   Wt 145 lb 12.8 oz (66.1 kg)   SpO2 99%   BMI 28.47 kg/m   General: A&O, NAD HEENT: No sign of trauma, EOM grossly intact Cardiac: RRR, no m/r/g Respiratory: CTAB, normal WOB, no w/c/r GI: Soft, NTTP, non-distended  Extremities: NTTP, no peripheral edema. Neuro: Normal gait, moves all four extremities appropriately. Psych: Appropriate mood and affect    ASSESSMENT/PLAN:   Encounter for screening mammogram for breast cancer Mammogram ordered and scheduled  Essential hypertension Slightly high on repeat but out for 2 days, continue home medications  Prediabetes A1c 6.4 today, discussed diet and exercise interventions  GERD Medications refilled  Mixed hyperlipidemia Refilled statin check lipid panel today   Flu shot given today.  Lenoria Chime, MD [place]

## 2022-04-11 ENCOUNTER — Encounter: Payer: Self-pay | Admitting: Family Medicine

## 2022-04-11 ENCOUNTER — Ambulatory Visit (INDEPENDENT_AMBULATORY_CARE_PROVIDER_SITE_OTHER): Payer: Commercial Managed Care - HMO | Admitting: Family Medicine

## 2022-04-11 ENCOUNTER — Other Ambulatory Visit (HOSPITAL_COMMUNITY): Payer: Self-pay

## 2022-04-11 VITALS — BP 142/76 | HR 77 | Ht 60.0 in | Wt 145.8 lb

## 2022-04-11 DIAGNOSIS — Z23 Encounter for immunization: Secondary | ICD-10-CM

## 2022-04-11 DIAGNOSIS — K219 Gastro-esophageal reflux disease without esophagitis: Secondary | ICD-10-CM | POA: Diagnosis not present

## 2022-04-11 DIAGNOSIS — R7303 Prediabetes: Secondary | ICD-10-CM

## 2022-04-11 DIAGNOSIS — E782 Mixed hyperlipidemia: Secondary | ICD-10-CM | POA: Diagnosis not present

## 2022-04-11 DIAGNOSIS — Z1231 Encounter for screening mammogram for malignant neoplasm of breast: Secondary | ICD-10-CM | POA: Insufficient documentation

## 2022-04-11 DIAGNOSIS — R1013 Epigastric pain: Secondary | ICD-10-CM

## 2022-04-11 DIAGNOSIS — I10 Essential (primary) hypertension: Secondary | ICD-10-CM

## 2022-04-11 LAB — POCT GLYCOSYLATED HEMOGLOBIN (HGB A1C): HbA1c, POC (prediabetic range): 6.4 % (ref 5.7–6.4)

## 2022-04-11 MED ORDER — OMEPRAZOLE 20 MG PO CPDR
20.0000 mg | DELAYED_RELEASE_CAPSULE | Freq: Every day | ORAL | 0 refills | Status: DC
  Filled 2022-04-11: qty 60, 60d supply, fill #0

## 2022-04-11 MED ORDER — FAMOTIDINE 20 MG PO TABS
20.0000 mg | ORAL_TABLET | Freq: Every day | ORAL | 3 refills | Status: DC | PRN
  Filled 2022-04-11: qty 90, 90d supply, fill #0
  Filled 2022-07-25: qty 30, 30d supply, fill #1
  Filled 2022-07-25: qty 90, 90d supply, fill #1

## 2022-04-11 MED ORDER — ATORVASTATIN CALCIUM 40 MG PO TABS
40.0000 mg | ORAL_TABLET | Freq: Every day | ORAL | 3 refills | Status: DC
  Filled 2022-04-11: qty 90, 90d supply, fill #0
  Filled 2022-07-25 (×2): qty 30, 30d supply, fill #1
  Filled 2022-09-14: qty 30, 30d supply, fill #2
  Filled 2022-11-21: qty 30, 30d supply, fill #3
  Filled 2023-01-03: qty 30, 30d supply, fill #4

## 2022-04-11 NOTE — Assessment & Plan Note (Signed)
Slightly high on repeat but out for 2 days, continue home medications

## 2022-04-11 NOTE — Assessment & Plan Note (Signed)
>>  ASSESSMENT AND PLAN FOR PREDIABETES WRITTEN ON 04/11/2022  5:15 PM BY AY, MARGARET E, MD  A1c 6.4 today, discussed diet and exercise interventions

## 2022-04-11 NOTE — Patient Instructions (Addendum)
It was wonderful to see you today.  Please bring ALL of your medications with you to every visit.   Today we talked about:  For your prediabetes, your A1c was 6.4. Continue to work on diet and exercise.  We will check labs today and I wil lcall you if anything is abnormal.  I have refilled your medications. We gave you your flu shot today!  We scheduled you for a mammogram October 25 at 7:50 AM   Thank you for choosing [place].   Please call [phone] with any questions about today's appointment.  Please be sure to schedule follow up at the front  desk before you leave today.   Please arrive at least 15 minutes prior to your scheduled appointments.   If you had blood work today, I will send you a MyChart message or a letter if results are normal. Otherwise, I will give you a call.   If you had a referral placed, they will call you to set up an appointment. Please give [place] a call if you don't hear back in the next 2 weeks.   If you need additional refills before your next appointment, please call your pharmacy first.   Yehuda Savannah, MD  Family Medicine

## 2022-04-11 NOTE — Assessment & Plan Note (Signed)
Mammogram ordered and scheduled  

## 2022-04-11 NOTE — Assessment & Plan Note (Signed)
A1c 6.4 today, discussed diet and exercise interventions

## 2022-04-11 NOTE — Assessment & Plan Note (Signed)
Medications refilled

## 2022-04-11 NOTE — Assessment & Plan Note (Signed)
Refilled statin check lipid panel today

## 2022-04-12 LAB — BASIC METABOLIC PANEL
BUN/Creatinine Ratio: 18 (ref 9–23)
BUN: 14 mg/dL (ref 6–24)
CO2: 24 mmol/L (ref 20–29)
Calcium: 9.9 mg/dL (ref 8.7–10.2)
Chloride: 101 mmol/L (ref 96–106)
Creatinine, Ser: 0.8 mg/dL (ref 0.57–1.00)
Glucose: 110 mg/dL — ABNORMAL HIGH (ref 70–99)
Potassium: 4.7 mmol/L (ref 3.5–5.2)
Sodium: 141 mmol/L (ref 134–144)
eGFR: 89 mL/min/{1.73_m2} (ref >59)

## 2022-04-12 LAB — LIPID PANEL
Chol/HDL Ratio: 5.5 ratio — ABNORMAL HIGH (ref 0.0–4.4)
Cholesterol, Total: 186 mg/dL (ref 100–199)
HDL: 34 mg/dL — ABNORMAL LOW (ref >39)
LDL Chol Calc (NIH): 108 mg/dL — ABNORMAL HIGH (ref 0–99)
Triglycerides: 253 mg/dL — ABNORMAL HIGH (ref 0–149)
VLDL Cholesterol Cal: 44 mg/dL — ABNORMAL HIGH (ref 5–40)

## 2022-05-11 ENCOUNTER — Ambulatory Visit
Admission: RE | Admit: 2022-05-11 | Discharge: 2022-05-11 | Disposition: A | Discharge status: Home / Self Care | Payer: Commercial Managed Care - HMO | Source: Ambulatory Visit | Attending: Family Medicine | Admitting: Family Medicine

## 2022-05-11 DIAGNOSIS — Z1231 Encounter for screening mammogram for malignant neoplasm of breast: Secondary | ICD-10-CM

## 2022-07-25 ENCOUNTER — Other Ambulatory Visit (HOSPITAL_COMMUNITY): Payer: Self-pay

## 2022-07-25 ENCOUNTER — Other Ambulatory Visit: Payer: Self-pay

## 2022-07-25 ENCOUNTER — Other Ambulatory Visit: Payer: Self-pay | Admitting: Family Medicine

## 2022-07-25 DIAGNOSIS — R1013 Epigastric pain: Secondary | ICD-10-CM

## 2022-07-25 MED ORDER — AMLODIPINE BESYLATE 5 MG PO TABS
5.0000 mg | ORAL_TABLET | Freq: Every day | ORAL | 3 refills | Status: DC
  Filled 2022-07-25: qty 30, 30d supply, fill #0
  Filled 2022-07-25: qty 90, 90d supply, fill #0
  Filled 2022-09-14: qty 30, 30d supply, fill #1
  Filled 2022-11-21: qty 30, 30d supply, fill #2
  Filled 2023-01-03: qty 30, 30d supply, fill #3
  Filled 2023-02-24: qty 30, 30d supply, fill #4

## 2022-07-25 MED ORDER — OMEPRAZOLE 20 MG PO CPDR
20.0000 mg | DELAYED_RELEASE_CAPSULE | Freq: Every day | ORAL | 0 refills | Status: DC
  Filled 2022-07-25: qty 30, 30d supply, fill #0
  Filled 2022-07-25: qty 60, 60d supply, fill #0
  Filled 2022-11-21: qty 30, 30d supply, fill #1

## 2022-08-02 ENCOUNTER — Other Ambulatory Visit (HOSPITAL_COMMUNITY): Payer: Self-pay

## 2022-08-11 ENCOUNTER — Ambulatory Visit (INDEPENDENT_AMBULATORY_CARE_PROVIDER_SITE_OTHER): Payer: 59 | Admitting: Student

## 2022-08-11 ENCOUNTER — Other Ambulatory Visit (HOSPITAL_COMMUNITY)
Admission: RE | Admit: 2022-08-11 | Discharge: 2022-08-11 | Disposition: A | Discharge status: Home / Self Care | Payer: 59 | Source: Ambulatory Visit | Attending: Student | Admitting: Student

## 2022-08-11 ENCOUNTER — Encounter: Payer: Self-pay | Admitting: Student

## 2022-08-11 VITALS — BP 141/84 | HR 77 | Ht 60.0 in | Wt 145.2 lb

## 2022-08-11 DIAGNOSIS — R32 Unspecified urinary incontinence: Secondary | ICD-10-CM

## 2022-08-11 DIAGNOSIS — N8110 Cystocele, unspecified: Secondary | ICD-10-CM

## 2022-08-11 DIAGNOSIS — N949 Unspecified condition associated with female genital organs and menstrual cycle: Secondary | ICD-10-CM

## 2022-08-11 DIAGNOSIS — Z789 Other specified health status: Secondary | ICD-10-CM

## 2022-08-11 DIAGNOSIS — Z7689 Persons encountering health services in other specified circumstances: Secondary | ICD-10-CM

## 2022-08-11 DIAGNOSIS — Z01419 Encounter for gynecological examination (general) (routine) without abnormal findings: Secondary | ICD-10-CM

## 2022-08-11 DIAGNOSIS — S3140XA Unspecified open wound of vagina and vulva, initial encounter: Secondary | ICD-10-CM

## 2022-08-11 NOTE — Progress Notes (Signed)
ANNUAL EXAM Patient name: Lori Sullivan MRN [id number]  Date of birth: Oct 09, 1970 Chief Complaint:   Gynecologic Exam  History of Present Illness:   Lori Sullivan is a 52 y.o. G7P0000  Asian  female being seen today for a routine annual exam.  Current complaints: Vaginal Bulge.   Patient shares that every morning she notices a bulge at her introitus. She normally pushes it  "up and back inside" whenever she feels it. Also, has noticed increased urinary leakage. Denies any pain, dysuria, constipation, or discharge.  No LMP recorded. Patient is postmenopausal.   The pregnancy intention screening data noted above was reviewed. Potential methods of contraception were discussed. The patient elected to proceed with No data recorded.   Last pap 03/2020. Results were: NILM w/ HRHPV negative. H/O abnormal pap: no Last mammogram: 04/2022. Results were: normal.   Last colonoscopy: 2021. Results were: normal.      08/11/2022   10:44 AM 04/11/2022    8:46 AM 04/01/2022   10:21 AM 07/27/2021    9:12 AM 07/13/2021    4:06 PM  Depression screen PHQ 2/9  Decreased Interest 0 0 0 0 0  Down, Depressed, Hopeless 0 0 0 0 0  PHQ - 2 Score 0 0 0 0 0  Altered sleeping  0 0 0 0  Tired, decreased energy  0 0 0 0  Change in appetite  0 0 0 0  Feeling bad or failure about yourself   0 0 0 0  Trouble concentrating  0 0 0 0  Moving slowly or fidgety/restless  0 0 0 0  Suicidal thoughts  0 0 0 0  PHQ-9 Score  0 0 0 0  Difficult doing work/chores    Not difficult at all          No data to display           Review of Systems:   Pertinent items are noted in HPI Denies any headaches, blurred vision, fatigue, shortness of breath, chest pain, abdominal pain, abnormal vaginal discharge/itching/odor/irritation, problems with periods, bowel movements, urination, or intercourse unless otherwise stated above. Pertinent History Reviewed:  Reviewed past medical,surgical, social and family history.  Reviewed  problem list, medications and allergies. Physical Assessment:   Vitals:   08/11/22 0955 08/11/22 1043  BP: (!) 150/77 (!) 141/84  Pulse: 78 77  Weight: 145 lb 3.2 oz (65.9 kg)   Height: 5' (1.524 m)   Body mass index is 28.36 kg/m.        Physical Examination:   General appearance - well appearing, and in no distress  Mental status - alert, oriented to person, place, and time  Psych:  She has a normal mood and affect  Skin - warm and dry, normal color, no suspicious lesions noted  Chest - effort normal, all lung fields clear to auscultation bilaterally  Heart - normal rate and regular rhythm  Neck:  midline trachea, no thyromegaly or nodules  Breasts - breasts appear normal  Abdomen - soft, nontender, nondistended, no masses or organomegaly  Pelvic - VULVA: normal appearing vulva with no masses, tenderness or lesions   VAGINA: normal appearing vagina with normal color and discharge, no lesions   CERVIX: normal appearing cervix without discharge or lesions  Thin prep pap is done w/ HR HPV cotesting  Rectal - normal rectal, good sphincter tone, no masses felt   Extremities:  No swelling or varicosities noted  Chaperone present for exam  No results  found for this or any previous visit (from the past 24 hour(s)).  Assessment & Plan:  1. Women's annual routine gynecological examination - Cytology - PAP - Cervicovaginal ancillary only  2. Vaginal discomfort 3. Vaginal wall prolapse 4. Urinary incontinence, unspecified type -  Reducible vaginal prolapse, skin intact -  Ambulatory referral to Urogynecology  5. Language barrier affecting health care - Interpreter at the bedside  6. Encounter to establish care   Labs/procedures today: Pap  Mammogram:  schedule after October 2024 , or sooner if problems Colonoscopy:  Due 2031 , or sooner if problems  Orders Placed This Encounter  Procedures   Ambulatory referral to Urogynecology    Meds: No orders of the defined types  were placed in this encounter.   Follow-up: Return in about 1 year (around 08/12/2023), or if symptoms worsen or fail to improve, for ANN.  Johnston Ebbs, NP 08/12/2022 10:02 PM

## 2022-08-11 NOTE — Progress Notes (Signed)
New pt presents for annual exam. Last mammogram 05/11/22. Last PAP 03/30/20. Pt has not had a colonoscopy. Denies any abnormal breast changes. Denies any abnormal vaginal discharge, odor, pain. Pt reports having possible uterine or bladder prolapse. No other concerns at this time.

## 2022-08-12 LAB — CERVICOVAGINAL ANCILLARY ONLY
Chlamydia: NEGATIVE
Comment: NEGATIVE
Comment: NEGATIVE
Comment: NORMAL
Neisseria Gonorrhea: NEGATIVE
Trichomonas: NEGATIVE

## 2022-08-16 LAB — CYTOLOGY - PAP: Diagnosis: NEGATIVE

## 2022-09-14 ENCOUNTER — Other Ambulatory Visit (HOSPITAL_COMMUNITY): Payer: Self-pay

## 2022-09-20 ENCOUNTER — Other Ambulatory Visit (HOSPITAL_COMMUNITY): Payer: Self-pay

## 2022-10-12 ENCOUNTER — Other Ambulatory Visit (HOSPITAL_COMMUNITY): Payer: Self-pay

## 2022-11-22 ENCOUNTER — Other Ambulatory Visit (HOSPITAL_COMMUNITY): Payer: Self-pay

## 2023-01-03 ENCOUNTER — Other Ambulatory Visit: Payer: Self-pay | Admitting: Student

## 2023-01-03 ENCOUNTER — Other Ambulatory Visit (HOSPITAL_COMMUNITY): Payer: Self-pay

## 2023-01-03 DIAGNOSIS — R1013 Epigastric pain: Secondary | ICD-10-CM

## 2023-01-03 MED ORDER — OMEPRAZOLE 20 MG PO CPDR
20.0000 mg | DELAYED_RELEASE_CAPSULE | Freq: Every day | ORAL | 0 refills | Status: DC
  Filled 2023-01-03: qty 60, 60d supply, fill #0

## 2023-01-04 ENCOUNTER — Other Ambulatory Visit (HOSPITAL_COMMUNITY): Payer: Self-pay

## 2023-01-05 ENCOUNTER — Other Ambulatory Visit (HOSPITAL_COMMUNITY): Payer: Self-pay

## 2023-01-31 ENCOUNTER — Ambulatory Visit (INDEPENDENT_AMBULATORY_CARE_PROVIDER_SITE_OTHER): Payer: 59 | Admitting: Family Medicine

## 2023-01-31 ENCOUNTER — Other Ambulatory Visit (HOSPITAL_COMMUNITY): Payer: Self-pay

## 2023-01-31 ENCOUNTER — Encounter: Payer: Self-pay | Admitting: Family Medicine

## 2023-01-31 VITALS — BP 120/80 | HR 87 | Ht 60.0 in | Wt 143.0 lb

## 2023-01-31 DIAGNOSIS — B001 Herpesviral vesicular dermatitis: Secondary | ICD-10-CM

## 2023-01-31 DIAGNOSIS — E782 Mixed hyperlipidemia: Secondary | ICD-10-CM

## 2023-01-31 DIAGNOSIS — E119 Type 2 diabetes mellitus without complications: Secondary | ICD-10-CM | POA: Diagnosis not present

## 2023-01-31 DIAGNOSIS — N8110 Cystocele, unspecified: Secondary | ICD-10-CM

## 2023-01-31 DIAGNOSIS — R7303 Prediabetes: Secondary | ICD-10-CM

## 2023-01-31 DIAGNOSIS — K219 Gastro-esophageal reflux disease without esophagitis: Secondary | ICD-10-CM

## 2023-01-31 DIAGNOSIS — N816 Rectocele: Secondary | ICD-10-CM | POA: Insufficient documentation

## 2023-01-31 DIAGNOSIS — I10 Essential (primary) hypertension: Secondary | ICD-10-CM | POA: Diagnosis not present

## 2023-01-31 LAB — POCT GLYCOSYLATED HEMOGLOBIN (HGB A1C): HbA1c, POC (controlled diabetic range): 7.1 % — AB (ref 0.0–7.0)

## 2023-01-31 MED ORDER — OMEPRAZOLE 40 MG PO CPDR
40.0000 mg | DELAYED_RELEASE_CAPSULE | Freq: Every day | ORAL | 3 refills | Status: DC
  Filled 2023-01-31: qty 30, 30d supply, fill #0

## 2023-01-31 NOTE — Assessment & Plan Note (Signed)
Anterior bulging of the bladder into the vaginal introitus observed today on exam.  She already has a referral to urogynecology per her GYN office.  We will defer this problem to them in the future

## 2023-01-31 NOTE — Assessment & Plan Note (Signed)
A1c at today's visit was 7.1 which is an increase into the diabetic range from previous value of 6.4.  This patient to schedule follow-up with her PCP, Dr. Bess Kinds, to discuss this as well as go over the labs we drew today for her ongoing chronic conditions

## 2023-01-31 NOTE — Assessment & Plan Note (Signed)
Patient is having breakthrough symptoms during times of stress.  Increased her dose of omeprazole from 20mg  to 40mg  daily.  Instructed her to discuss this with her primary care doctor at follow-up in 2 weeks.

## 2023-01-31 NOTE — Assessment & Plan Note (Signed)
This is the patient's first instance of cold sore. Since it is already healed we discussed care if she should have another one appear.

## 2023-01-31 NOTE — Patient Instructions (Signed)
It was wonderful to see you today.  Today we talked about:  Your vaginal prolapse and cold sore.  As we discussed you have already been referred to urogynecology, they are not accepting new patients until August so once they are able to get you in and they will give you a call with your appointment date.  Cold sore is mostly healed, we discussed what you should do if it comes back including using Abreva which she can get over-the-counter or calling in to get a pill called Valtrex.  Also did some routine labs today including an A1c and CMP and a lipid panel to check on some of your chronic conditions.  You should follow-up with your primary care doctor in the next 2 weeks to discuss the results of these labs.  Thank you for choosing [facility].   Please call [phone] with any questions about today's appointment.  Please arrive at least 15 minutes prior to your scheduled appointments.   If you had blood work today, I will send you a MyChart message or a letter if results are normal. Otherwise, I will give you a call.   If you had a referral placed, they will call you to set up an appointment. Please give [place] a call if you don't hear back in the next 2 weeks.   If you need additional refills before your next appointment, please call your pharmacy first.   Gerrit Heck, DO Family Medicine

## 2023-01-31 NOTE — Progress Notes (Signed)
    SUBJECTIVE:   CHIEF COMPLAINT / HPI:   Like something is falling out through her vagina.  Last visit in September patient was seen to have cervical prolapse, was referred to GYN who saw her in January.  GYN was not able to reproduce the symptoms but did feel that a referral to urogyn was appropriate.  [facility] urogyn office has temporary hold on accepting new patients.  No real change in symptomatology from previous visits other than mixed picture urinary incontinence that she reported to the GYN.  Also has a cold sore on her lip.  Appeared 2 weeks ago, has been painful and sore.  She has never had anything like this before.  Denies any spread from that area.  Denies fevers chills or other concerning systemic signs.  Also reports central epigastric pain during times of stress and morning.  At times the pain migrates up her throat.  She has a history of GERD.  Denies any other times where she feels this pain.  No exacerbating or alleviating factors.  Denies nausea vomiting  Prediabetes-last A1c was 6.4 will recheck today   PERTINENT  PMH / PSH: Hypertension, hyperlipidemia, GERD, prediabetes  OBJECTIVE:   BP 120/80   Pulse 87   Ht 5' (1.524 m)   Wt 143 lb (64.9 kg)   SpO2 98%   BMI 27.93 kg/m    GU: Jone Baseman present as chaperone.  Normal vulvar and external mucosa.  Normal appearance of vaginal mucosa with 1+ anterior prolapse.  Normal-appearing cervix.  No evidence of posterior prolapse.  No urine leakage with stress.  ASSESSMENT/PLAN:   Type 2 diabetes mellitus without complication, without long-term current use of insulin (HCC) A1c at today's visit was 7.1 which is an increase into the diabetic range from previous value of 6.4.  This patient to schedule follow-up with her PCP, Dr. Bess Kinds, to discuss this as well as go over the labs we drew today for her ongoing chronic conditions  Vaginal prolapse Anterior bulging of the bladder into the vaginal introitus observed  today on exam.  She already has a referral to urogynecology per her GYN office.  We will defer this problem to them in the future  Cold sore This is the patient's first instance of cold sore. Since it is already healed we discussed care if she should have another one appear.  GERD Patient is having breakthrough symptoms during times of stress.  Increased her dose of omeprazole from 20mg  to 40mg  daily.  Instructed her to discuss this with her primary care doctor at follow-up in 2 weeks.    Gerrit Heck, DO [facility]

## 2023-02-01 LAB — COMPREHENSIVE METABOLIC PANEL
ALT: 17 IU/L (ref 0–32)
AST: 18 IU/L (ref 0–40)
Albumin: 4.5 g/dL (ref 3.8–4.9)
Alkaline Phosphatase: 111 IU/L (ref 44–121)
BUN/Creatinine Ratio: 7 — ABNORMAL LOW (ref 9–23)
BUN: 6 mg/dL (ref 6–24)
Bilirubin Total: 0.3 mg/dL (ref 0.0–1.2)
CO2: 22 mmol/L (ref 20–29)
Calcium: 9.6 mg/dL (ref 8.7–10.2)
Chloride: 103 mmol/L (ref 96–106)
Creatinine, Ser: 0.83 mg/dL (ref 0.57–1.00)
Globulin, Total: 3.1 g/dL (ref 1.5–4.5)
Glucose: 143 mg/dL — ABNORMAL HIGH (ref 70–99)
Potassium: 4.1 mmol/L (ref 3.5–5.2)
Sodium: 141 mmol/L (ref 134–144)
Total Protein: 7.6 g/dL (ref 6.0–8.5)
eGFR: 85 mL/min/{1.73_m2} (ref >59)

## 2023-02-01 LAB — LIPID PANEL
Chol/HDL Ratio: 5.8 ratio — ABNORMAL HIGH (ref 0.0–4.4)
Cholesterol, Total: 193 mg/dL (ref 100–199)
HDL: 33 mg/dL — ABNORMAL LOW (ref >39)
LDL Chol Calc (NIH): 119 mg/dL — ABNORMAL HIGH (ref 0–99)
Triglycerides: 230 mg/dL — ABNORMAL HIGH (ref 0–149)
VLDL Cholesterol Cal: 41 mg/dL — ABNORMAL HIGH (ref 5–40)

## 2023-02-14 ENCOUNTER — Other Ambulatory Visit: Payer: Self-pay

## 2023-02-21 ENCOUNTER — Ambulatory Visit: Payer: Self-pay | Admitting: Student

## 2023-02-21 ENCOUNTER — Ambulatory Visit (INDEPENDENT_AMBULATORY_CARE_PROVIDER_SITE_OTHER): Payer: 59 | Admitting: Student

## 2023-02-21 ENCOUNTER — Other Ambulatory Visit: Payer: Self-pay

## 2023-02-21 ENCOUNTER — Other Ambulatory Visit (HOSPITAL_COMMUNITY): Payer: Self-pay

## 2023-02-21 VITALS — BP 130/71 | HR 83 | Ht 60.0 in | Wt 142.4 lb

## 2023-02-21 DIAGNOSIS — K921 Melena: Secondary | ICD-10-CM | POA: Diagnosis not present

## 2023-02-21 DIAGNOSIS — I10 Essential (primary) hypertension: Secondary | ICD-10-CM | POA: Diagnosis not present

## 2023-02-21 DIAGNOSIS — N8110 Cystocele, unspecified: Secondary | ICD-10-CM | POA: Diagnosis not present

## 2023-02-21 DIAGNOSIS — E782 Mixed hyperlipidemia: Secondary | ICD-10-CM

## 2023-02-21 DIAGNOSIS — E119 Type 2 diabetes mellitus without complications: Secondary | ICD-10-CM | POA: Diagnosis not present

## 2023-02-21 LAB — POCT HEMOGLOBIN: Hemoglobin: 9.6 g/dL — AB (ref 11–14.6)

## 2023-02-21 MED ORDER — METFORMIN HCL ER 500 MG PO TB24
500.0000 mg | ORAL_TABLET | Freq: Every day | ORAL | 0 refills | Status: DC
  Filled 2023-02-21: qty 30, 30d supply, fill #0

## 2023-02-21 MED ORDER — ATORVASTATIN CALCIUM 80 MG PO TABS
80.0000 mg | ORAL_TABLET | Freq: Every day | ORAL | 2 refills | Status: DC
  Filled 2023-02-21: qty 30, 30d supply, fill #0

## 2023-02-21 MED ORDER — FERROUS SULFATE 325 (65 FE) MG PO TABS
325.0000 mg | ORAL_TABLET | ORAL | 0 refills | Status: DC
  Filled 2023-02-21: qty 45, 90d supply, fill #0

## 2023-02-21 NOTE — Assessment & Plan Note (Signed)
Ddx broad, likely related to hemorrhoids as she has a history of this in the past.  Cannot rule out, diverticular bleed, duodenal ulcer, metastasis, anal fissures.  Because of patient's age and presentation, would recommend referral to GI, placed this referral stat.  Will have the patient follow-up on Friday.  Point-of-care hemoglobin 9.6 today.  Patient has since stopped bleeding and vital signs are stable.  Given this, can continue with outpatient follow-up.  Endorsed the necessity of returning to clinic on Friday for point-of-care hemoglobin and to evaluate status of GI consult. Additionally, discussed strict ED return precautions if she has another instance of hematochezia.   If the patient misses or re-schedules appt Friday 8/9, please call her to discuss further.

## 2023-02-21 NOTE — Assessment & Plan Note (Signed)
Elevated on last check, see lipid panel in labs. Increased to max dose of atorvastatin, maybe add zetia if still elevated.

## 2023-02-21 NOTE — Progress Notes (Signed)
SUBJECTIVE:   CHIEF COMPLAINT / HPI:   Type 2 Diabetes: Home medications include: None. Previously was a prediabetic and was diet controlled. UTD A1C showed diabetes as noted below.   Most recent A1Cs:  Lab Results  Component Value Date   HGBA1C 7.1 (A) 01/31/2023   HGBA1C 6.4 04/11/2022   Last Microalbumin, LDL, Creatinine: Lab Results  Component Value Date   LDLCALC 119 (H) 01/31/2023   CREATININE 0.83 01/31/2023      Hypertension: BP: 130/71 today. Home medications include: Amlodipine 5 mg. She endorses taking these medications as prescribed.   Most recent creatinine trend:  Lab Results  Component Value Date   CREATININE 0.83 01/31/2023   CREATININE 0.80 04/11/2022   CREATININE 0.91 02/21/2020   Patient has had a BMP in the past 1 year.  Hematochezia:  --Has hemorrhoids  --Blood in BM x7 days, on seventh day, she had diarrhea  --Has not had blood in the stool since last Friday  -- No dizziness or fatigue  --Had cramping in the belly with blood in the stool  --Last Colonoscopy 07/2019  -- had bleeding hemorrhoid during that time, banding performed, resolved bleed  -- No fever or chills  -- No nausea or vomiting at present  -- Normal BM now  -- Has a loss of appetite -- No weight loss   Has in person interpreter   PERTINENT  PMH / PSH: Reviewed   Patient Care Team: Bess Kinds, MD as PCP - General (Family Medicine) OBJECTIVE:  BP 130/71   Pulse 83   Ht 5' (1.524 m)   Wt 142 lb 6.4 oz (64.6 kg)   SpO2 100%   BMI 27.81 kg/m  Physical Exam  General: Alert and oriented in no apparent distress Heart: Regular rate and rhythm with no murmurs appreciated Lungs: CTA bilaterally, no wheezing Abdomen: Bowel sounds present, no abdominal pain, NTTP, non-distended, soft  Skin: Warm and dry Extremities: No lower extremity edema, Cap refill <2 sec  ASSESSMENT/PLAN:  Hematochezia Assessment & Plan: Ddx broad, likely related to hemorrhoids as she has a  history of this in the past.  Cannot rule out, diverticular bleed, duodenal ulcer, metastasis, anal fissures.  Because of patient's age and presentation, would recommend referral to GI, placed this referral stat.  Will have the patient follow-up on Friday.  Point-of-care hemoglobin 9.6 today.  Patient has since stopped bleeding and vital signs are stable.  Given this, can continue with outpatient follow-up.  Endorsed the necessity of returning to clinic on Friday for point-of-care hemoglobin and to evaluate status of GI consult. Additionally, discussed strict ED return precautions if she has another instance of hematochezia.   If the patient misses or re-schedules appt Friday 8/9, please call her to discuss further.   Orders: -     POCT hemoglobin -     Ferrous Sulfate; Take 1 tablet (325 mg total) by mouth every other day.  Dispense: 45 tablet; Refill: 0 -     Ambulatory referral to Gastroenterology  Mixed hyperlipidemia Assessment & Plan: Elevated on last check, see lipid panel in labs. Increased to max dose of atorvastatin, maybe add zetia if still elevated.   Orders: -     Atorvastatin Calcium; Take 1 tablet (80 mg total) by mouth daily.  Dispense: 30 tablet; Refill: 2  Type 2 diabetes mellitus without complication, without long-term current use of insulin (HCC) Assessment & Plan: stable - Last A1c:  Lab Results  Component Value Date   HGBA1C 7.1 (  A) 01/31/2023   - Medications: Metformin added  - Nutritionist referral - Foot exam performed today -Statin increased to 80 mg     Orders: -     metFORMIN HCl ER; Take 1 tablet (500 mg total) by mouth daily with breakfast.  Dispense: 30 tablet; Refill: 0 -     Amb ref to Medical Nutrition Therapy-MNT  Essential hypertension Assessment & Plan: BP: 130/71 today. Well controlled. Goal met. Continue to work on healthy dietary habits and exercise. Follow up Friday.   Medication regimen: Amlodipine 5 mg     Vaginal  prolapse Assessment & Plan: Provided with urogyn information   Please provided nutritionist, Dr. Gerilyn Pilgrim, number to patient on Friday during that appt.    Return in about 4 weeks (around 03/21/2023). Alfredo Martinez, MD 02/21/2023, 5:11 PM PGY-3, [facility]

## 2023-02-21 NOTE — Patient Instructions (Addendum)
It was great to see you today! Thank you for choosing [facility] for your primary care.  Today we addressed: Chronic Conditions  Diabetes -- We will start metformin to take daily  I will refer you to our nutritionist -- you will call her for the appt  Cholesterol levels  We will increase your Atorvastatin to 80 mg daily  We will recheck this at a later date  3.    For blood in the stool, we will refer you back to the GI doctors and I will obtain a blood level  4.    Return in 1 month   Gynecology to call [phone]  If you haven't already, sign up for My Chart to have easy access to your labs results, and communication with your primary care physician. I recommend that you always bring your medications to each appointment as this makes it easy to ensure you are on the correct medications and helps [place] not miss refills when you need them. Call the clinic at [phone] if your symptoms worsen or you have any concerns.  Return in about 4 weeks (around 03/21/2023). Please arrive 15 minutes before your appointment to ensure smooth check in process.  We appreciate your efforts in making this happen.  Thank you for allowing me to participate in your care, Lori Martinez, MD 02/21/2023, 4:20 PM PGY-3, [facility]

## 2023-02-21 NOTE — Assessment & Plan Note (Signed)
stable - Last A1c:  Lab Results  Component Value Date   HGBA1C 7.1 (A) 01/31/2023   - Medications: Metformin added  - Nutritionist referral - Foot exam performed today -Statin increased to 80 mg

## 2023-02-21 NOTE — Assessment & Plan Note (Signed)
BP: 130/71 today. Well controlled. Goal met. Continue to work on healthy dietary habits and exercise. Follow up Friday.   Medication regimen: Amlodipine 5 mg

## 2023-02-21 NOTE — Assessment & Plan Note (Signed)
Provided with urogyn information

## 2023-02-22 ENCOUNTER — Other Ambulatory Visit (HOSPITAL_COMMUNITY): Payer: Self-pay

## 2023-02-24 ENCOUNTER — Other Ambulatory Visit: Payer: Self-pay

## 2023-02-24 ENCOUNTER — Encounter: Payer: Self-pay | Admitting: Student

## 2023-02-24 ENCOUNTER — Ambulatory Visit (INDEPENDENT_AMBULATORY_CARE_PROVIDER_SITE_OTHER): Payer: 59 | Admitting: Student

## 2023-02-24 VITALS — BP 148/78 | HR 75 | Ht 60.0 in | Wt 140.6 lb

## 2023-02-24 DIAGNOSIS — E119 Type 2 diabetes mellitus without complications: Secondary | ICD-10-CM

## 2023-02-24 DIAGNOSIS — K921 Melena: Secondary | ICD-10-CM

## 2023-02-24 LAB — POCT HEMOGLOBIN: Hemoglobin: 10.6 g/dL — AB (ref 11–14.6)

## 2023-02-24 NOTE — Assessment & Plan Note (Addendum)
Repeat hemoglobin 10.6.  Differential includes diverticular bleeding and hemorrhoids.  Return and ED precautions discussed.  Follow-up with GI.

## 2023-02-24 NOTE — Patient Instructions (Signed)
It was great to see you! Thank you for allowing me to participate in your care!   I recommend that you always bring your medications to each appointment as this makes it easy to ensure we are on the correct medications and helps [place] not miss when refills are needed.  Our plans for today:  -Follow-up with GI  Take care and seek immediate care sooner if you develop any concerns. Please remember to show up 15 minutes before your scheduled appointment time!  Tiffany Kocher, DO [facility]

## 2023-02-24 NOTE — Progress Notes (Signed)
    SUBJECTIVE:   CHIEF COMPLAINT / HPI:   Hematochezia Patient presents for follow-up of hematochezia.  Seen August 6 by Dr. Jena Gauss.  At that time point-of-care hemoglobin of 9.6.  Plan to repeat lab work today.  Patient is without hematochezia since last visit.  No dizziness, shortness of breath, chest pain, no nausea vomiting diarrhea.  States she is having her normal bowel movements.  Type 2 diabetes Patient due for UACR, and requesting laboratory work.  OBJECTIVE:   BP (!) 148/78   Pulse 75   Ht 5' (1.524 m)   Wt 140 lb 9.6 oz (63.8 kg)   SpO2 100%   BMI 27.46 kg/m    General: NAD, pleasant Cardio: RRR, no MRG. Cap Refill <2s. Respiratory: CTAB, normal wob on RA GI: Abdomen is soft, not tender, not distended. BS present Skin: Warm and dry  ASSESSMENT/PLAN:   Hematochezia Assessment & Plan: Repeat hemoglobin 10.6.  Differential includes diverticular bleeding and hemorrhoids.  Return and ED precautions discussed.  Follow-up with GI.  Orders: -     POCT hemoglobin  Type 2 diabetes mellitus without complication, without long-term current use of insulin (HCC) Assessment & Plan: Due for UACR.  Continue metformin.  Orders: -     Microalbumin / creatinine urine ratio   Follow-up recommendation Elevated blood pressure x 2.  Recommend repeat blood pressure at next visit, and medication management as necessary.  Tiffany Kocher, DO [facility]

## 2023-02-24 NOTE — Assessment & Plan Note (Addendum)
Due for UACR.  Continue metformin.

## 2023-02-27 ENCOUNTER — Encounter: Payer: Self-pay | Admitting: Student

## 2023-03-08 ENCOUNTER — Other Ambulatory Visit (HOSPITAL_COMMUNITY): Payer: Self-pay

## 2023-03-28 ENCOUNTER — Ambulatory Visit (INDEPENDENT_AMBULATORY_CARE_PROVIDER_SITE_OTHER): Payer: 59 | Admitting: Student

## 2023-03-28 ENCOUNTER — Encounter: Payer: Self-pay | Admitting: Student

## 2023-03-28 ENCOUNTER — Other Ambulatory Visit (HOSPITAL_COMMUNITY): Payer: Self-pay

## 2023-03-28 VITALS — BP 126/74 | HR 70 | Wt 140.0 lb

## 2023-03-28 DIAGNOSIS — E782 Mixed hyperlipidemia: Secondary | ICD-10-CM

## 2023-03-28 DIAGNOSIS — K219 Gastro-esophageal reflux disease without esophagitis: Secondary | ICD-10-CM

## 2023-03-28 DIAGNOSIS — E119 Type 2 diabetes mellitus without complications: Secondary | ICD-10-CM | POA: Diagnosis not present

## 2023-03-28 DIAGNOSIS — I10 Essential (primary) hypertension: Secondary | ICD-10-CM | POA: Diagnosis not present

## 2023-03-28 DIAGNOSIS — G8929 Other chronic pain: Secondary | ICD-10-CM

## 2023-03-28 DIAGNOSIS — Z23 Encounter for immunization: Secondary | ICD-10-CM

## 2023-03-28 DIAGNOSIS — M25511 Pain in right shoulder: Secondary | ICD-10-CM | POA: Diagnosis not present

## 2023-03-28 MED ORDER — OMEPRAZOLE 40 MG PO CPDR
40.0000 mg | DELAYED_RELEASE_CAPSULE | Freq: Every day | ORAL | 4 refills | Status: DC
  Filled 2023-03-28: qty 30, 30d supply, fill #0

## 2023-03-28 MED ORDER — METFORMIN HCL ER 500 MG PO TB24
500.0000 mg | ORAL_TABLET | Freq: Every day | ORAL | 4 refills | Status: DC
  Filled 2023-03-28: qty 30, 30d supply, fill #0
  Filled 2023-05-03: qty 30, 30d supply, fill #1
  Filled 2023-06-07: qty 30, 30d supply, fill #2
  Filled 2023-07-18: qty 30, 30d supply, fill #3
  Filled 2023-08-22: qty 30, 30d supply, fill #4

## 2023-03-28 MED ORDER — ATORVASTATIN CALCIUM 80 MG PO TABS
80.0000 mg | ORAL_TABLET | Freq: Every day | ORAL | 4 refills | Status: DC
  Filled 2023-03-28: qty 30, 30d supply, fill #0
  Filled 2023-05-03: qty 30, 30d supply, fill #1
  Filled 2023-06-07: qty 30, 30d supply, fill #2
  Filled 2023-07-18: qty 30, 30d supply, fill #3
  Filled 2023-08-22: qty 30, 30d supply, fill #4

## 2023-03-28 MED ORDER — AMLODIPINE BESYLATE 5 MG PO TABS
5.0000 mg | ORAL_TABLET | Freq: Every day | ORAL | 3 refills | Status: DC
  Filled 2023-03-28: qty 30, 30d supply, fill #0
  Filled 2023-05-03: qty 30, 30d supply, fill #1
  Filled 2023-06-07: qty 30, 30d supply, fill #2
  Filled 2023-07-18: qty 30, 30d supply, fill #3
  Filled 2023-08-22: qty 30, 30d supply, fill #4
  Filled 2023-09-25: qty 30, 30d supply, fill #5
  Filled 2023-11-08: qty 30, 30d supply, fill #6
  Filled 2024-01-23: qty 30, 30d supply, fill #7

## 2023-03-28 NOTE — Progress Notes (Signed)
SUBJECTIVE:   CHIEF COMPLAINT / HPI:   HTN Meds: Amlodipine 5 mg daily Taking and needs refill  DM Meds: Metformin XR 500 mg daily? Lab Results  Component Value Date   HGBA1C 7.1 (A) 01/31/2023  Taking metformin, but doesn't check sugars at home.   HLD Meds: Lipitor 80 mg Still taking and needs a refill.   GERD Requesting refill of omeprazole.   Rt. Shoulder Pain Can't really move shoulder, has been hurting her for last 2 months. Never had this issue before and denies any injury/trauma. Pain travels down shoulder, through arm. Pain is constant in shoulder, and lifting arm causing pain. Patient able to sleep on shoulder.  PERTINENT  PMH / PSH:  Past Medical History:  Diagnosis Date   Anemia    Constipation    Fatigue    GERD (gastroesophageal reflux disease)    History of 2019 novel coronavirus disease (COVID-19) (08-13-2019  per pt only mild cough/ sore throat and resolved in 2 wks, no further symptoms   in epic ED visit 07-14-2019 ,pt exposed , test positive covid w/ mild dry cough and mild sore throat (no fever, sob, difficulty breathing, body aches, headache)   Hypertension 08-13-2019  in epic ED visit 07-14-2019 pt given norvasc rx for HTN, per note she was to follow up with her pcp which she has not done due bleeding hemorrhoids,  pt stated last dose approx. 2 wks ago   no refills on amlodipine per spouse   Internal hemorrhoid, bleeding    Mixed hyperlipidemia    RECTAL BLEEDING 05/28/2010   Qualifier: Diagnosis of  By: Koleen Distance CMA (AAMA), Hulan Saas     OBJECTIVE:  BP 126/74   Pulse 70   Wt 140 lb (63.5 kg)   SpO2 98%   BMI 27.34 kg/m  Physical Exam Constitutional:      General: She is not in acute distress.    Appearance: Normal appearance. She is normal weight. She is not ill-appearing.  Cardiovascular:     Rate and Rhythm: Normal rate and regular rhythm.     Pulses: Normal pulses.     Heart sounds: Normal heart sounds. No murmur heard.    No friction  rub. No gallop.  Pulmonary:     Effort: Pulmonary effort is normal. No respiratory distress.     Breath sounds: Normal breath sounds. No stridor. No wheezing, rhonchi or rales.  Musculoskeletal:     Right shoulder: Tenderness present. No swelling, deformity, effusion, laceration or crepitus. Normal range of motion. Normal strength.     Left shoulder: Normal.     Comments: Positive Hawkins test, negative [place], negative painful arc, and arm drop, negative neers, negative empty can test  Skin:    Capillary Refill: Capillary refill takes less than 2 seconds.  Neurological:     Mental Status: She is alert.  Psychiatric:        Mood and Affect: Mood normal.        Behavior: Behavior normal.      ASSESSMENT/PLAN:  Encounter for immunization -     Flu vaccine trivalent PF, 6mos and older(Flulaval,Afluria,Fluarix,Fluzone)  Mixed hyperlipidemia Assessment & Plan: Patient reports good compliance with atorvastatin. Will continue.  -Continue Atorvastatin 80 mg -Rpt Lipid panel in 6 months  Orders: -     Atorvastatin Calcium; Take 1 tablet (80 mg total) by mouth daily.  Dispense: 30 tablet; Refill: 4  Type 2 diabetes mellitus without complication, without long-term current use of insulin (HCC) Assessment & Plan:  Report's good compliance with metformin, does not check sugars at home, but will try to. Will continue metformin and have patient f/u in 1 month for A1c check. Med's refilled -Continue metformin 500 mg daily -F/u end of October for A1c check  Orders: -     metFORMIN HCl ER; Take 1 tablet (500 mg total) by mouth daily with breakfast.  Dispense: 30 tablet; Refill: 4  Gastroesophageal reflux disease, unspecified whether esophagitis present -     Omeprazole; Take 1 capsule (40 mg total) by mouth daily.  Dispense: 30 capsule; Refill: 4  Essential hypertension Assessment & Plan: BP at goal, report's no lightheadedness, and good compliance with medication. -Continue amlodipine  5 mg   Chronic right shoulder pain Assessment & Plan: Patient reports shoulder pain for last 2 months, w/o hx of trauma or injury. Shoulder hurts constantly and pain is flared by lifting arm. Patient has FROM, and positive Hawkins test, concerning for shoulder impingement. Discussed possibility of shoulder impingement vs arthritis and desire to obtain x-rays, but patient wanting to hold off at this time. Low concern for rotator cuff tear. Will have patient f/u w/ [facility] at her convenience and use topical pain meds. -OTC pain meds (salonpas/voltaren gel) -F/u [facility], information given -Consider shoulder imaging   Other orders -     amLODIPine Besylate; Take 1 tablet (5 mg total) by mouth daily.  Dispense: 90 tablet; Refill: 3   No follow-ups on file. Bess Kinds, MD 03/28/2023, 9:15 AM PGY-3, [facility]

## 2023-03-28 NOTE — Assessment & Plan Note (Signed)
Patient reports good compliance with atorvastatin. Will continue.  -Continue Atorvastatin 80 mg -Rpt Lipid panel in 6 months

## 2023-03-28 NOTE — Patient Instructions (Signed)
It was great to see you! Thank you for allowing me to participate in your care!  I recommend that you always bring your medications to each appointment as this makes it easy to ensure we are on the correct medications and helps [place] not miss when refills are needed.  Our plans for today:  - Diabetes  Continue taking the Metformin daily Make a follow up appointment at the end of October, where we will check her A1c to see how her sugars have been over the last 3 months  - Shoulder Pain We would have gotten an X-ray of the shoulder, but can hold off until you want to follow this up. I will have you follow up with [facility], call and schedule an appointment when it suits you.  [facility] Address: [address] [place], [place], [place] [address] Phone: [phone]  Pain Control  Can try over the counter lidocaine patches or voltaren gel -Salonpas (lidocaine patch), can be worn for 12 hours, then must be off for 12 hours.  -Voltaren gel applied to area, when not using lidocaine patch     - High Blood Pressure  Your Blood Pressure looks great today!  Continue to take the amlodipine  - High Cholesterol Continue to take the Cholesterol Medication: Atorvastatin.  We will recheck your cholesterol in 6 months   Take care and seek immediate care sooner if you develop any concerns.   Dr. Bess Kinds, MD [facility]

## 2023-03-28 NOTE — Assessment & Plan Note (Signed)
BP at goal, report's no lightheadedness, and good compliance with medication. -Continue amlodipine 5 mg

## 2023-03-28 NOTE — Assessment & Plan Note (Signed)
Patient reports shoulder pain for last 2 months, w/o hx of trauma or injury. Shoulder hurts constantly and pain is flared by lifting arm. Patient has FROM, and positive Hawkins test, concerning for shoulder impingement. Discussed possibility of shoulder impingement vs arthritis and desire to obtain x-rays, but patient wanting to hold off at this time. Low concern for rotator cuff tear. Will have patient f/u w/ [facility] at her convenience and use topical pain meds. -OTC pain meds (salonpas/voltaren gel) -F/u [facility], information given -Consider shoulder imaging

## 2023-03-28 NOTE — Assessment & Plan Note (Signed)
Report's good compliance with metformin, does not check sugars at home, but will try to. Will continue metformin and have patient f/u in 1 month for A1c check. Med's refilled -Continue metformin 500 mg daily -F/u end of October for A1c check

## 2023-04-05 ENCOUNTER — Encounter: Payer: Self-pay | Admitting: Family Medicine

## 2023-04-12 ENCOUNTER — Encounter: Payer: Self-pay | Admitting: Gastroenterology

## 2023-05-02 ENCOUNTER — Encounter: Payer: Self-pay | Admitting: Obstetrics and Gynecology

## 2023-05-02 ENCOUNTER — Ambulatory Visit: Payer: 59 | Admitting: Obstetrics and Gynecology

## 2023-05-02 VITALS — BP 131/83 | HR 76 | Ht 59.06 in | Wt 141.0 lb

## 2023-05-02 DIAGNOSIS — R350 Frequency of micturition: Secondary | ICD-10-CM

## 2023-05-02 DIAGNOSIS — R351 Nocturia: Secondary | ICD-10-CM | POA: Diagnosis not present

## 2023-05-02 DIAGNOSIS — N816 Rectocele: Secondary | ICD-10-CM

## 2023-05-02 DIAGNOSIS — K5904 Chronic idiopathic constipation: Secondary | ICD-10-CM

## 2023-05-02 LAB — POCT URINALYSIS DIPSTICK
Bilirubin, UA: NEGATIVE
Blood, UA: NEGATIVE
Glucose, UA: NEGATIVE
Ketones, UA: NEGATIVE
Leukocytes, UA: NEGATIVE
Nitrite, UA: NEGATIVE
Protein, UA: NEGATIVE
Spec Grav, UA: 1.01 (ref 1.010–1.025)
Urobilinogen, UA: 0.2 U/dL
pH, UA: 7 (ref 5.0–8.0)

## 2023-05-02 NOTE — Progress Notes (Signed)
New Patient Evaluation and Consultation  Referring Provider: Corlis Hove, NP PCP: Bess Kinds, MD Date of Service: 05/02/2023  SUBJECTIVE Chief Complaint: New Patient (Initial Visit) (Lori Sullivan is a 52 y.o. female here for a consult for prolapse.)  History of Present Illness: Lori Sullivan is a 52 y.o. Asian female seen in consultation at the request of NP Corlis Hove for evaluation of prolapse.    Review of records significant for: Has a vaginal bulge that she needs to push back up.   Urinary Symptoms: Does not leak urine.   Day time voids 6-7.  Nocturia: 4-5 times per night to void. Voiding dysfunction:  empties bladder well.  Patient does not use a catheter to empty bladder.  When urinating, patient feels she has no difficulties Drinks: water during the day only Drinks 1 cup water, papaya leaf drink before bedtime.   UTIs:  0  UTI's in the last year.   Denies history of blood in urine and kidney or bladder stones No results found for the last 90 days.   Pelvic Organ Prolapse Symptoms:                  Patient Admits to a feeling of a bulge the vaginal area. It has been present for 1 yr.  Patient Denies seeing a bulge.  This bulge is bothersome. She noticed every time she has a bowel movement. Over time has worsened.   Bowel Symptom: Bowel movements: 1 time(s) per day Stool consistency: hard or loose Straining: yes.  Splinting: no.  Incomplete evacuation: yes.  Patient Denies accidental bowel leakage / fecal incontinence Bowel regimen: previously took stool softener but does not take on a regular basis now, only when she has issues with hard stool  HM Colonoscopy          Colonoscopy (Every 10 Years) Next due on 08/05/2029    08/06/2019  HM COLONOSCOPY   Only the first 1 history entries have been loaded, but more history exists.            Sexual Function Sexually active: no.    Pelvic Pain Denies pelvic pain  Past Medical History:  Past  Medical History:  Diagnosis Date   Anemia    Constipation    Diabetes mellitus without complication (HCC)    Fatigue    GERD (gastroesophageal reflux disease)    History of 2019 novel coronavirus disease (COVID-19) (08-13-2019  per pt only mild cough/ sore throat and resolved in 2 wks, no further symptoms   in epic ED visit 07-14-2019 ,pt exposed , test positive covid w/ mild dry cough and mild sore throat (no fever, sob, difficulty breathing, body aches, headache)   Hypertension 08-13-2019  in epic ED visit 07-14-2019 pt given norvasc rx for HTN, per note she was to follow up with her pcp which she has not done due bleeding hemorrhoids,  pt stated last dose approx. 2 wks ago   no refills on amlodipine per spouse   Internal hemorrhoid, bleeding    Mixed hyperlipidemia    RECTAL BLEEDING 05/28/2010   Qualifier: Diagnosis of  By: Koleen Distance CMA Duncan Dull), Hulan Saas       Past Surgical History:   Past Surgical History:  Procedure Laterality Date   COLONOSCOPY  08-11-2019    DR HUNG  (PER DR WHITE H&P IN EPIC DATED 08-12-2019)   HEMORRHOID SURGERY N/A 09/04/2019   Procedure: HEMORRHOIDECTOMY;  Surgeon: Andria Meuse, MD;  Location: [facility];  Service: General;  Laterality: N/A;   RECTAL EXAM UNDER ANESTHESIA N/A 09/04/2019   Procedure: ANORECTAL EXAM UNDER ANESTHESIA;  Surgeon: Andria Meuse, MD;  [facility];  Service: General;  Laterality: N/A;     Past OB/GYN History: OB History  Gravida Para Term Preterm AB Living  1 1 1  0 0 1  SAB IAB Ectopic Multiple Live Births  0 0 0 0 1    # Outcome Date GA Lbr Len/2nd Weight Sex Type Anes PTL Lv  1 Term      Vag-Spont   LIV    Menopausal: Yes, at age 68, Denies vaginal bleeding since menopause Last pap smear was 02/14/23.  Any history of abnormal pap smears: no.    Component Value Date/Time   DIAGPAP  08/11/2022 1140    - Negative for intraepithelial lesion or malignancy (NILM)   DIAGPAP   03/30/2020 0902    - Negative for intraepithelial lesion or malignancy (NILM)   HPVHIGH Negative 03/30/2020 0902   ADEQPAP  08/11/2022 1140    Satisfactory for evaluation; transformation zone component PRESENT.   ADEQPAP  03/30/2020 0902    Satisfactory for evaluation; transformation zone component PRESENT.    Medications: Patient has a current medication list which includes the following prescription(s): amlodipine, atorvastatin, ferrous sulfate, ibuprofen, and metformin.   Allergies: Patient has No Known Allergies.   Social History:  Social History   Tobacco Use   Smoking status: Never    Passive exposure: Never   Smokeless tobacco: Never  Vaping Use   Vaping status: Never Used  Substance Use Topics   Alcohol use: No   Drug use: No    Relationship status: widowed Patient lives with her daughter.   Patient is employed as a Radio broadcast assistant. Regular exercise: No History of abuse: No  Family History:  History reviewed. No pertinent family history.   Review of Systems: Review of Systems  Constitutional:  Negative for fever, malaise/fatigue and weight loss.  Respiratory:  Negative for cough, shortness of breath and wheezing.   Cardiovascular:  Negative for chest pain, palpitations and leg swelling.  Gastrointestinal:  Negative for abdominal pain and blood in stool.  Genitourinary:  Negative for dysuria.  Musculoskeletal:  Negative for myalgias.  Skin:  Negative for rash.  Neurological:  Negative for dizziness and headaches.  Endo/Heme/Allergies:  Does not bruise/bleed easily.  Psychiatric/Behavioral:  Negative for depression. The patient is not nervous/anxious.      OBJECTIVE Physical Exam: Vitals:   05/02/23 0843  BP: 131/83  Pulse: 76  Weight: 141 lb (64 kg)  Height: 4' 11.06" (1.5 m)    Physical Exam Constitutional:      General: She is not in acute distress. Pulmonary:     Effort: Pulmonary effort is normal.  Abdominal:     General: There is no distension.      Palpations: Abdomen is soft.     Tenderness: There is no abdominal tenderness. There is no rebound.  Musculoskeletal:        General: No swelling. Normal range of motion.  Skin:    General: Skin is warm and dry.     Findings: No rash.  Neurological:     Mental Status: She is alert and oriented to person, place, and time.  Psychiatric:        Mood and Affect: Mood normal.        Behavior: Behavior normal.      GU / Detailed Urogynecologic Evaluation:  Pelvic  Exam: Normal external female genitalia; Bartholin's and Skene's glands normal in appearance; urethral meatus normal in appearance, no urethral masses or discharge.   CST: negative  Speculum exam reveals normal vaginal mucosa with atrophy. Cervix normal appearance. Uterus normal single, nontender. Adnexa no mass, fullness, tenderness.     Pelvic floor strength I/V, puborectalis IV/V external anal sphincter IV/V  Pelvic floor musculature: Right levator non-tender, Right obturator non-tender, Left levator non-tender, Left obturator non-tender  POP-Q:   POP-Q  -2                                            Aa   -2                                           Ba  -8                                              C   2                                            Gh  3.5                                            Pb  8                                            tvl   -1                                            Ap  -1                                            Bp  -7.5                                              D      Rectal Exam:  Normal sphincter tone, small distal rectocele, thin rectovaginal septum, enterocoele not present, no rectal masses  Post-Void Residual (PVR) by Bladder Scan: In order to evaluate bladder emptying, we discussed obtaining a postvoid residual and patient agreed to this procedure.  Procedure: The ultrasound unit was placed on the patient's abdomen in the suprapubic region after the  patient had voided.    Post Void Residual - 05/02/23 0849       Post Void Residual   Post Void Residual 18 mL  Laboratory Results: Lab Results  Component Value Date   COLORU yellow 05/02/2023   CLARITYU clear 05/02/2023   GLUCOSEUR Negative 05/02/2023   BILIRUBINUR negative 05/02/2023   KETONESU negative 05/02/2023   SPECGRAV 1.010 05/02/2023   RBCUR negative 05/02/2023   PHUR 7.0 05/02/2023   PROTEINUR Negative 05/02/2023   UROBILINOGEN 0.2 05/02/2023   LEUKOCYTESUR Negative 05/02/2023    Lab Results  Component Value Date   CREATININE 0.83 01/31/2023   CREATININE 0.80 04/11/2022   CREATININE 0.91 02/21/2020    Lab Results  Component Value Date   HGBA1C 7.1 (A) 01/31/2023    Lab Results  Component Value Date   HGB 10.6 (A) 02/24/2023     ASSESSMENT AND PLAN Ms. Hanselman is a 52 y.o. with:  1. Prolapse of posterior vaginal wall   2. Chronic idiopathic constipation   3. Frequent urination   4. Nocturia    Stage 0 anterior, Stage II posterior, Stage 0 apical prolapse - Mild posterior vaginal prolapse present. Thin rectovaginal septum, likely has hard stool getting stuck.  - recommended she treat constipation to prevent hard stools. Start miralax daily.  - For treatment of pelvic organ prolapse, we discussed options for management including expectant management, conservative management, and surgical management, such as Kegels, a pessary, pelvic floor physical therapy, and specific surgical procedures. - She is not interested in surgery. Prefers to start with pelvic PT, referral placed. She may be interested in a pessary in the future.   2. Nocturia/ frequent urination - not bothered overall. She will work on avoiding drinking at night.   Return as needed  Marguerita Beards, MD

## 2023-05-02 NOTE — Patient Instructions (Addendum)
Avoid drinking 2-3 hours prior to bedtime.   Constipation: Our goal is to achieve formed bowel movements daily or every-other-day.  You may need to try different combinations of the following options to find what works best for you - everybody's body works differently so feel free to adjust the dosages as needed.  Some options to help maintain bowel health include:  Dietary changes (more leafy greens, vegetables and fruits; less processed foods) Fiber supplementation (Benefiber, FiberCon, Metamucil or Psyllium). Start slow and increase gradually to full dose. Over-the-counter agents such as: stool softeners (Docusate or Colace) and/or laxatives (Miralax, milk of magnesia)  "Power Pudding" is a natural mixture that may help your constipation.  To make blend 1 cup applesauce, 1 cup wheat bran, and 3/4 cup prune juice, refrigerate and then take 1 tablespoon daily with a large glass of water as needed.

## 2023-05-10 ENCOUNTER — Ambulatory Visit (INDEPENDENT_AMBULATORY_CARE_PROVIDER_SITE_OTHER): Payer: 59 | Admitting: Student

## 2023-05-10 ENCOUNTER — Other Ambulatory Visit: Payer: Self-pay

## 2023-05-10 ENCOUNTER — Encounter: Payer: Self-pay | Admitting: Student

## 2023-05-10 VITALS — BP 130/65 | HR 74 | Ht 59.0 in | Wt 139.4 lb

## 2023-05-10 DIAGNOSIS — R202 Paresthesia of skin: Secondary | ICD-10-CM | POA: Diagnosis not present

## 2023-05-10 DIAGNOSIS — B351 Tinea unguium: Secondary | ICD-10-CM | POA: Insufficient documentation

## 2023-05-10 DIAGNOSIS — E119 Type 2 diabetes mellitus without complications: Secondary | ICD-10-CM

## 2023-05-10 DIAGNOSIS — R200 Anesthesia of skin: Secondary | ICD-10-CM | POA: Insufficient documentation

## 2023-05-10 DIAGNOSIS — Z7984 Long term (current) use of oral hypoglycemic drugs: Secondary | ICD-10-CM | POA: Diagnosis not present

## 2023-05-10 LAB — POCT GLYCOSYLATED HEMOGLOBIN (HGB A1C): HbA1c, POC (prediabetic range): 6.6 % — AB (ref 5.7–6.4)

## 2023-05-10 NOTE — Progress Notes (Signed)
  SUBJECTIVE:   CHIEF COMPLAINT / HPI:   F/u Diabetes -A1c Check.  Meds: Metformin XR 500 mg Lab Results  Component Value Date   HGBA1C 6.6 (A) 05/10/2023   Toenail discoloration Slightly discolored portion of great toe, been there for a week, not sure if changing in size, not hurting or itching.   Numbness in legs Has been having numbness in legs when standing too long, but not dizziness. No pain. Sits down and numbness goes away. Appreciating some balance issues at times. This has been going on for 1-2 months, happening everyday, lasting 2-3 minutes, but can be longer if she cannot sit down.   PERTINENT  PMH / PSH:    OBJECTIVE:  BP 130/65   Pulse 74   Ht 4\' 11"  (1.499 m)   Wt 139 lb 6.4 oz (63.2 kg)   SpO2 99%   BMI 28.16 kg/m  Physical Exam Constitutional:      General: She is not in acute distress.    Appearance: Normal appearance. She is not ill-appearing.  Neurological:     Mental Status: She is alert.     Cranial Nerves: Cranial nerves 2-12 are intact. No cranial nerve deficit, dysarthria or facial asymmetry.     Sensory: Sensation is intact. No sensory deficit.     Motor: Motor function is intact. No weakness or tremor.      ASSESSMENT/PLAN:  Type 2 diabetes mellitus without complication, without long-term current use of insulin (HCC) Assessment & Plan: Patient comes for follow-up of her diabetes.  Diabetes well-managed with A1c of 6.6.  Goal less than 7.  Patient taking metformin XR as recommended.  Tolerating well.  Will continue regimen. - Metformin XR 500 mg daily - Follow-up 6 months  Orders: -     POCT glycosylated hemoglobin (Hb A1C)  Numbness and tingling of both legs Assessment & Plan: Patient complaining of numbness in legs that occurs when she stands for too long's, but goes away when she sits down.  Patient reports this has been going on for 1 to 2 months. Patient denies any dizziness, but does appreciate some balance issues at times.  This  happens every day, and will persist until she sits down.  Unsure what could be causing this, some concern for may be nerve compression in the back however patient not complaining of pain.  Patient had normal neuroexam and full sensation.  Will test for neuropathy, diabetes well-controlled less likely to be cause of numbness.  Will have patient keep journal. - Journal of symptoms - Consider back imaging if persist or worsen - RPR, B12  Orders: -     Vitamin B12 -     RPR w/reflex to TrepSure  Toenail fungus Assessment & Plan: Patient comes in with toenail discoloration has been there for 1 week.  Toenail discoloration small, slightly darker, and slightly thickened taking up upper quarter of nail bed.  Patient toenail most concerning for fungal infection.  Will recommend over-the-counter toenail antifungal.  Discussed that this might take months to resolve. - Over-the-counter toenail treatment - Follow-up as needed    No follow-ups on file. Bess Kinds, MD 05/10/2023, 9:42 AM PGY-3, [facility]

## 2023-05-10 NOTE — Assessment & Plan Note (Signed)
Patient comes for follow-up of her diabetes.  Diabetes well-managed with A1c of 6.6.  Goal less than 7.  Patient taking metformin XR as recommended.  Tolerating well.  Will continue regimen. - Metformin XR 500 mg daily - Follow-up 6 months

## 2023-05-10 NOTE — Assessment & Plan Note (Signed)
Patient complaining of numbness in legs that occurs when she stands for too long's, but goes away when she sits down.  Patient reports this has been going on for 1 to 2 months. Patient denies any dizziness, but does appreciate some balance issues at times.  This happens every day, and will persist until she sits down.  Unsure what could be causing this, some concern for may be nerve compression in the back however patient not complaining of pain.  Patient had normal neuroexam and full sensation.  Will test for neuropathy, diabetes well-controlled less likely to be cause of numbness.  Will have patient keep journal. - Journal of symptoms - Consider back imaging if persist or worsen - RPR, B12

## 2023-05-10 NOTE — Assessment & Plan Note (Addendum)
Patient comes in with toenail discoloration has been there for 1 week.  Toenail discoloration small, slightly darker, and slightly thickened taking up upper quarter of nail bed.  Patient toenail most concerning for fungal infection.  Will recommend over-the-counter toenail antifungal.  Discussed that this might take months to resolve. - Over-the-counter toenail treatment - Follow-up as needed

## 2023-05-10 NOTE — Patient Instructions (Addendum)
It was great to see you! Thank you for allowing me to participate in your care!  I recommend that you always bring your medications to each appointment as this makes it easy to ensure we are on the correct medications and helps [place] not miss when refills are needed.  Our plans for today:  - Diabetes  Your A1c was great today! Keep up the good work  Continue taking the metformin daily  - Toe Nail Fungus Use Daily over the counter toe nail fungus treatment   - Numbness / Dizziness Keep journal of when the symptoms happen, how long the last, what you were doing when they happen,  and any other relevant information you can think of. Let me know if this continues to happen or get's worse.   We are checking some labs today to see if this could be playing a role.   We are checking some labs today, I will call you if they are abnormal will send you a MyChart message or a letter if they are normal.  If you do not hear about your labs in the next 2 weeks please let us know.  Take care and seek immediate care sooner if you develop any concerns.   Dr. Bess Kinds, MD [facility]

## 2023-05-11 LAB — TREPONEMAL ANTIBODIES, TPPA: Treponemal Antibodies, TPPA: NONREACTIVE

## 2023-05-11 LAB — VITAMIN B12: Vitamin B-12: 393 pg/mL (ref 232–1245)

## 2023-05-11 LAB — RPR W/REFLEX TO TREPSURE: RPR: NONREACTIVE

## 2023-05-15 ENCOUNTER — Ambulatory Visit: Payer: 59

## 2023-06-07 ENCOUNTER — Other Ambulatory Visit: Payer: Self-pay | Admitting: Student

## 2023-06-07 ENCOUNTER — Other Ambulatory Visit (HOSPITAL_COMMUNITY): Payer: Self-pay

## 2023-06-07 DIAGNOSIS — R1013 Epigastric pain: Secondary | ICD-10-CM

## 2023-06-07 DIAGNOSIS — K921 Melena: Secondary | ICD-10-CM

## 2023-06-08 ENCOUNTER — Other Ambulatory Visit (HOSPITAL_COMMUNITY): Payer: Self-pay

## 2023-06-08 ENCOUNTER — Other Ambulatory Visit: Payer: Self-pay | Admitting: Student

## 2023-06-08 DIAGNOSIS — R1013 Epigastric pain: Secondary | ICD-10-CM

## 2023-06-08 MED ORDER — FERROUS SULFATE 325 (65 FE) MG PO TABS
325.0000 mg | ORAL_TABLET | ORAL | 0 refills | Status: DC
  Filled 2023-06-08: qty 45, 90d supply, fill #0

## 2023-06-09 ENCOUNTER — Other Ambulatory Visit (HOSPITAL_COMMUNITY): Payer: Self-pay

## 2023-06-23 ENCOUNTER — Other Ambulatory Visit (HOSPITAL_COMMUNITY): Payer: Self-pay

## 2023-06-23 ENCOUNTER — Other Ambulatory Visit: Payer: 59

## 2023-06-23 ENCOUNTER — Encounter: Payer: Self-pay | Admitting: Family Medicine

## 2023-06-23 ENCOUNTER — Ambulatory Visit (INDEPENDENT_AMBULATORY_CARE_PROVIDER_SITE_OTHER): Payer: 59 | Admitting: Family Medicine

## 2023-06-23 VITALS — BP 123/55 | HR 83 | Ht 59.0 in | Wt 140.4 lb

## 2023-06-23 DIAGNOSIS — K219 Gastro-esophageal reflux disease without esophagitis: Secondary | ICD-10-CM

## 2023-06-23 DIAGNOSIS — R200 Anesthesia of skin: Secondary | ICD-10-CM

## 2023-06-23 DIAGNOSIS — R202 Paresthesia of skin: Secondary | ICD-10-CM | POA: Diagnosis not present

## 2023-06-23 MED ORDER — OMEPRAZOLE 40 MG PO CPDR
40.0000 mg | DELAYED_RELEASE_CAPSULE | Freq: Every day | ORAL | 3 refills | Status: DC
  Filled 2023-06-23: qty 30, 30d supply, fill #0

## 2023-06-23 NOTE — Assessment & Plan Note (Signed)
Ongoing problem.  At last visit B12 was found to be low normal.  Symptoms and course sound suspicious for PAD especially given difficulty palpating pedal pulses on left.  Without severe pain, color change to bilateral LEs, or shortness of breath I have very low suspicion for DVT. - ABIs to further evaluate blood flow - Consider referral to vascular pending results

## 2023-06-23 NOTE — Assessment & Plan Note (Signed)
-   Refilled omeprazole 40 mg today, patient may continue to take daily - Unsure if there is a problem with insurance covering this; instructed patient that she can purchase Prilosec OTC if insurance will not cover prescription

## 2023-06-23 NOTE — Patient Instructions (Addendum)
Dear Lori Sullivan  Today we discussed the following concerns and plans:  Leg pain: - An ABI was ordered for you---you have an appointment today at 2:40pm. Go to [facility] [address] [place]   Prescriptions I am sending to your pharmacy: - omeprazole - if your insurance won't cover this you can buy Prilosec OTC    If you have any concerns, please call the clinic or schedule an appointment.  It was a pleasure to take care of you today. Be well!  Cyndia Skeeters, DO [facility], PGY-1

## 2023-06-23 NOTE — Progress Notes (Signed)
    SUBJECTIVE:   CHIEF COMPLAINT / HPI:  In person interpreter assisted today.  Accompanied by daughter today who also serves as interpreter.  Leg weakness Patient has been seen for this before in October.  Continues to have numbness and pain to both calves when walking, alleviated with rest.  No skin color change.  No excessive cold to either foot.  Symptoms are not worsening, very bothersome and impacting her daily activities.  No palpitations, shortness of breath.  Additionally patient has a question about omeprazole which was previously prescribed for her GERD, however her last refill request was denied.  She would like to have this medicine.  PERTINENT  PMH / PSH:  Anemia T2DM GERD HTN HLD  OBJECTIVE:   BP (!) 123/55   Pulse 83   Ht 4\' 11"  (1.499 m)   Wt 140 lb 6.4 oz (63.7 kg)   SpO2 100%   BMI 28.36 kg/m    General: Well-appearing, no acute distress. Cardio: Regular rate, regular rhythm, no murmurs on exam. Extremities: no peripheral edema. Moves all extremities equally. Neuro: Alert and oriented.  Bilateral LE strength intact 5/5.  Strong pedal pulse on right, 2+.  Unable to find pedal pulse on the left.  Skin is warm to both feet, without color change.  No color change on skin of bilateral calves.   ASSESSMENT/PLAN:   Numbness and tingling of both legs Ongoing problem.  At last visit B12 was found to be low normal.  Symptoms and course sound suspicious for PAD especially given difficulty palpating pedal pulses on left.  Without severe pain, color change to bilateral LEs, or shortness of breath I have very low suspicion for DVT. - ABIs to further evaluate blood flow - Consider referral to vascular pending results  GERD - Refilled omeprazole 40 mg today, patient may continue to take daily - Unsure if there is a problem with insurance covering this; instructed patient that she can purchase Prilosec OTC if insurance will not cover prescription    Cyndia Skeeters,  DO [facility]

## 2023-06-29 ENCOUNTER — Ambulatory Visit
Admission: RE | Admit: 2023-06-29 | Discharge: 2023-06-29 | Disposition: A | Discharge status: Home / Self Care | Payer: 59 | Source: Ambulatory Visit | Attending: Family Medicine | Admitting: Family Medicine

## 2023-06-29 ENCOUNTER — Other Ambulatory Visit: Payer: 59

## 2023-06-29 DIAGNOSIS — M79662 Pain in left lower leg: Secondary | ICD-10-CM | POA: Diagnosis not present

## 2023-06-29 DIAGNOSIS — M79661 Pain in right lower leg: Secondary | ICD-10-CM | POA: Diagnosis not present

## 2023-06-29 DIAGNOSIS — R200 Anesthesia of skin: Secondary | ICD-10-CM

## 2023-07-25 ENCOUNTER — Ambulatory Visit: Payer: 59 | Admitting: Gastroenterology

## 2023-07-31 NOTE — Therapy (Signed)
 OUTPATIENT PHYSICAL THERAPY FEMALE PELVIC EVALUATION   Patient Name: Lori Sullivan MRN: [id number] DOB:Aug 06, 1970, 53 y.o., female Today's Date: 08/01/2023  END OF SESSION:  PT End of Session - 08/01/23 0937     Visit Number 1    Date for PT Re-Evaluation 01/29/24    Authorization Type [employer]    Authorization - Number of Visits 27    PT Start Time 0930    PT Stop Time 1015    PT Time Calculation (min) 45 min    Activity Tolerance Patient tolerated treatment well    Behavior During Therapy WFL for tasks assessed/performed             Past Medical History:  Diagnosis Date   Anemia    Constipation    Diabetes mellitus without complication (HCC)    Fatigue    GERD (gastroesophageal reflux disease)    History of 2019 novel coronavirus disease (COVID-19) (08-13-2019  per pt only mild cough/ sore throat and resolved in 2 wks, no further symptoms   in epic ED visit 07-14-2019 ,pt exposed , test positive covid w/ mild dry cough and mild sore throat (no fever, sob, difficulty breathing, body aches, headache)   Hypertension 08-13-2019  in epic ED visit 07-14-2019 pt given norvasc  rx for HTN, per note she was to follow up with her pcp which she has not done due bleeding hemorrhoids,  pt stated last dose approx. 2 wks ago   no refills on amlodipine  per spouse   Internal hemorrhoid, bleeding    Mixed hyperlipidemia    RECTAL BLEEDING 05/28/2010   Qualifier: Diagnosis of  By: Genie CMA LEODIS), Chick     Past Surgical History:  Procedure Laterality Date   COLONOSCOPY  08-11-2019    DR HUNG  (PER DR WHITE H&P IN EPIC DATED 08-12-2019)   HEMORRHOID SURGERY N/A 09/04/2019   Procedure: HEMORRHOIDECTOMY;  Surgeon: Teresa Lonni HERO, MD;  Location: [facility];  Service: General;  Laterality: N/A;   RECTAL EXAM UNDER ANESTHESIA N/A 09/04/2019   Procedure: ANORECTAL EXAM UNDER ANESTHESIA;  Surgeon: Teresa Lonni HERO, MD;  Location: [facility];  Service:  General;  Laterality: N/A;   Patient Active Problem List   Diagnosis Date Noted   Numbness and tingling of both legs 05/10/2023   Toenail fungus 05/10/2023   Shoulder pain, right 03/28/2023   Hematochezia 02/21/2023   Vaginal prolapse 01/31/2023   Encounter for screening mammogram for breast cancer 04/11/2022   Sciatica of left side 07/27/2021   Iron deficiency anemia 07/27/2021   IT band syndrome 07/13/2021   Type 2 diabetes mellitus without complication, without long-term current use of insulin (HCC) 07/13/2021   Urinary frequency 03/30/2020   Bilateral sciatica 03/28/2019   Mixed hyperlipidemia 07/12/2018   Thyromegaly 01/04/2015   Essential hypertension 12/05/2014   Internal and external bleeding hemorrhoids 02/20/2013   GERD 05/28/2010    PCP: Jennelle Riis, MD  REFERRING PROVIDER: Marilynne Rosaline SAILOR, MD   REFERRING DIAG:  R35.1 (ICD-10-CM) - Nocturia  N81.6 (ICD-10-CM) - Prolapse of posterior vaginal wall    THERAPY DIAG:  Cramp and spasm  Pelvic floor dysfunction  Rationale for Evaluation and Treatment: Rehabilitation  ONSET DATE: 07/2022  SUBJECTIVE:  SUBJECTIVE STATEMENT: Interpreter is present during the evaluation. Patient started to feel something come out of the vagina.  Fluid intake: Yes: water    PAIN:  Are you having pain? No  PRECAUTIONS: None  RED FLAGS: None   WEIGHT BEARING RESTRICTIONS: No  FALLS:  Has patient fallen in last 6 months? No  LIVING ENVIRONMENT: Lives with: lives with their family and lives with their daughter   OCCUPATION: nails  PLOF: Independent  PATIENT GOALS: reduce prolapse and frequent urination at night  PERTINENT HISTORY:  Diabetes; Hemorrhoid surgery 2021  BOWEL MOVEMENT: Pain with bowel movement: No Type of bowel  movement:Type (Bristol Stool Scale) Type 1,2,4,5, Frequency 1 time per day, Strain Yes, and Splinting no Fully empty rectum: Yes:   Leakage: No Fiber supplement: No  URINATION: Pain with urination: No Fully empty bladder: Yes:   Stream: Strong Urgency: No Frequency: Day time voids 6-7.  Nocturia: 2-3 times per night to void  Leakage:  none Pads: No  INTERCOURSE:not active  PREGNANCY: Vaginal deliveries 1 Tearing Yes: episiotomy  PROLAPSE: Stage 0 anterior, Stage II posterior, Stage 0 apical prolapse   OBJECTIVE:  Note: Objective measures were completed at Evaluation unless otherwise noted.  DIAGNOSTIC FINDINGS:  Pelvic floor strength I/V, puborectalis IV/V external anal sphincter IV/V  PVR 18 mL   COGNITION: Overall cognitive status: Within functional limits for tasks assessed     SENSATION: Light touch: Appears intact Proprioception: Appears intact    POSTURE: decreased lumbar lordosis  PELVIC ALIGNMENT:  LUMBARAROM/PROM:  A/PROM A/PROM  eval  Extension Decreased by 25%   (Blank rows = not tested)  LOWER EXTREMITY MNF:Apojuzmjo hip ROM is full   LOWER EXTREMITY FFU:Apojuzmjo hip strength is 5/5   PALPATION:   General  Decreased movement of the lower rib cage; difficulty with contracting her abdomen;                 External Perineal Exam increased skin outside the rectum, perineal body, levator ani and sphincter are tight                             Internal Pelvic Floor tightness along the puborectalis, along the iliococcygeus and anterior rectum. Patient has difficulty with coordination relaxation of the rectum and will contract the sphincters instead of relax. She will contract the gluteals to contract the sphincter muscles.   Patient confirms identification and approves PT to assess internal pelvic floor and treatment Yes  PELVIC MMT:   MMT eval  Internal Anal Sphincter 3/5  External Anal Sphincter 3/5  Puborectalis 3/5  (Blank rows = not  tested)        TONE: increased  PROLAPSE: Posterior wall weakness  TODAY'S TREATMENT:                                                                                                                              DATE:  08/01/23  EVAL See below   PATIENT EDUCATION:  Education details: educated on what the findings were from the evaluation, what treatment will entail and reviewed goals, educated patient on what a posterior prolapse was Person educated: Patient and daughter, and interpreter Education method: Explanation Education comprehension: verbalized understanding  HOME EXERCISE PROGRAM: See above  ASSESSMENT:  CLINICAL IMPRESSION: Patient is a 53 y.o. female who was seen today for physical therapy evaluation and treatment for nocturia and posterior prolapse. Interpreter present during the evaluation. Patient reports she started to feel a bulge vaginally 1 year ago. She reports she gets up at night 2-3 times per night and is not an issue for her at this time.  Patient has to strain to have a bowel movement. Her pelvic floor strength is 3/5. She will contract her gluteal to contract the anus. She will bulge her rectum when she needs to contract. She will contract her pelvic floor when it has to relax. She had tightness in the perineal body, around the anal sphincter and levator ani. Patient will benefit from skilled therapy to work on pelvic floor coordination, educated ways to not strain to have a bowel movement to reduce further progression of the posterior wall prolapse.   OBJECTIVE IMPAIRMENTS: decreased coordination and decreased strength.   ACTIVITY LIMITATIONS: toileting  PARTICIPATION LIMITATIONS: community activity  PERSONAL FACTORS: Time since onset of injury/illness/exacerbation are also affecting patient's functional outcome.   REHAB POTENTIAL: Good  CLINICAL DECISION MAKING: Stable/uncomplicated  EVALUATION COMPLEXITY: Low   GOALS: Goals reviewed with patient?  Yes  SHORT TERM GOALS: Target date: 08/29/23  Patient independent with flexibility exercises.  Baseline: not educated yet Goal status: INITIAL    LONG TERM GOALS: Target date: 01/29/24  Patient is independent with advanced HEP for core and pelvic floor.  Baseline: not educated yet.  Goal status: INITIAL  2.  Patient is able to elongate her rectum when bearing down and not contract the muscles so she is able to have a bowel movement without straining 75% of the time.  Baseline: She contracts her rectum when trying to push a bowel movement out.  Goal status: INITIAL  3.  Patient is able to perform diaphragmatic breathing to elongate the pelvic floor and the therapist can feel the pelvic floor drop.  Baseline: not able to perform diaphragmatic breathing.  Goal status: INITIAL  4.  Patient understands ways to manage her posterior wall prolapse to reduce the risk of further progression.  Baseline: not educated yet Goal status: INITIAL   PLAN:  PT FREQUENCY: 1x/week  PT DURATION: 6 months  PLANNED INTERVENTIONS: 97110-Therapeutic exercises, 97530- Therapeutic activity, 97112- Neuromuscular re-education, 97140- Manual therapy, Patient/Family education, Dry Needling, Spinal mobilization, Moist heat, and Biofeedback  PLAN FOR NEXT SESSION: manual work around the anus and perineal body, diaphragmatic breathing, hip stretches   Channing Pereyra, PT 08/01/23 12:53 PM

## 2023-08-01 ENCOUNTER — Other Ambulatory Visit: Payer: Self-pay

## 2023-08-01 ENCOUNTER — Encounter
Payer: No Typology Code available for payment source | Attending: Obstetrics and Gynecology | Admitting: Physical Therapy

## 2023-08-01 ENCOUNTER — Encounter: Payer: Self-pay | Admitting: Physical Therapy

## 2023-08-01 DIAGNOSIS — M6289 Other specified disorders of muscle: Secondary | ICD-10-CM | POA: Insufficient documentation

## 2023-08-01 DIAGNOSIS — R252 Cramp and spasm: Secondary | ICD-10-CM | POA: Diagnosis present

## 2023-08-08 ENCOUNTER — Encounter: Payer: Self-pay | Admitting: Physical Therapy

## 2023-08-08 ENCOUNTER — Encounter: Payer: No Typology Code available for payment source | Admitting: Physical Therapy

## 2023-08-08 DIAGNOSIS — R252 Cramp and spasm: Secondary | ICD-10-CM | POA: Diagnosis not present

## 2023-08-08 DIAGNOSIS — M6289 Other specified disorders of muscle: Secondary | ICD-10-CM

## 2023-08-08 NOTE — Therapy (Addendum)
 OUTPATIENT PHYSICAL THERAPY FEMALE PELVIC TREATMENT  Patient Name: Lori Sullivan MRN: [id number] DOB:Dec 08, 1970, 53 y.o., female Today's Date: 08/08/2023  END OF SESSION:  PT End of Session - 08/08/23 0939     Visit Number 2    Date for PT Re-Evaluation 01/29/24    Authorization Type [employer] next    Authorization Time Period 08/03/23-01/29/24    Authorization - Visit Number 1    Authorization - Number of Visits 10    PT Start Time 0930    PT Stop Time 1015    PT Time Calculation (min) 45 min    Activity Tolerance Patient tolerated treatment well    Behavior During Therapy [facility] for tasks assessed/performed             Past Medical History:  Diagnosis Date   Anemia    Constipation    Diabetes mellitus without complication (HCC)    Fatigue    GERD (gastroesophageal reflux disease)    History of 2019 novel coronavirus disease (COVID-19) (08-13-2019  per pt only mild cough/ sore throat and resolved in 2 wks, no further symptoms   in epic ED visit 07-14-2019 ,pt exposed , test positive covid w/ mild dry cough and mild sore throat (no fever, sob, difficulty breathing, body aches, headache)   Hypertension 08-13-2019  in epic ED visit 07-14-2019 pt given norvasc  rx for HTN, per note she was to follow up with her pcp which she has not done due bleeding hemorrhoids,  pt stated last dose approx. 2 wks ago   no refills on amlodipine  per spouse   Internal hemorrhoid, bleeding    Mixed hyperlipidemia    RECTAL BLEEDING 05/28/2010   Qualifier: Diagnosis of  By: Genie CMA LEODIS), Chick     Past Surgical History:  Procedure Laterality Date   COLONOSCOPY  08-11-2019    DR HUNG  (PER DR WHITE H&P IN EPIC DATED 08-12-2019)   HEMORRHOID SURGERY N/A 09/04/2019   Procedure: HEMORRHOIDECTOMY;  Surgeon: Teresa Lonni HERO, MD;  Location: [facility] [place];  Service: General;  Laterality: N/A;   RECTAL EXAM UNDER ANESTHESIA N/A 09/04/2019   Procedure: ANORECTAL EXAM UNDER ANESTHESIA;   Surgeon: Teresa Lonni HERO, MD;  Location: [facility];  Service: General;  Laterality: N/A;   Patient Active Problem List   Diagnosis Date Noted   Numbness and tingling of both legs 05/10/2023   Toenail fungus 05/10/2023   Shoulder pain, right 03/28/2023   Hematochezia 02/21/2023   Vaginal prolapse 01/31/2023   Encounter for screening mammogram for breast cancer 04/11/2022   Sciatica of left side 07/27/2021   Iron deficiency anemia 07/27/2021   IT band syndrome 07/13/2021   Type 2 diabetes mellitus without complication, without long-term current use of insulin (HCC) 07/13/2021   Urinary frequency 03/30/2020   Bilateral sciatica 03/28/2019   Mixed hyperlipidemia 07/12/2018   Thyromegaly 01/04/2015   Essential hypertension 12/05/2014   Internal and external bleeding hemorrhoids 02/20/2013   GERD 05/28/2010    PCP: Jennelle Riis, MD  REFERRING PROVIDER: Marilynne Rosaline SAILOR, MD   REFERRING DIAG:  R35.1 (ICD-10-CM) - Nocturia  N81.6 (ICD-10-CM) - Prolapse of posterior vaginal wall    THERAPY DIAG:  Cramp and spasm  Pelvic floor dysfunction  Rationale for Evaluation and Treatment: Rehabilitation  ONSET DATE: 07/2022  SUBJECTIVE:  SUBJECTIVE STATEMENT: Interpreter is present during the treatment. I have better poops. The are long and softer.  Fluid intake: Yes: water   PAIN:  Are you having pain? No  PRECAUTIONS: None  RED FLAGS: None   WEIGHT BEARING RESTRICTIONS: No  FALLS:  Has patient fallen in last 6 months? No  LIVING ENVIRONMENT: Lives with: lives with their family and lives with their daughter   OCCUPATION: nails  PLOF: Independent  PATIENT GOALS: reduce prolapse and frequent urination at night  PERTINENT HISTORY:  Diabetes; Hemorrhoid surgery  2021  BOWEL MOVEMENT: Pain with bowel movement: No Type of bowel movement:Type (Bristol Stool Scale) Type 1,2,4,5, Frequency 1 time per day, Strain Yes, and Splinting no Fully empty rectum: Yes:   Leakage: No Fiber supplement: No  URINATION: Pain with urination: No Fully empty bladder: Yes:   Stream: Strong Urgency: No Frequency: Day time voids 6-7.  Nocturia: 2-3 times per night to void  Leakage: none Pads: No  INTERCOURSE:not active  PREGNANCY: Vaginal deliveries 1 Tearing Yes: episiotomy  PROLAPSE: Stage 0 anterior, Stage II posterior, Stage 0 apical prolapse   OBJECTIVE:  Note: Objective measures were completed at Evaluation unless otherwise noted.  DIAGNOSTIC FINDINGS:  Pelvic floor strength I/V, puborectalis IV/V external anal sphincter IV/V  PVR 18 mL   COGNITION: Overall cognitive status: Within functional limits for tasks assessed     SENSATION: Light touch: Appears intact Proprioception: Appears intact    POSTURE: decreased lumbar lordosis  PELVIC ALIGNMENT:  LUMBARAROM/PROM:  A/PROM A/PROM  eval  Extension Decreased by 25%   (Blank rows = not tested)  LOWER EXTREMITY MNF:Apojuzmjo hip ROM is full   LOWER EXTREMITY FFU:Apojuzmjo hip strength is 5/5   PALPATION:   General  Decreased movement of the lower rib cage; difficulty with contracting her abdomen;                 External Perineal Exam increased skin outside the rectum, perineal body, levator ani and sphincter are tight                             Internal Pelvic Floor tightness along the puborectalis, along the iliococcygeus and anterior rectum. Patient has difficulty with coordination relaxation of the rectum and will contract the sphincters instead of relax. She will contract the gluteals to contract the sphincter muscles.   Patient confirms identification and approves PT to assess internal pelvic floor and treatment Yes  PELVIC MMT:   MMT eval  Internal Anal Sphincter 3/5   External Anal Sphincter 3/5  Puborectalis 3/5  (Blank rows = not tested)        TONE: increased  PROLAPSE: Posterior wall weakness  TODAY'S TREATMENT:      08/08/23 Neuromuscular re-education: Down training: Diaphragmatic breathing with tactile cues to the lower rib cage to assist with expansion and relaxation of the pelvic floor Exercises: Stretches/mobility: Hamstring stretch in supine holding 30 sec Piriformis stretch holding 30 sec bil. In supine Pull knee over trunk holding 30 sec bil.  Strengthening: Hip flexion isometric with abdominal contraction 10 x each leg holding 5 sec Bridges 15 x Therapeutic activities: Functional strengthening activities: Therapist educated patient on how to sit on the commode with feet on stool to elevate her knees, breathing to increase pressure to push the stool out. She was able to demonstrate but needed verbal and tactile cues to perform diaphragmatic breathing.  Therapist has educated patient on how to lay  down with legs up for 10 minutes to reduce her prolapse when she is feeling it.   PATIENT EDUCATION: 08/08/23 Education details: Access Code: [email or web address]; education on how to breath correctly to relax pelvic floor for a bowel movement;  Person educated: Patient, Child(ren), and interpreter Education method: Explanation, Demonstration, Tactile cues, Verbal cues, and Handouts Education comprehension: verbalized understanding, returned demonstration, verbal cues required, tactile cues required, and needs further education  HOME EXERCISE PROGRAM: 08/08/23 Access Code: [email or web address] URL: [email or web address] Date: 08/08/2023 Prepared by: Channing Pereyra  Exercises - Hooklying Isometric Hip Flexion  - 1 x daily - 7 x weekly - 1 sets - 10 reps - 5 sec hold - Supine Bridge  - 1 x daily - 7 x weekly - 1 sets - 15 reps - Supine Hamstring Stretch  - 1 x daily - 7 x weekly - 1 sets - 1 reps - 30 sec hold - Supine Figure 4 Piriformis  Stretch with Leg Extension  - 1 x daily - 7 x weekly - 1 sets - 1 reps - 30 sec hold - Supine Piriformis Stretch with Leg Straight  - 1 x daily - 7 x weekly - 1 sets - 1 reps - 30 sec hold  ASSESSMENT:  CLINICAL IMPRESSION: Patient is a 53 y.o. female who was seen today for physical therapy  treatment for nocturia and posterior prolapse. Interpreter present during the treatment. Patient only wants to come to this appointment and wait to see MD before she comes for more appointments. She has learned how to have a bowel movement to reduce the strain on her prolapse. She has learned how to lay down to reduce her prolapse.  Patient will benefit from skilled therapy to work on pelvic floor coordination, educated ways to not strain to have a bowel movement to reduce further progression of the posterior wall prolapse.   OBJECTIVE IMPAIRMENTS: decreased coordination and decreased strength.   ACTIVITY LIMITATIONS: toileting  PARTICIPATION LIMITATIONS: community activity  PERSONAL FACTORS: Time since onset of injury/illness/exacerbation are also affecting patient's functional outcome.   REHAB POTENTIAL: Good  CLINICAL DECISION MAKING: Stable/uncomplicated  EVALUATION COMPLEXITY: Low   GOALS: Goals reviewed with patient? Yes  SHORT TERM GOALS: Target date: 08/29/23  Patient independent with flexibility exercises.  Baseline: not educated yet Goal status: Met 08/08/23    LONG TERM GOALS: Target date: 01/29/24  Patient is independent with advanced HEP for core and pelvic floor.  Baseline: not educated yet.  Goal status: INITIAL  2.  Patient is able to elongate her rectum when bearing down and not contract the muscles so she is able to have a bowel movement without straining 75% of the time.  Baseline: She contracts her rectum when trying to push a bowel movement out.  Goal status: INITIAL  3.  Patient is able to perform diaphragmatic breathing to elongate the pelvic floor and the therapist  can feel the pelvic floor drop.  Baseline: not able to perform diaphragmatic breathing.  Goal status: INITIAL  4.  Patient understands ways to manage her posterior wall prolapse to reduce the risk of further progression.  Baseline: not educated yet Goal status: INITIAL   PLAN:  PT FREQUENCY: 1x/week  PT DURATION: 6 months  PLANNED INTERVENTIONS: 97110-Therapeutic exercises, 97530- Therapeutic activity, 97112- Neuromuscular re-education, 97140- Manual therapy, Patient/Family education, Dry Needling, Spinal mobilization, Moist heat, and Biofeedback  PLAN FOR NEXT SESSION: See what MD says; review toileting and diaphragmatic breathing; prolapse management with lifting   Channing Pereyra, PT  08/08/23 10:26 AM  PHYSICAL THERAPY DISCHARGE SUMMARY  Visits from Start of Care: 2  Current functional level related to goals / functional outcomes: See above. Patient has not returned since her last visit on 08/08/23.    Remaining deficits: See above.    Education / Equipment: HEP   Patient agrees to discharge. Patient goals were not met. Patient is being discharged due to not returning since the last visit. Thank you for the referral.   Channing Pereyra, PT 01/29/24 9:04 AM

## 2023-08-08 NOTE — Patient Instructions (Signed)
How To Poop Better:  What are Good Poops? There is no one exact normal, but they should be REGULAR.  This varies from person to person and ranges from up to 3x/day or as little as 3-4/week.  This should stay consistent for you.  They should be formed and ideally one solid mass that doesn't fall apart or dissolve in the water and is brown in color.  Lifestyle Tips:  Fiber: Eat 25-31 grams per day Do not hold it.  If you need to go, GO! Try to go every day around the same time Walk and move more Probiotics for more healthy gut bacteria Water and fluids: half of your healthy body weight in ounces  Toileting Tips:  Posture: knees above hips, back flat, look straight ahead, RELAX Relax all the muscles from your face down to your toes Breathe: slow deep breaths into your belly and pelvic floor is RELAXED Blow: Tighten belly and blow like blowing up a balloon, make "SH" sound, make a vowel sound with a deep voice Do NOT sit more than 10 minutes After you are finished, tighten the muscles to reset pelvic floor back to normal      Eulis Foster, PT [facility] [address], [address] [place], [place] [address] W: [phone] [email or web address]   About Rectocele Overview A rectocele is a type of hernia which causes different degrees of bulging of the rectal tissues into the vaginal wall. You may even notice that it presses against the vaginal wall so much that some vaginal tissues droop outside of the opening of your vagina.  Causes of Rectocele The most common cause is childbirth. The muscles and ligaments in the pelvis that hold up and support the female organs and vagina become stretched and weakened during labor and delivery. The more babies you have, the more the support tissues are stretched and weakened. Not everyone who has a baby will develop a rectocele. Some women have stronger supporting tissue in the pelvis and may not have as much of a problem as  others. Women who have a Cesarean section usually do not get rectoceles unless they pushed a long time prior to the cesarean delivery.  Other conditions that can cause a rectocele include chronic constipation, a chronic cough, a lot of heavy lifting, and obesity. Older women may have this problem because the loss of female hormones causes the vaginal tissue to become weaker.  Symptoms There may not be any symptoms. If you do have symptoms, they may include:  pelvic pressure in the rectal area  protrusion of the lower part of the vagina through the opening of the vagina  constipation and trapping of the stool, making it difficult to have a bowel movement. In severe cases, you may have to press on the lower part of your vagina to help push the stool out of your rectum.  This is called splinting to empty. Diagnosing Rectocele Your health care provider will ask about your symptoms and perform a pelvic exam. S/he will ask you to bear down, pushing like you are having a bowel movement so as to see how far the lower part of the vagina protrudes into the vagina and possibly outside of the vagina. Your provider will also ask you to contract the muscles of your pelvis (like you are stopping the stream in the middle of urinating) to determine the strength of your pelvic muscles. Your provider may also do a rectal exam.    Treatment Options If you do  not have any symptoms, no treatment may be necessary. Other treatment options include:  Pelvic floor exercises: Contracting the muscles in your genital area may help strengthen your muscles and support the organs.  Be sure to get proper exercise instruction from your physical therapist. A pessary (removable pelvic support device) sometimes helps rectocele symptoms. Surgery: Surgical repair may be necessary. In some cases the uterus may need to be taken out (a hysterectomy) as well.  There are many types of surgery for pelvic support problems.  Look for physicians who  specialize in repair procedures. You can take care of yourself by:  treating and preventing constipation  avoiding heavy lifting, and lifting correctly (with your legs, not with your waist or back)  treating a chronic cough or bronchitis  not smoking  avoiding too much weight gain  doing pelvic floor exercises      The first picture shows that there is no effect on the pelvic floor with gravity eliminated. The next three show that with a wedge pillow or a few pillows from home under your pelvis the pelvic floor is inverted and may relax and allows gravity to help return prolapsed areas more inward to help relieve symptoms. Do this 15-20 mins every evening when symptoms tend to be worse. Stop if you have pain or negative symptoms.

## 2023-08-09 ENCOUNTER — Encounter: Payer: Self-pay | Admitting: Student

## 2023-08-09 ENCOUNTER — Ambulatory Visit: Payer: No Typology Code available for payment source | Admitting: Student

## 2023-08-09 ENCOUNTER — Other Ambulatory Visit (HOSPITAL_COMMUNITY): Payer: Self-pay

## 2023-08-09 VITALS — BP 118/56 | HR 70 | Ht 59.0 in | Wt 143.2 lb

## 2023-08-09 DIAGNOSIS — E119 Type 2 diabetes mellitus without complications: Secondary | ICD-10-CM

## 2023-08-09 DIAGNOSIS — R200 Anesthesia of skin: Secondary | ICD-10-CM

## 2023-08-09 DIAGNOSIS — R202 Paresthesia of skin: Secondary | ICD-10-CM

## 2023-08-09 MED ORDER — GABAPENTIN 100 MG PO CAPS
100.0000 mg | ORAL_CAPSULE | Freq: Three times a day (TID) | ORAL | 1 refills | Status: DC
  Filled 2023-08-09 (×2): qty 90, 30d supply, fill #0

## 2023-08-09 NOTE — Progress Notes (Signed)
  SUBJECTIVE:   CHIEF COMPLAINT / HPI:   F/u DM  Meds:  Metformin 500 mg daily Lab Results  Component Value Date   HGBA1C 6.6 (A) 05/10/2023    Numbness and tingling in legs? RPR normal, ABI normal  Today: taking Wyvonnia Dusky, that helps a little. Medication is in spanish, and says take one daily. Appears to be shark cartilage? Notes tingling sensation when walking, better when resting for 10-15 min. Gets fatigue and tiredness sensation in her legs. Symptoms have been an issue for last 6 months, but seem to be getting better with the supplement pills.    PERTINENT  PMH / PSH:    OBJECTIVE:  BP (!) 118/56   Pulse 70   Ht 4\' 11"  (1.499 m)   Wt 143 lb 3.2 oz (65 kg)   SpO2 100%   BMI 28.92 kg/m  Physical Exam Musculoskeletal:     Right lower leg: Tenderness present. No swelling, deformity or bony tenderness. No edema.     Left lower leg: Tenderness present. No swelling or bony tenderness. No edema.     Right ankle: Normal.     Left ankle: Normal.     Right foot: Normal range of motion. No tenderness or bony tenderness.     Left foot: Normal range of motion. No tenderness or bony tenderness.      ASSESSMENT/PLAN:   Assessment & Plan Type 2 diabetes mellitus without complication, without long-term current use of insulin (HCC) Patient here for follow-up diabetes, he is still taking metformin daily.  Previous A1c well-controlled will check A1c. - A1c - Continue metformin 500 mg daily - Follow-up 6 months Numbness and tingling of both legs Patient comes for follow-up of numbness and tingling, bilaterally, present for 6 months.  Patient appreciates has been getting better, as she has been taking cartilage supplements, that are in [place].  Patient had negative RPR, B12, regular ABI, making infection/vitamin deficiency/claudication less likely.  Given patient has diabetes, though well-controlled, and normal monofilament testing, will treat for peripheral neuropathy.  Will  also test TSH and CMP, to evaluate for thyroid causes, and effect of supplements. - Gabapentin 100 mg 3 times daily - TSH - CMP - Follow-up 1 month No follow-ups on file. Bess Kinds, MD 08/09/2023, 9:24 AM PGY-3, [facility]

## 2023-08-09 NOTE — Assessment & Plan Note (Addendum)
Patient comes for follow-up of numbness and tingling, bilaterally, present for 6 months.  Patient appreciates has been getting better, as she has been taking cartilage supplements, that are in [place].  Patient had negative RPR, B12, regular ABI, making infection/vitamin deficiency/claudication less likely.  Given patient has diabetes, though well-controlled, and normal monofilament testing, will treat for peripheral neuropathy.  Will also test TSH and CMP, to evaluate for thyroid causes, and effect of supplements. - Gabapentin 100 mg 3 times daily - TSH - CMP - Follow-up 1 month

## 2023-08-09 NOTE — Assessment & Plan Note (Addendum)
Patient here for follow-up diabetes, he is still taking metformin daily.  Previous A1c well-controlled will check A1c. - A1c - Continue metformin 500 mg daily - Follow-up 6 months

## 2023-08-09 NOTE — Patient Instructions (Addendum)
It was great to see you! Thank you for allowing me to participate in your care!  I recommend that you always bring your medications to each appointment as this makes it easy to ensure we are on the correct medications and helps [place] not miss when refills are needed.  Our plans for today:  - Diabetes  We are checking your A1c today to see how your diabetes is doing.   Continue your metformin, daily  Follow up in 6 months     - Numbness and tingling  We are starting a medication to see if this helps with your symptoms  Take 100 mg once the first day  Take 100 mg twice a day, for the second day  Take 100 mg three times a day, starting third day  Also checking labs to be sure supplements not causing harm, and that thyroid is not playing a role.    Follow up in 2-4 weeks as needed  We are checking some labs today, I will call you if they are abnormal will send you a MyChart message or a letter if they are normal.  If you do not hear about your labs in the next 2 weeks please let us know.  Take care and seek immediate care sooner if you develop any concerns.   Dr. Bess Kinds, MD [facility]

## 2023-08-10 ENCOUNTER — Encounter: Payer: Self-pay | Admitting: Student

## 2023-08-10 LAB — COMPREHENSIVE METABOLIC PANEL
ALT: 19 [IU]/L (ref 0–32)
AST: 17 [IU]/L (ref 0–40)
Albumin: 4.5 g/dL (ref 3.8–4.9)
Alkaline Phosphatase: 94 [IU]/L (ref 44–121)
BUN/Creatinine Ratio: 24 — ABNORMAL HIGH (ref 9–23)
BUN: 18 mg/dL (ref 6–24)
Bilirubin Total: 0.4 mg/dL (ref 0.0–1.2)
CO2: 23 mmol/L (ref 20–29)
Calcium: 9.7 mg/dL (ref 8.7–10.2)
Chloride: 102 mmol/L (ref 96–106)
Creatinine, Ser: 0.76 mg/dL (ref 0.57–1.00)
Globulin, Total: 2.7 g/dL (ref 1.5–4.5)
Glucose: 118 mg/dL — ABNORMAL HIGH (ref 70–99)
Potassium: 4.3 mmol/L (ref 3.5–5.2)
Sodium: 141 mmol/L (ref 134–144)
Total Protein: 7.2 g/dL (ref 6.0–8.5)
eGFR: 94 mL/min/{1.73_m2} (ref >59)

## 2023-08-10 LAB — HEMOGLOBIN A1C
Est. average glucose Bld gHb Est-mCnc: 143 mg/dL
Hgb A1c MFr Bld: 6.6 % — ABNORMAL HIGH (ref 4.8–5.6)

## 2023-08-10 LAB — TSH RFX ON ABNORMAL TO FREE T4: TSH: 1.02 u[IU]/mL (ref 0.450–4.500)

## 2023-08-15 ENCOUNTER — Encounter: Payer: 59 | Admitting: Physical Therapy

## 2023-08-22 ENCOUNTER — Encounter: Payer: 59 | Admitting: Physical Therapy

## 2023-08-22 ENCOUNTER — Other Ambulatory Visit (HOSPITAL_COMMUNITY): Payer: Self-pay

## 2023-08-28 ENCOUNTER — Telehealth: Payer: Self-pay

## 2023-08-28 ENCOUNTER — Encounter: Payer: Self-pay | Admitting: Obstetrics and Gynecology

## 2023-08-28 ENCOUNTER — Ambulatory Visit (INDEPENDENT_AMBULATORY_CARE_PROVIDER_SITE_OTHER): Payer: No Typology Code available for payment source | Admitting: Obstetrics and Gynecology

## 2023-08-28 VITALS — BP 131/81 | HR 89

## 2023-08-28 DIAGNOSIS — N812 Incomplete uterovaginal prolapse: Secondary | ICD-10-CM

## 2023-08-28 DIAGNOSIS — N816 Rectocele: Secondary | ICD-10-CM

## 2023-08-28 NOTE — Telephone Encounter (Signed)
 Patient called and states she had a #1 ring with support pessary placed this morning had a BM this afternoon and her pessary fell out. She couldn't put it back in on her own. Advised to clean and bring back to her appointment on 09-10-2022 for further pessary work up.

## 2023-08-28 NOTE — Patient Instructions (Signed)
 To help with bowel movements:   Dietary changes (more leafy greens, vegetables and fruits; less processed foods) Fiber supplementation (Benefiber, FiberCon, Metamucil or Psyllium). Start slow and increase gradually to full dose. Over-the-counter agents such as: stool softeners (Docusate or Colace) and/or laxatives (Miralax , milk of magnesia)  "Power Pudding" is a natural mixture that may help your constipation.  To make blend 1 cup applesauce, 1 cup wheat bran, and 3/4 cup prune juice, refrigerate and then take 1 tablespoon daily with a large glass of water as needed.

## 2023-08-28 NOTE — Progress Notes (Signed)
 [facility]   Subjective:     Chief Complaint: Follow-up (Lori Sullivan is a 53 y.o. female here for a f/u. )  History of Present Illness: Lori Sullivan is a 53 y.o. female with stage II pelvic organ prolapse who presents for follow up. She feels it most when she has to have a bowel movement. She tried a few visits with PT but did not feel it was helpful. She has been eating dragonfruit in order to have a BM. Has a BM every day but she still has some hard stools initially then soft behind it.    Past Medical History: Patient  has a past medical history of Anemia, Constipation, Diabetes mellitus without complication (HCC), Fatigue, GERD (gastroesophageal reflux disease), History of 2019 novel coronavirus disease (COVID-19) ((08-13-2019  per pt only mild cough/ sore throat and resolved in 2 wks, no further symptoms), Hypertension (08-13-2019  in epic ED visit 07-14-2019 pt given norvasc  rx for HTN, per note she was to follow up with her pcp which she has not done due bleeding hemorrhoids,  pt stated last dose approx. 2 wks ago), Internal hemorrhoid, bleeding, Mixed hyperlipidemia, and RECTAL BLEEDING (05/28/2010).   Past Surgical History: She  has a past surgical history that includes Colonoscopy (08-11-2019    DR HUNG  (PER DR WHITE H&P IN EPIC DATED 08-12-2019)); Hemorrhoid surgery (N/A, 09/04/2019); and Rectal exam under anesthesia (N/A, 09/04/2019).   Medications: She has a current medication list which includes the following prescription(s): amlodipine , atorvastatin , ferrous sulfate , gabapentin , ibuprofen , metformin , and omeprazole .   Allergies: Patient has no known allergies.   Social History: Patient  reports that she has never smoked. She has never been exposed to tobacco smoke. She has never used smokeless tobacco. She reports that she does not drink alcohol and does not use drugs.      Objective:    BP 131/81   Pulse 89  Gen: No apparent distress, A&O x 3. Pelvic Exam:  Normal external female genitalia; Bartholin's and Skene's glands normal in appearance; urethral meatus normal in appearance, no urethral masses or discharge.   A size #1 ring with support pessary was fitted. It was comfortable, stayed in place with valsalva and voiding and was an appropriate size on examination, with one finger fitting between the pessary and the vaginal walls. Lot [id number]  POP-Q (05/02/23):    POP-Q   -2                                            Aa   -2                                           Ba   -8                                              C    2                                            Gh  3.5                                            Pb   8                                            tvl    -1                                            Ap   -1                                            Bp   -7.5                                              D         Assessment/Plan:    Assessment: Lori Sullivan is a 53 y.o. with stage II pelvic organ prolapse   Plan: - She was fitted with a #1 ring with support pessary. She will keep the pessary in place until next visit.  - Also discussed options of Posterior repair and reviewed risks/ benefits of surgery along with success rate of around 70-80%. We discussed the importance of a good bowel regimen perioperatively and beyond to decrease the risk of recurrence. IUGA posterior repair handout provided in English (daughter is able to interpret).  - Recommended daily fiber supplementation to help keep bowel movements soft and prevent straining.   Follow-up in 2-3 weeks for a pessary check or sooner as needed.     Arma Lamp, MD  Time spent: I spent 30 minutes dedicated to the care of this patient on the date of this encounter to include pre-visit review of records, face-to-face time with the patient discussing options and post visit documentation. Additional time was spent on the pessary fitting.

## 2023-09-07 ENCOUNTER — Ambulatory Visit: Payer: No Typology Code available for payment source | Admitting: Student

## 2023-09-11 ENCOUNTER — Ambulatory Visit: Payer: No Typology Code available for payment source | Admitting: Obstetrics and Gynecology

## 2023-09-18 ENCOUNTER — Ambulatory Visit (INDEPENDENT_AMBULATORY_CARE_PROVIDER_SITE_OTHER): Payer: No Typology Code available for payment source | Admitting: Obstetrics

## 2023-09-18 VITALS — BP 104/68 | HR 76

## 2023-09-18 DIAGNOSIS — R351 Nocturia: Secondary | ICD-10-CM | POA: Diagnosis not present

## 2023-09-18 DIAGNOSIS — K5904 Chronic idiopathic constipation: Secondary | ICD-10-CM | POA: Insufficient documentation

## 2023-09-18 DIAGNOSIS — N816 Rectocele: Secondary | ICD-10-CM

## 2023-09-18 DIAGNOSIS — M5431 Sciatica, right side: Secondary | ICD-10-CM | POA: Diagnosis not present

## 2023-09-18 DIAGNOSIS — M5432 Sciatica, left side: Secondary | ICD-10-CM

## 2023-09-18 NOTE — Assessment & Plan Note (Addendum)
-   For treatment of pelvic organ prolapse, we discussed options for management including expectant management, conservative management, and surgical management, such as Kegels, a pessary, pelvic floor physical therapy, and specific surgical procedures. - started miralax for constipation with relief, encouraged to continue optimization with adjustment of  miralax/fiber supplementation dosing - failed size 1 ring with support pessary due to expulsion, declines refitting today due to relief after optimization of stool consistency. - prefers to continue pelvic floor exercises and return for reassessment in 3 months

## 2023-09-18 NOTE — Progress Notes (Signed)
 [facility] Return Visit Interpreter # Lori Sullivan [phone] SUBJECTIVE  History of Present Illness: Lori Sullivan is a 53 y.o. female seen in follow-up for pessary check. Plan at last visit was trial of size 1 ring with support pessary.   Reports expulsion during void 1 day after placement. Unsure if she want to repeat pessary trial Prefers to do exercises  Denies relief with urinary frequency when pessary was in place. Started miralax for bowel movements nightly 1 teaspoon with relief, massages her lower abdomnen and reports intermittent Bristol I-II stool. More Bristol III-IV since starting miralax with loose stool after around 1 week and stopped.  Dragonfruit for bowel movements Reports nocturia due to drinking fluids and soup from papaya leafs when she returns home from work and uses miralax at night. Symptoms since she increased fluid intake at night around 2 months ago Reports numbness in LE bilaterally. HbA1C 6.6 on 08/09/23 Reports sitting at work and increased LE numbness and low back pain.   Past Medical History: Patient  has a past medical history of Anemia, Constipation, Diabetes mellitus without complication (HCC), Fatigue, GERD (gastroesophageal reflux disease), History of 2019 novel coronavirus disease (COVID-19) ((08-13-2019  per pt only mild cough/ sore throat and resolved in 2 wks, no further symptoms), Hypertension (08-13-2019  in epic ED visit 07-14-2019 pt given norvasc rx for HTN, per note she was to follow up with her pcp which she has not done due bleeding hemorrhoids,  pt stated last dose approx. 2 wks ago), Internal hemorrhoid, bleeding, Mixed hyperlipidemia, and RECTAL BLEEDING (05/28/2010).   Past Surgical History: She  has a past surgical history that includes Colonoscopy (08-11-2019    DR HUNG  (PER DR WHITE H&P IN EPIC DATED 08-12-2019)); Hemorrhoid surgery (N/A, 09/04/2019); and Rectal exam under anesthesia (N/A, 09/04/2019).   Medications: She has a current  medication list which includes the following prescription(s): amlodipine, atorvastatin, ferrous sulfate, gabapentin, ibuprofen, metformin, and omeprazole.   Allergies: Patient has no known allergies.   Social History: Patient  reports that she has never smoked. She has never been exposed to tobacco smoke. She has never used smokeless tobacco. She reports that she does not drink alcohol and does not use drugs.     OBJECTIVE     Physical Exam: Vitals:   09/18/23 0933  BP: 104/68  Pulse: 76   Gen: No apparent distress, A&O x 3.  Detailed Urogynecologic Evaluation:  Deferred.       No data to display             ASSESSMENT AND PLAN    [name] is a 53 y.o. with:  1. Prolapse of posterior vaginal wall   2. Chronic idiopathic constipation   3. Nocturia   4. Bilateral sciatica     Prolapse of posterior vaginal wall Assessment & Plan: - For treatment of pelvic organ prolapse, we discussed options for management including expectant management, conservative management, and surgical management, such as Kegels, a pessary, pelvic floor physical therapy, and specific surgical procedures. - started miralax for constipation with relief, encouraged to continue optimization with adjustment of  miralax/fiber supplementation dosing - failed size 1 ring with support pessary due to expulsion, declines refitting today due to relief after optimization of stool consistency. - prefers to continue pelvic floor exercises and return for reassessment in 3 months   Chronic idiopathic constipation Assessment & Plan: - started miralax 1 teaspoon nightly with relief, reports Bristol I-II stool improved with Miralax to III-IV, however  loose stool after around 1 week. Reports massaging lower abdomen and squatting position.  Reviewed Bristol stool score III-V as goal with titration of miralax or fiber supplementation. Reviewed squatting position and discouraged valsalva - improved vaginal bulge  symptoms - For constipation, we reviewed the importance of a better bowel regimen.  We also discussed the importance of avoiding chronic straining, as it can exacerbate her pelvic floor symptoms; we discussed treating constipation and straining prior to surgery, as postoperative straining can lead to damage to the repair and recurrence of symptoms. We discussed initiating therapy with increasing fluid intake, fiber supplementation, stool softeners, and laxatives such as miralax.     Nocturia Assessment & Plan: For night time frequency: - avoid fluid intake 3 hours before bedtime - denies LE edema or snoring - encouraged to monitor glycemic control    Bilateral sciatica Assessment & Plan: - discussed PT and changing position during working to avoid prolonged sitting.  - encouraged to monitor glycemic control due to association of DM neuropathy with bladder and bowel symptoms   Time spent: I spent 55 minutes dedicated to the care of this patient on the date of this encounter to include pre-visit review of records, face-to-face time with the patient discussing pelvic organ prolapse, constipation, nocturia, bilateral sciatica, and post visit documentation and ordering medication/ testing.   Loleta Chance, MD

## 2023-09-18 NOTE — Patient Instructions (Signed)
 Continues titration of miralax to optimize stool consistency.   Continue pelvic floor Kegel exercises.   Please call if you experience worsening vaginal or urinary symptoms.   Shift fluid intake earlier in the night   For night time frequency: - avoid fluid intake 3 hours before bedtime

## 2023-09-18 NOTE — Assessment & Plan Note (Addendum)
 For night time frequency: - avoid fluid intake 3 hours before bedtime - denies LE edema or snoring - encouraged to monitor glycemic control

## 2023-09-18 NOTE — Assessment & Plan Note (Signed)
-   discussed PT and changing position during working to avoid prolonged sitting.  - encouraged to monitor glycemic control due to association of DM neuropathy with bladder and bowel symptoms

## 2023-09-18 NOTE — Assessment & Plan Note (Addendum)
-   started miralax 1 teaspoon nightly with relief, reports Bristol I-II stool improved with Miralax to III-IV, however loose stool after around 1 week. Reports massaging lower abdomen and squatting position.  Reviewed Bristol stool score III-V as goal with titration of miralax or fiber supplementation. Reviewed squatting position and discouraged valsalva - improved vaginal bulge symptoms - For constipation, we reviewed the importance of a better bowel regimen.  We also discussed the importance of avoiding chronic straining, as it can exacerbate her pelvic floor symptoms; we discussed treating constipation and straining prior to surgery, as postoperative straining can lead to damage to the repair and recurrence of symptoms. We discussed initiating therapy with increasing fluid intake, fiber supplementation, stool softeners, and laxatives such as miralax.

## 2023-09-25 ENCOUNTER — Other Ambulatory Visit: Payer: Self-pay | Admitting: Student

## 2023-09-25 ENCOUNTER — Other Ambulatory Visit (HOSPITAL_COMMUNITY): Payer: Self-pay

## 2023-09-25 ENCOUNTER — Other Ambulatory Visit: Payer: Self-pay

## 2023-09-25 DIAGNOSIS — E119 Type 2 diabetes mellitus without complications: Secondary | ICD-10-CM

## 2023-09-25 DIAGNOSIS — E782 Mixed hyperlipidemia: Secondary | ICD-10-CM

## 2023-09-25 MED ORDER — METFORMIN HCL ER 500 MG PO TB24
500.0000 mg | ORAL_TABLET | Freq: Every day | ORAL | 4 refills | Status: DC
  Filled 2023-09-25: qty 30, 30d supply, fill #0
  Filled 2023-11-08: qty 30, 30d supply, fill #1

## 2023-09-25 MED ORDER — ATORVASTATIN CALCIUM 80 MG PO TABS
80.0000 mg | ORAL_TABLET | Freq: Every day | ORAL | 4 refills | Status: AC
  Filled 2023-09-25: qty 30, 30d supply, fill #0
  Filled 2023-11-08: qty 30, 30d supply, fill #1
  Filled 2024-01-23: qty 30, 30d supply, fill #2
  Filled 2024-03-04: qty 30, 30d supply, fill #3
  Filled 2024-04-30: qty 30, 30d supply, fill #4

## 2023-11-08 ENCOUNTER — Other Ambulatory Visit (HOSPITAL_COMMUNITY): Payer: Self-pay

## 2023-11-10 ENCOUNTER — Other Ambulatory Visit (HOSPITAL_COMMUNITY): Payer: Self-pay

## 2023-12-05 ENCOUNTER — Encounter: Payer: Self-pay | Admitting: Student

## 2023-12-05 ENCOUNTER — Ambulatory Visit: Payer: Self-pay | Admitting: Student

## 2023-12-05 ENCOUNTER — Other Ambulatory Visit (HOSPITAL_COMMUNITY): Payer: Self-pay

## 2023-12-05 ENCOUNTER — Ambulatory Visit (INDEPENDENT_AMBULATORY_CARE_PROVIDER_SITE_OTHER): Admitting: Student

## 2023-12-05 VITALS — BP 120/60 | HR 71 | Ht 59.0 in | Wt 143.1 lb

## 2023-12-05 DIAGNOSIS — Z23 Encounter for immunization: Secondary | ICD-10-CM | POA: Diagnosis not present

## 2023-12-05 DIAGNOSIS — K219 Gastro-esophageal reflux disease without esophagitis: Secondary | ICD-10-CM

## 2023-12-05 DIAGNOSIS — E119 Type 2 diabetes mellitus without complications: Secondary | ICD-10-CM | POA: Diagnosis not present

## 2023-12-05 DIAGNOSIS — G8929 Other chronic pain: Secondary | ICD-10-CM

## 2023-12-05 DIAGNOSIS — R519 Headache, unspecified: Secondary | ICD-10-CM | POA: Insufficient documentation

## 2023-12-05 LAB — POCT GLYCOSYLATED HEMOGLOBIN (HGB A1C): HbA1c, POC (controlled diabetic range): 6.9 % (ref 0.0–7.0)

## 2023-12-05 MED ORDER — OMEPRAZOLE 40 MG PO CPDR
40.0000 mg | DELAYED_RELEASE_CAPSULE | Freq: Every day | ORAL | 3 refills | Status: DC
  Filled 2023-12-05: qty 30, 30d supply, fill #0
  Filled 2024-01-23: qty 30, 30d supply, fill #1
  Filled 2024-03-04: qty 30, 30d supply, fill #2
  Filled 2024-04-30: qty 30, 30d supply, fill #3

## 2023-12-05 MED ORDER — ACETAMINOPHEN 500 MG PO TABS
500.0000 mg | ORAL_TABLET | Freq: Four times a day (QID) | ORAL | 1 refills | Status: AC | PRN
  Filled 2023-12-05: qty 30, 8d supply, fill #0

## 2023-12-05 MED ORDER — METFORMIN HCL ER 500 MG PO TB24
500.0000 mg | ORAL_TABLET | Freq: Every day | ORAL | 2 refills | Status: DC
  Filled 2023-12-05: qty 60, 60d supply, fill #0
  Filled 2024-03-04: qty 60, 60d supply, fill #1
  Filled 2024-04-30: qty 60, 60d supply, fill #2

## 2023-12-05 NOTE — Progress Notes (Signed)
  SUBJECTIVE:   CHIEF COMPLAINT / HPI:   DM2 Meds: Metformin  500 mg daily Lab Results  Component Value Date   HGBA1C 6.9 12/05/2023   HA For a few weeks, pain rated 5/10, sometimes is better or worse, and sometimes has ringing in the ear. Pain located on left side of head. Not responsive to light or sounds, not pulsating.   Stomach Been an issue for 2 weeks, feels a burning and has reflux at times. Burping helps make it better. Used to take Prilosec and it would help, but has been out.   PERTINENT  PMH / PSH:   OBJECTIVE:  BP (!) 124/57   Pulse 71   Ht 4\' 11"  (1.499 m)   Wt 143 lb 2 oz (64.9 kg)   SpO2 100%   BMI 28.91 kg/m  Physical Exam Abdominal:     General: There is no distension.     Palpations: Abdomen is soft.     Tenderness: There is no abdominal tenderness.  Neurological:     Mental Status: She is alert. Mental status is at baseline.     Cranial Nerves: Cranial nerves 2-12 are intact. No cranial nerve deficit, dysarthria or facial asymmetry.     Sensory: Sensation is intact. No sensory deficit.     Motor: Motor function is intact. No weakness, tremor or abnormal muscle tone.     Gait: Gait is intact.      ASSESSMENT/PLAN:   Assessment & Plan Type 2 diabetes mellitus without complication, without long-term current use of insulin (HCC) Patient comes in for follow-up of her diabetes.  Patient reports good compliance with metformin .  Patient A1c slightly up at 6.9 today previously 6.6.  Will recommend patient take metformin  twice a day. - Metformin  500 mg twice daily - Follow-up 3 months Gastroesophageal reflux disease without esophagitis Patient comes in for follow-up of her GERD symptoms.  Patient reports she has been having reflux, burning sensation in her stomach, and about her throat, also refluxing acid, more recently.  Patient was on Prilosec, but has not had a refill of this medication, but said it did help when she was taking it.  Will refill  Prilosec. - Prilosec 40 mg daily, x 2 to 3 months - Follow-up 2 to 3 months Chronic nonintractable headache, unspecified headache type Patient comes in with complaint of headache, rated 5 out of 10 for pain, this been an issue for the last few weeks.  Patient appreciates sometimes the headaches goes away, then comes back.  Patient reports headache not light sensitive for sound sensitive, not pulsating in nature.  Patient is never tried taking any medication to help.  Patient neuroexam benign low concern for CVA.  Suspect patient having tension type headache, not migraine.  Will recommend Tylenol  as needed. - Tylenol  500 mg every 6 hours No follow-ups on file. Wilhemena Harbour, MD 12/05/2023, 4:17 PM PGY-3, [facility]

## 2023-12-05 NOTE — Patient Instructions (Signed)
 It was great to see you! Thank you for allowing me to participate in your care!  I recommend that you always bring your medications to each appointment as this makes it easy to ensure we are on the correct medications and helps us  not miss when refills are needed.  Our plans for today:  - Headache  Looks like a regular "tension headache" Take Tylenol  500 mg every 6 hours as needed for pain  - Diabetes  Take Metformin  500 mg twice a day  Make follow up appointment in 3 months  - GERD (Acid Reflux)  Take Prilosec 40 mg, daily for 2-3 months  Make follow up appointment in 3 months   Take care and seek immediate care sooner if you develop any concerns.   Dr. Wilhemena Harbour, MD [facility]

## 2023-12-05 NOTE — Assessment & Plan Note (Addendum)
 Patient comes in for follow-up of her diabetes.  Patient reports good compliance with metformin .  Patient A1c slightly up at 6.9 today previously 6.6.  Will recommend patient take metformin  twice a day. - Metformin  500 mg twice daily - Follow-up 3 months

## 2023-12-05 NOTE — Assessment & Plan Note (Addendum)
 Patient comes in with complaint of headache, rated 5 out of 10 for pain, this been an issue for the last few weeks.  Patient appreciates sometimes the headaches goes away, then comes back.  Patient reports headache not light sensitive for sound sensitive, not pulsating in nature.  Patient is never tried taking any medication to help.  Patient neuroexam benign low concern for CVA.  Suspect patient having tension type headache, not migraine.  Will recommend Tylenol  as needed. - Tylenol  500 mg every 6 hours

## 2023-12-05 NOTE — Assessment & Plan Note (Addendum)
 Patient comes in for follow-up of her GERD symptoms.  Patient reports she has been having reflux, burning sensation in her stomach, and about her throat, also refluxing acid, more recently.  Patient was on Prilosec, but has not had a refill of this medication, but said it did help when she was taking it.  Will refill Prilosec. - Prilosec 40 mg daily, x 2 to 3 months - Follow-up 2 to 3 months

## 2023-12-26 ENCOUNTER — Ambulatory Visit (INDEPENDENT_AMBULATORY_CARE_PROVIDER_SITE_OTHER): Admitting: Obstetrics and Gynecology

## 2023-12-26 ENCOUNTER — Encounter: Payer: Self-pay | Admitting: Obstetrics and Gynecology

## 2023-12-26 ENCOUNTER — Other Ambulatory Visit (HOSPITAL_COMMUNITY): Payer: Self-pay

## 2023-12-26 ENCOUNTER — Encounter: Payer: Self-pay | Admitting: *Deleted

## 2023-12-26 VITALS — BP 124/75 | HR 79

## 2023-12-26 DIAGNOSIS — K5904 Chronic idiopathic constipation: Secondary | ICD-10-CM | POA: Diagnosis not present

## 2023-12-26 DIAGNOSIS — R351 Nocturia: Secondary | ICD-10-CM | POA: Diagnosis not present

## 2023-12-26 DIAGNOSIS — N952 Postmenopausal atrophic vaginitis: Secondary | ICD-10-CM | POA: Diagnosis not present

## 2023-12-26 DIAGNOSIS — N816 Rectocele: Secondary | ICD-10-CM

## 2023-12-26 DIAGNOSIS — F4321 Adjustment disorder with depressed mood: Secondary | ICD-10-CM

## 2023-12-26 MED ORDER — ESTRADIOL 0.1 MG/GM VA CREA
0.5000 g | TOPICAL_CREAM | VAGINAL | 11 refills | Status: DC
  Filled 2023-12-26: qty 42.5, 90d supply, fill #0

## 2023-12-26 NOTE — Progress Notes (Signed)
 [facility] Return Visit  SUBJECTIVE  History of Present Illness:  Due to language barrier, a virtual interpreter was present during the history-taking and subsequent discussion (and for part of the physical exam) with this patient. Interpretor Lori Sullivan was called at [phone] and connected with via telephone.    Lori Sullivan is a 53 y.o. female seen in follow-up for Stage II pelvic organ prolapse and oab. She presents with her daughter.   She is currently getting up 5 times at night to urinate and is continuing to do her pelvic floor exercises.   Past Medical History: Patient  has a past medical history of Anemia, Constipation, Diabetes mellitus without complication (HCC), Fatigue, GERD (gastroesophageal reflux disease), History of 2019 novel coronavirus disease (COVID-19) ((08-13-2019  per pt only mild cough/ sore throat and resolved in 2 wks, no further symptoms), Hypertension (08-13-2019  in epic ED visit 07-14-2019 pt given norvasc  rx for HTN, per note she was to follow up with her pcp which she has not done due bleeding hemorrhoids,  pt stated last dose approx. 2 wks ago), Internal hemorrhoid, bleeding, Mixed hyperlipidemia, and RECTAL BLEEDING (05/28/2010).   Past Surgical History: She  has a past surgical history that includes Colonoscopy (08-11-2019    DR HUNG  (PER DR WHITE H&P IN EPIC DATED 08-12-2019)); Hemorrhoid surgery (N/A, 09/04/2019); and Rectal exam under anesthesia (N/A, 09/04/2019).   Medications: She has a current medication list which includes the following prescription(s): [START ON 12/28/2023] estradiol, acetaminophen , amlodipine , atorvastatin , ferrous sulfate , gabapentin , ibuprofen , metformin , and omeprazole .   Allergies: Patient has no known allergies.   Social History: Patient  reports that she has never smoked. She has never been exposed to tobacco smoke. She has never used smokeless tobacco. She reports that she does not drink alcohol and does not use  drugs.     OBJECTIVE    Lab Results  Component Value Date   HGBA1C 6.9 12/05/2023   HGBA1C 6.6 (H) 08/09/2023   HGBA1C 6.6 (A) 05/10/2023     Physical Exam: Vitals:   12/26/23 0850  BP: 124/75  Pulse: 79   Gen: No apparent distress, A&O x 3.  Detailed Urogynecologic Evaluation:  Deferred.    ASSESSMENT AND PLAN    Ms. Lori Sullivan is a 53 y.o. with:  1. Vaginal atrophy   2. Nocturia   3. Chronic idiopathic constipation   4. Prolapse of posterior vaginal wall   5. Feeling grief     Vaginal atrophy -     Estradiol; Place 0.5 g vaginally 2 (two) times a week. Place 0.5g twice a week at night  Dispense: 42 g; Refill: 11  Nocturia  Patient to try pumpkin seed extract up to 5gm daily to assist in OAB control. We discussed that good control of her blood sugars is also important for her nighttime urination and that she should not drink about 2 hours prior to going to bed.   Chronic idiopathic constipation  States this is well controlled with positional change and movement.   Prolapse of posterior vaginal wall  Patient wishes to manage this expectantly with exercises and support from estrogen cream.   Feeling grief  Patient discussed that her husband has been gone for 1 year and she feels great sadness. She is very close with her daughter but does not have any hobbies or things she does outside the home to keep her going. She denied SI and deferred a referral for grief support.   Patient to follow  up in 3 months or sooner if needed.    Lori Trawick G Stelios Kirby, NP

## 2023-12-26 NOTE — Patient Instructions (Addendum)
 Pumpkin seed extract up to 5gm per day has been shown to be helpful for overactive bladder. Get just pumpkin seed extract, nothing with saw palmetto  If the pumpkin seed if not helpful for your bladder we can consider doing the medication Myrbetriq or Gemtesa for the bladder.   Use the vaginal estrogen cream twice a week in the vagina  to support the vaginal tissues and decrease risk of UTI.   Continue to try and control your blood sugars.   If you have any issues with your medications please feel free to call.

## 2024-01-23 ENCOUNTER — Other Ambulatory Visit (HOSPITAL_COMMUNITY): Payer: Self-pay

## 2024-03-04 ENCOUNTER — Other Ambulatory Visit (HOSPITAL_COMMUNITY): Payer: Self-pay

## 2024-03-12 ENCOUNTER — Ambulatory Visit (INDEPENDENT_AMBULATORY_CARE_PROVIDER_SITE_OTHER)

## 2024-03-12 VITALS — BP 134/77 | HR 72 | Ht 59.0 in | Wt 140.0 lb

## 2024-03-12 DIAGNOSIS — E119 Type 2 diabetes mellitus without complications: Secondary | ICD-10-CM

## 2024-03-12 DIAGNOSIS — G8929 Other chronic pain: Secondary | ICD-10-CM | POA: Diagnosis not present

## 2024-03-12 DIAGNOSIS — M25511 Pain in right shoulder: Secondary | ICD-10-CM

## 2024-03-12 LAB — POCT GLYCOSYLATED HEMOGLOBIN (HGB A1C): HbA1c, POC (controlled diabetic range): 6.6 % (ref 0.0–7.0)

## 2024-03-12 NOTE — Assessment & Plan Note (Addendum)
 A1c of 6.6 today. - Continue metformin  500 mg daily - Urine Alb/Cr pending

## 2024-03-12 NOTE — Patient Instructions (Addendum)
 Dear Lori Sullivan,   It was great seeing you in clinic today! You came in to check your sugar levels. It is well controlled today, keep up the good work with what you're eating and taking your medications!  You also reported some shoulder pain. I think this is due to muscle strain/fatigue, which is treated by giving the muscle rest and using heat and pain control medications. You were also taking a blue and yellow pill, which has some dangerous ingredients in it. However, it is dangerous to stop this pill suddenly, and so we will slowly decrease it over time.  There are a few things for you to do outside of clinic: - Take the blue and yellow pill every other day - Continue to use heat and ice, massage, and rest to help your shoulder pain - You can use Tylenol  and Advil  to help with the shoulder pain as well  Return on September 16th at 2:30pm for a follow-up visit; please bring all your medications with you to this visit!  Thank you for allowing me to be a part of your care team! Alan Flies, MD [facility] [address] [place], [place], [place] [address] [phone]  ....................................................................  Thn g?i Janay H Lahue,  Th?t tuy?t khi ???c g?p b?n t?i phng khm hm nay! B?n ??n ?? ki?m tra l??ng ???ng trong mu. Hm nay l??ng ???ng trong mu ? ???c ki?m sot t?t, hy ti?p t?c duy tr ch? ?? ?n u?ng v dng thu?c ?ng cch nh!  B?n c?ng bo co b? ?au vai. Ti ngh? nguyn nhn l do c?ng c?/m?t m?i c?, c th? ?i?u tr? b?ng cch cho c? ngh? ng?i v s? d?ng thu?c gi?m ?au v thu?c h? s?t. B?n c?ng ?ang u?ng m?t vin thu?c mu xanh vng, c ch?a m?t s? thnh ph?n nguy hi?m. Tuy nhin, vi?c ng?ng thu?c ??t ng?t r?t nguy hi?m, v v?y chng ti s? gi?m d?n li?u l??ng theo th?i gian.  C m?t vi vi?c b?n c th? lm ngoi phng khm: - U?ng vin thu?c mu xanh vng cch ngy - Ti?p t?c ch??m nng, ch??m ?, mt-xa v ngh? ng?i ?? gi?m ?au  vai - B?n c?ng c th? dng Tylenol  v Advil  ?? gi?m ?au vai.  Quay l?i vo lc 2:30 chi?u ngy 16 thng 9 ?? ti khm; vui lng mang theo t?t c? cc lo?i thu?c c?a b?n khi ??n khm!  C?m ?n b?n ? cho php ti tham  gia vo nhm ch?m [place] c?a b?n! Bc s? [facility] [place] [address] [place] [address] [place], [place], [place] [address] [phone]

## 2024-03-12 NOTE — Progress Notes (Signed)
    SUBJECTIVE:   CHIEF COMPLAINT / HPI:   Lori Sullivan is a 53 y.o. female presenting for A1c check. She also reports shoulder pain. Patient speaks [place]; given unavailability of interpreter, a family member (daughter) interpreted throughout visit.  T2DM Metformin  increased to twice daily in 11/2023; however, patient has been taking this daily. Sugars doing well, no reported N/V.  Shoulder Pain Has pain with lifting arms in Right shoulder for past year. Her job has a lot of manual labor in it. No numbness. Pain radiates down arm. No weakness. Helped by massage gun, massage stone. Was prescribed gabapentin  for nerve pain, but didn't help so patient discontinued this and instead started an OTC medication: Axiom blue and yellow pill: this is Lori Sullivan, which has FDA warning for unlabeled steroids (dexamethasone ) in it. She has been taking this most days for multiple months. This helps with leg pain, not with arm pain. Also taking Advil  rarely, using SalonPas. Not interested in physical therapy at this time.  Healthcare Maintenance - Diabetic eye exam - recommended - Diabetic foot exam - done, normal  Other At end of visit, daughter mentioned that patient occasionally feels like she cannot taste food; will address at follow-up.  PERTINENT  PMH / PSH: T2DM, HTN, HLD, iron deficiency anemia, sciatica  OBJECTIVE:   BP 134/77   Pulse 72   Ht 4' 11 (1.499 m)   Wt 140 lb (63.5 kg)   SpO2 99%   BMI 28.28 kg/m   MSK: Right shoulder without edema, overlying skin changes. Pain to palpation of anterior right shoulder in area of biceps tendon short head attachment. Pain to palpation of biceps muscle. Full ROM of bilateral arms with ab/adduction. Full crossover ROM of bilateral arms in front; left arm able to reach behind back, right arm limited by pain. Strength 5/5 in bilateral upper extremities. Equal sensation of bilateral upper extremities.  Diabetic Foot Exam: Normal sensation,  2+ DP pulses bilaterally   ASSESSMENT/PLAN:   Assessment & Plan Type 2 diabetes mellitus without complication, without long-term current use of insulin (HCC) A1c of 6.6 today. - Continue metformin  500 mg daily - Urine Alb/Cr pending Chronic right shoulder pain Suspect proximal biceps tendonitis/biceps strain in setting of physically demanding job given patient's description and physical exam findings. Low concern for rotator pathology. Low concern for tear given normal strength. Low concern for nerve impingement given lack of numbness/weakness. - Continue using massage, heat/SalonPas - Encouraged rest as possible, adjusting job tasks as possible - Encouraged use of Advil  and Tylenol  for pain  Patient has been regularly using Artri King medication, which contains dexamethasone ; would be dangerous to suddenly stop, and so after consulting with Lori Sullivan in pharmacy, will wean patient off this. - Decrease Lori Sullivan to one pill every other day - Address further at follow-up   Patient to follow-up in [facility]; scheduled for 9/16 at 2:30pm  Lori Flies, MD [facility]

## 2024-03-12 NOTE — Assessment & Plan Note (Signed)
 Suspect proximal biceps tendonitis/biceps strain in setting of physically demanding job given patient's description and physical exam findings. Low concern for rotator pathology. Low concern for tear given normal strength. Low concern for nerve impingement given lack of numbness/weakness. - Continue using massage, heat/SalonPas - Encouraged rest as possible, adjusting job tasks as possible - Encouraged use of Advil  and Tylenol  for pain  Patient has been regularly using Artri King medication, which contains dexamethasone ; would be dangerous to suddenly stop, and so after consulting with Dr. Koval in pharmacy, will wean patient off this. - Decrease Lori Sullivan to one pill every other day - Address further at follow-up

## 2024-03-13 LAB — MICROALBUMIN / CREATININE URINE RATIO
Creatinine, Urine: 13.9 mg/dL
Microalb/Creat Ratio: <22 mg/g{creat} (ref 0–29)
Microalbumin, Urine: <3 ug/mL

## 2024-03-14 ENCOUNTER — Ambulatory Visit: Payer: Self-pay

## 2024-03-26 ENCOUNTER — Other Ambulatory Visit (HOSPITAL_COMMUNITY)
Admission: RE | Admit: 2024-03-26 | Discharge: 2024-03-26 | Disposition: A | Discharge status: Home / Self Care | Source: Ambulatory Visit | Attending: Obstetrics and Gynecology | Admitting: Obstetrics and Gynecology

## 2024-03-26 ENCOUNTER — Other Ambulatory Visit (HOSPITAL_COMMUNITY): Payer: Self-pay

## 2024-03-26 ENCOUNTER — Encounter: Payer: Self-pay | Admitting: Obstetrics and Gynecology

## 2024-03-26 ENCOUNTER — Ambulatory Visit (INDEPENDENT_AMBULATORY_CARE_PROVIDER_SITE_OTHER): Admitting: Obstetrics and Gynecology

## 2024-03-26 VITALS — BP 132/75 | HR 76

## 2024-03-26 DIAGNOSIS — N952 Postmenopausal atrophic vaginitis: Secondary | ICD-10-CM | POA: Diagnosis not present

## 2024-03-26 DIAGNOSIS — R351 Nocturia: Secondary | ICD-10-CM | POA: Diagnosis not present

## 2024-03-26 DIAGNOSIS — N898 Other specified noninflammatory disorders of vagina: Secondary | ICD-10-CM | POA: Insufficient documentation

## 2024-03-26 DIAGNOSIS — K5904 Chronic idiopathic constipation: Secondary | ICD-10-CM | POA: Diagnosis not present

## 2024-03-26 MED ORDER — ESTRADIOL 0.1 MG/GM VA CREA
0.5000 g | TOPICAL_CREAM | VAGINAL | 11 refills | Status: AC
  Filled 2024-03-26: qty 42.5, 90d supply, fill #0

## 2024-03-26 NOTE — Patient Instructions (Signed)
 Please use estrogen cream x2 weekly  Consider adding in the pumpkin seed extract, up to 5gm per day is shown to be effective.   Please consider using the vaginal dilators or pelvic floor wand to help stretch the vaginal tissues.

## 2024-03-26 NOTE — Addendum Note (Signed)
 Addended by: Alexandria Current N on: 03/26/2024 10:13 AM   Modules accepted: Orders

## 2024-03-26 NOTE — Progress Notes (Signed)
 [facility] Return Visit  SUBJECTIVE  History of Present Illness:  Due to language barrier, an interpreter was present during the history-taking and subsequent discussion (and for part of the physical exam) with this patient. There was no female interpreter available and, due to the sensitive nature of this visit, she elected for her daughter to act as interpretor.    Lori Sullivan is a 53 y.o. female seen in follow-up for OAB, vaginal atrophy, and prolapse. Plan at last visit was continue to do positional changes and support for bowel movements, use estrogen cream, and start pumpkin seed extract.   Patient also reports she has changed her time to stop consuming fluids and is now only getting up 2 times at night instead of the previously reported 5  Patient did not yet start pumpkin seed extract but it has been ordered. She is using vaginal estrogen cream x2 weekly. Patient reports she has had some discharge and would like this to be checked.   Past Medical History: Patient  has a past medical history of Anemia, Constipation, Diabetes mellitus without complication (HCC), Fatigue, GERD (gastroesophageal reflux disease), History of 2019 novel coronavirus disease (COVID-19) ((08-13-2019  per pt only mild cough/ sore throat and resolved in 2 wks, no further symptoms), Hypertension (08-13-2019  in epic ED visit 07-14-2019 pt given norvasc  rx for HTN, per note she was to follow up with her pcp which she has not done due bleeding hemorrhoids,  pt stated last dose approx. 2 wks ago), Internal hemorrhoid, bleeding, Mixed hyperlipidemia, and RECTAL BLEEDING (05/28/2010).   Past Surgical History: She  has a past surgical history that includes Colonoscopy (08-11-2019    DR HUNG  (PER DR WHITE H&P IN EPIC DATED 08-12-2019)); Hemorrhoid surgery (N/A, 09/04/2019); and Rectal exam under anesthesia (N/A, 09/04/2019).   Medications: She has a current medication list which includes the following  prescription(s): acetaminophen , amlodipine , atorvastatin , estradiol , ibuprofen , loratadine, metformin , and omeprazole .   Allergies: Patient has no known allergies.   Social History: Patient  reports that she has never smoked. She has never been exposed to tobacco smoke. She has never used smokeless tobacco. She reports that she does not drink alcohol and does not use drugs.     OBJECTIVE     Physical Exam: Vitals:   03/26/24 0854  BP: 132/75  Pulse: 76   Gen: No apparent distress, A&O x 3.  Detailed Urogynecologic Evaluation:   External vaginal exam unremarkable. Speculum exam reveals non-bleeding tissues, no signs of bleeding, mass, or other irritation. No obvious vaginal discharge.     ASSESSMENT AND PLAN    Lori Sullivan is a 53 y.o. with:  1. Vaginal discharge   2. Vaginal atrophy   3. Nocturia   4. Chronic idiopathic constipation    Aptima swab done on exam. No obvious discharge but due to patient symptoms will check for yeast and BV.  Patient to continue estrogen cream x2 weekly. Tissues were well lubricated on exam and she tolerated speculum exam well. We also discussed if she is concerned about her vaginal tissues shrinking, she could consider use of vaginal dilators or pelvic floor wand. Patient reports this is probably not what she would like to do.  Patient's nocturia has decreased with lifestyle changes. She does still plan to incorporate pumpkin seed extract as well.  Patient is doing well with abdominal massage and positional changes for defecation. She reports she has not been having as many constipation issues.   Patient appears well  managed. Patient to follow up in 1 year or sooner if needed.    Freeda Spivey G Ellana Kawa, NP

## 2024-03-27 ENCOUNTER — Ambulatory Visit: Payer: Self-pay | Admitting: Obstetrics and Gynecology

## 2024-03-27 LAB — CERVICOVAGINAL ANCILLARY ONLY
Bacterial Vaginitis (gardnerella): NEGATIVE
Candida Glabrata: NEGATIVE
Candida Vaginitis: NEGATIVE
Comment: NEGATIVE
Comment: NEGATIVE
Comment: NEGATIVE

## 2024-04-02 ENCOUNTER — Other Ambulatory Visit (HOSPITAL_COMMUNITY): Payer: Self-pay

## 2024-04-02 ENCOUNTER — Ambulatory Visit (INDEPENDENT_AMBULATORY_CARE_PROVIDER_SITE_OTHER): Admitting: Family Medicine

## 2024-04-02 VITALS — BP 127/70 | HR 82 | Ht 59.0 in | Wt 141.0 lb

## 2024-04-02 DIAGNOSIS — J302 Other seasonal allergic rhinitis: Secondary | ICD-10-CM | POA: Diagnosis not present

## 2024-04-02 DIAGNOSIS — R29818 Other symptoms and signs involving the nervous system: Secondary | ICD-10-CM

## 2024-04-02 DIAGNOSIS — Z7952 Long term (current) use of systemic steroids: Secondary | ICD-10-CM | POA: Diagnosis not present

## 2024-04-02 DIAGNOSIS — M7521 Bicipital tendinitis, right shoulder: Secondary | ICD-10-CM

## 2024-04-02 MED ORDER — DICLOFENAC SODIUM 1 % EX GEL
4.0000 g | Freq: Four times a day (QID) | CUTANEOUS | 2 refills | Status: AC | PRN
  Filled 2024-04-02: qty 200, 90d supply, fill #0
  Filled 2024-04-30: qty 200, 90d supply, fill #1

## 2024-04-02 MED ORDER — FLUTICASONE PROPIONATE 50 MCG/ACT NA SUSP
2.0000 | Freq: Every day | NASAL | 6 refills | Status: AC
  Filled 2024-04-02: qty 16, 30d supply, fill #0

## 2024-04-02 NOTE — Progress Notes (Signed)
 SUBJECTIVE:   CHIEF COMPLAINT / HPI:   Discussed the use of AI scribe software for clinical note transcription with the patient, who gave verbal consent to proceed.  History of Present Illness Lori Sullivan is a 53 year old female who presents with right shoulder pain and lower back pain with leg numbness. She is accompanied by her daughter.  Right shoulder pain - Pain localized to the right shoulder with some radiation down the arm - Worsens with arm lifting, especially during work as a pedicurist - Improves with rest - Uses over-the-counter medications for relief - Last used a specific over-the-counter medication two weeks ago and again today, without side effects except concern for acid reflux, blue and yellow Artri King pill   Lower back pain with lower extremity paresthesia - Lower back pain with associated leg numbness for five to six months - Numbness described as 'pins and needles' - Symptoms occur when standing or walking and improve with rest - No bowel or bladder incontinence, no saddle paresthesias - No history of falls or trauma - Topical patches provide some pain relief - normal ABI in 06/2023 without PAD  Gastroesophageal reflux symptoms - Uses omeprazole  for acid reflux - Concern for acid reflux with use of certain over-the-counter medications  Chronic medical conditions and medication adherence - Takes metformin  for diabetes, atorvastatin  for hyperlipidemia, amlodipine  for hypertension (sometimes skips doses), and estradiol  vaginal cream - Uses Claritin and Flonase  for allergies  Psychosocial stressors - Experiencing stress related to family issues following her husband's passing    OBJECTIVE:   BP 127/70   Pulse 82   Ht 4' 11 (1.499 m)   Wt 141 lb (64 kg)   SpO2 96%   BMI 28.48 kg/m   Physical Exam MUSCULOSKELETAL: Deltoid strength 5/5. Biceps and triceps strength 5/5. Subscapularis strength normal. Infraspinatus strength normal. Rotator cuff  intact. No spinal tenderness. No midline spinal tenderness. Pain in biceps groove on palpation. NEUROLOGICAL: Sensation intact in feet bilaterally. 2+ DTRs patellar bialterally. 2+ dorsalis pedis and posterior tibial pulses. Lower extremity strength normal.   ASSESSMENT/PLAN:   Assessment & Plan Right shoulder biceps tendonitis Chronic right shoulder pain due to biceps tendon inflammation, no rotator cuff tear. - Prescribed diclofenac  gel 4g for application four times daily. - Recommended Tylenol  up to 1000 mg four times daily as needed. - Cautioned against Aleve  due to GERD risk. - Advised against taking the blue and yellow pill as it can have dexamethasone  in it, as she has been without for 2 weeks without symptoms will check BMP to ensure electrolytes and BG normal but do not suspect adrenal insuff as I would expect her to have symptoms  Lumbar spinal stenosis with neurogenic claudication Chronic lower back pain with leg numbness, likely lumbar spinal stenosis. Previous imaging inconclusive, further evaluation needed. - Ordered lumbar spine x-ray at [facility]. - Consider MRI based on x-ray results. - Discussed potential treatments including physical therapy and surgery.  Essential hypertension Blood pressure well-controlled, occasional missed doses of amlodipine . - Encouraged daily adherence to amlodipine  5 mg.  Gastroesophageal reflux disease (GERD) GERD symptoms managed with omeprazole , exacerbated by NSAIDs. - Continue omeprazole  as prescribed.  Mixed hyperlipidemia Managed with atorvastatin  80 mg daily.  Allergic rhinitis Symptoms unmanaged. - Prescribed Flonase  nasal spray, two sprays each nostril daily. - Advised Claritin 10 mg daily if needed, safe with Flonase .  Depressed mood - offered counseling and therapy and medication, she notes life stressors over her husbands passing,  declines medication or therapy at this time, no SI     Rollene FORBES Keeling, MD [facility]  [facility]

## 2024-04-02 NOTE — Patient Instructions (Addendum)
 It was wonderful to see you today.  Please bring ALL of your medications with you to every visit.   Today we talked about:  - For your shoulder, you can use the diclofenac  gel four times a day as needed. You can also take Aleve  220mg   twice a day as needed but that can make your stomach worse. Try using tylenol  1000mg  four times a day as needed first.  - We will check your lab work today  - We got an X-ray of your back and will likely get an MRI as well. You can go to [address] to get this done between 830 am to 4:30 pm.  Thank you for choosing [facility].   Please call [phone] with any questions about today's appointment.  Please arrive at least 15 minutes prior to your scheduled appointments.   If you had blood work today, I will send you a MyChart message or a letter if results are normal. Otherwise, I will give you a call.   If you had a referral placed, they will call you to set up an appointment. Please give us  a call if you don't hear back in the next 2 weeks.   If you need additional refills before your next appointment, please call your pharmacy first.  Don't forget to check out the [facility] in the [facility] at [address] [phone] Affordable prices on prescriptions and over-the-counter items, as well as services like vaccinations and medication home delivery.   Rollene Keeling, MD  Family Medicine

## 2024-04-03 LAB — BASIC METABOLIC PANEL WITH GFR
BUN/Creatinine Ratio: 12 (ref 9–23)
BUN: 10 mg/dL (ref 6–24)
CO2: 23 mmol/L (ref 20–29)
Calcium: 9.4 mg/dL (ref 8.7–10.2)
Chloride: 103 mmol/L (ref 96–106)
Creatinine, Ser: 0.81 mg/dL (ref 0.57–1.00)
Glucose: 122 mg/dL — ABNORMAL HIGH (ref 70–99)
Potassium: 3.9 mmol/L (ref 3.5–5.2)
Sodium: 143 mmol/L (ref 134–144)
eGFR: 87 mL/min/1.73 (ref >59)

## 2024-04-04 ENCOUNTER — Other Ambulatory Visit (HOSPITAL_COMMUNITY): Payer: Self-pay

## 2024-04-09 ENCOUNTER — Ambulatory Visit
Admission: RE | Admit: 2024-04-09 | Discharge: 2024-04-09 | Disposition: A | Discharge status: Home / Self Care | Source: Ambulatory Visit | Attending: Family Medicine

## 2024-04-09 DIAGNOSIS — R29818 Other symptoms and signs involving the nervous system: Secondary | ICD-10-CM

## 2024-04-12 ENCOUNTER — Telehealth: Payer: Self-pay | Admitting: Family Medicine

## 2024-04-12 NOTE — Telephone Encounter (Signed)
 Called and spokewith pts daughter, who patient notes is ok to share medical information with. Discsused Xray results and consideration of MRI. She will discsus with her mom and call us  back if she wants the MRI. Also discussed could place referral to physcial therapy.   Rollene Keeling MD

## 2024-04-19 ENCOUNTER — Ambulatory Visit: Payer: Self-pay

## 2024-04-30 ENCOUNTER — Ambulatory Visit

## 2024-04-30 ENCOUNTER — Other Ambulatory Visit: Payer: Self-pay

## 2024-04-30 ENCOUNTER — Other Ambulatory Visit (HOSPITAL_COMMUNITY): Payer: Self-pay

## 2024-04-30 VITALS — BP 135/75 | HR 75 | Ht 60.0 in | Wt 141.8 lb

## 2024-04-30 DIAGNOSIS — Z23 Encounter for immunization: Secondary | ICD-10-CM

## 2024-04-30 DIAGNOSIS — M51362 Other intervertebral disc degeneration, lumbar region with discogenic back pain and lower extremity pain: Secondary | ICD-10-CM | POA: Diagnosis not present

## 2024-04-30 MED ORDER — GABAPENTIN 100 MG PO CAPS
100.0000 mg | ORAL_CAPSULE | Freq: Three times a day (TID) | ORAL | 3 refills | Status: AC
  Filled 2024-04-30: qty 90, 30d supply, fill #0

## 2024-04-30 NOTE — Progress Notes (Unsigned)
    SUBJECTIVE:   CHIEF COMPLAINT / HPI:   Kajol is a 53 yo F who presents to the clinic with primary complaint of worsening lumbar pain and associated radiculopathy down bilateral legs. Patient denies saddle anesthesia, urinary or bowel incontinence, or any other complaints at this time. Lumbar spine xray performed 09/24 significant for mild age-indeterminate height loss of the T12 vertebral body and multilevel degenerative disc and facet disease. She has not been using Voltaren  gel on her back as she did know she could use it there as well as her shoulder.   PERTINENT  PMH / PSH: chronic low back pain with parasthesias  OBJECTIVE:   BP 135/75   Pulse 75   Ht 5' (1.524 m)   Wt 141 lb 12.8 oz (64.3 kg)   SpO2 96%   BMI 27.69 kg/m   General: A&O, NAD HEENT: No sign of trauma, EOM grossly intact Respiratory: normal WOB GI: non-distended  Extremities: no peripheral edema. Neuro: Normal gait, moves all four extremities appropriately, CN II-XII grossly intact, 5/5 strength bilaterally Skin: no lesions/rashes visualized Psych: Appropriate mood and affect Back: mild tenderness to midline lumbar spine, no bony step offs, no paraspinal muscular tenderness  ASSESSMENT/PLAN:   Assessment & Plan Degeneration of intervertebral disc of lumbar region with discogenic back pain and lower extremity pain Persistent and worsening chronic lower back pain with burning paraesthesias down lateral legs. Would like patient to start PT then consider MRI and referral to spine specialist if no improvement.  - PT referral sent  - Start gabapentin  100mg  TID PRN for neuropathic pain - Continue Voltaren  gel PRN for lumbar pain Encounter for immunization Flu vaccine given today with patient permission.  Camie Dixons, DO [facility]

## 2024-04-30 NOTE — Patient Instructions (Addendum)
 It was wonderful to see you today.  Please bring ALL of your medications with you to every visit.   Today we talked about:  Your degenerative disc disease of your lower back.  This is a common spine problem we see with aging, and wear and tear and is causing the neurologic pain down your legs.    I am prescribing you a medication for neuropathic pain called gabapentin  100 mg take this as needed 3 times daily for sharp, burning, stinging pain that you described to me in office today.    You may use your Voltaren  gel on your lower back as needed for pain relief as well.    Please stop taking the supplement you showed me today as I cannot find the ingredient list anywhere online and I am unsure of the effects of this supplement and its interactions with other medications.    I have sent a referral to physical therapy.  Please watch for their phone call for an appointment and to begin this as soon as you are able to get an appointment.  Follow-up with me in 1 month on November 20, at 1:45 PM.  Thank you for choosing [facility].   Please call [phone] with any questions about today's appointment.  Please arrive at least 15 minutes prior to your scheduled appointments.  If you had a referral placed, they will call you to set up an appointment. Please give us  a call if you don't hear back in the next 2 weeks.   If you need additional refills before your next appointment, please call your pharmacy first.   You should follow up in our clinic in 1 month on November 20, at 1:45 PM.  Camie Dixons, DO Family Medicine

## 2024-05-06 ENCOUNTER — Other Ambulatory Visit: Payer: Self-pay | Admitting: Student

## 2024-05-06 ENCOUNTER — Other Ambulatory Visit (HOSPITAL_COMMUNITY): Payer: Self-pay

## 2024-05-07 ENCOUNTER — Other Ambulatory Visit (HOSPITAL_COMMUNITY): Payer: Self-pay

## 2024-05-07 MED ORDER — AMLODIPINE BESYLATE 5 MG PO TABS
5.0000 mg | ORAL_TABLET | Freq: Every day | ORAL | 3 refills | Status: AC
  Filled 2024-05-07: qty 90, 90d supply, fill #0

## 2024-05-13 ENCOUNTER — Other Ambulatory Visit (HOSPITAL_COMMUNITY): Payer: Self-pay

## 2024-06-06 ENCOUNTER — Ambulatory Visit: Payer: Self-pay

## 2024-06-18 ENCOUNTER — Other Ambulatory Visit: Payer: Self-pay

## 2024-06-18 ENCOUNTER — Ambulatory Visit

## 2024-06-18 DIAGNOSIS — M5432 Sciatica, left side: Secondary | ICD-10-CM | POA: Diagnosis present

## 2024-06-18 DIAGNOSIS — M5459 Other low back pain: Secondary | ICD-10-CM | POA: Insufficient documentation

## 2024-06-18 DIAGNOSIS — M5431 Sciatica, right side: Secondary | ICD-10-CM | POA: Insufficient documentation

## 2024-06-18 DIAGNOSIS — M51362 Other intervertebral disc degeneration, lumbar region with discogenic back pain and lower extremity pain: Secondary | ICD-10-CM | POA: Diagnosis not present

## 2024-06-18 NOTE — Therapy (Signed)
 OUTPATIENT PHYSICAL THERAPY THORACOLUMBAR EVALUATION   Patient Name: Lori Sullivan MRN: [id number] DOB:Jul 24, 1970, 53 y.o., female Today's Date: 06/18/2024  END OF SESSION:  PT End of Session - 06/18/24 0952     Visit Number 1    Number of Visits 8    Date for Recertification  08/13/24    Authorization Type [employer] next    Authorization Time Period auth required    PT Start Time 1001    PT Stop Time 1040    PT Time Calculation (min) 39 min    Activity Tolerance Patient tolerated treatment well    Behavior During Therapy [facility] for tasks assessed/performed          Past Medical History:  Diagnosis Date   Anemia    Constipation    Diabetes mellitus without complication (HCC)    Fatigue    GERD (gastroesophageal reflux disease)    History of 2019 novel coronavirus disease (COVID-19) (08-13-2019  per pt only mild cough/ sore throat and resolved in 2 wks, no further symptoms   in epic ED visit 07-14-2019 ,pt exposed , test positive covid w/ mild dry cough and mild sore throat (no fever, sob, difficulty breathing, body aches, headache)   Hypertension 08-13-2019  in epic ED visit 07-14-2019 pt given norvasc  rx for HTN, per note she was to follow up with her pcp which she has not done due bleeding hemorrhoids,  pt stated last dose approx. 2 wks ago   no refills on amlodipine  per spouse   Internal hemorrhoid, bleeding    Mixed hyperlipidemia    RECTAL BLEEDING 05/28/2010   Qualifier: Diagnosis of  By: Genie CMA LEODIS), Chick     Past Surgical History:  Procedure Laterality Date   COLONOSCOPY  08-11-2019    DR HUNG  (PER DR WHITE H&P IN EPIC DATED 08-12-2019)   HEMORRHOID SURGERY N/A 09/04/2019   Procedure: HEMORRHOIDECTOMY;  Surgeon: Teresa Lonni HERO, MD;  Location: [facility];  Service: General;  Laterality: N/A;   RECTAL EXAM UNDER ANESTHESIA N/A 09/04/2019   Procedure: ANORECTAL EXAM UNDER ANESTHESIA;  Surgeon: Teresa Lonni HERO, MD;  Location: [facility];  Service: General;  Laterality: N/A;   Patient Active Problem List   Diagnosis Date Noted   Headache 12/05/2023   Chronic idiopathic constipation 09/18/2023   Nocturia 09/18/2023   Numbness and tingling of both legs 05/10/2023   Toenail fungus 05/10/2023   Shoulder pain, right 03/28/2023   Hematochezia 02/21/2023   Prolapse of posterior vaginal wall 01/31/2023   Encounter for screening mammogram for breast cancer 04/11/2022   Sciatica of left side 07/27/2021   Iron deficiency anemia 07/27/2021   IT band syndrome 07/13/2021   Type 2 diabetes mellitus without complication, without long-term current use of insulin (HCC) 07/13/2021   Frequent urination 03/30/2020   Bilateral sciatica 03/28/2019   Mixed hyperlipidemia 07/12/2018   Thyromegaly 01/04/2015   Essential hypertension 12/05/2014   Internal and external bleeding hemorrhoids 02/20/2013   GERD 05/28/2010    PCP:   Lupie Credit, DO   REFERRING PROVIDER: Madelon Donald HERO, DO  REFERRING DIAG: [phone] (ICD-10-CM) - Degeneration of intervertebral disc of lumbar region with discogenic back pain and lower extremity pain   Rationale for Evaluation and Treatment: Rehabilitation  THERAPY DIAG:  Other low back pain  Sciatica, left side  Sciatica, right side  ONSET DATE: 2 years ago  SUBJECTIVE:  SUBJECTIVE STATEMENT: Pt accompanied by interpreter and daughter.  Pt reports her pain began randomly about 2 years ago and has slightly worsened. She notes pain in the center of the low back and radiating pain plus n/t down both legs. The radiating symptoms are reported as random as well. Sitting is better than standing and no other activities are mentioned as pain inducing.  PERTINENT HISTORY:  DM II, HTN, previous pelvic issues    PAIN:  Are you having pain? Yes: NPRS scale: did not answer Pain location: low back and B legs Pain description: ache, tingling, numbness, tension Aggravating factors: standing Relieving factors: sitting  PRECAUTIONS: None  RED FLAGS: None   WEIGHT BEARING RESTRICTIONS: No  FALLS:  Has patient fallen in last 6 months? No  LIVING ENVIRONMENT: Lives with: lives with their family Lives in: House/apartment Stairs: Yes: External: 3 steps; can reach both Has following equipment at home: None  OCCUPATION: nail technician   PLOF: Independent  PATIENT GOALS: less pain  NEXT MD VISIT: 06/27/2024  OBJECTIVE:  Note: Objective measures were completed at Evaluation unless otherwise noted.  DIAGNOSTIC FINDINGS:  X-ray of lumbar - 1. Mild age-indeterminate height loss of the T12 vertebral body. Correlate for point tenderness. 2. Multilevel degenerative disc and facet disease.    PATIENT SURVEYS:  Modified Oswestry:  MODIFIED OSWESTRY DISABILITY SCALE  Date: 06/18/2024 Score  Pain intensity 1 = The pain is bad, but I can manage without having to take pain medication  2. Personal care (washing, dressing, etc.) 0 =  I can take care of myself normally without causing increased pain.  3. Lifting 0 = I can lift heavy weights without increased pain.  4. Walking 0 = Pain does not prevent me from walking any distance  5. Sitting 1 =  I can only sit in my favorite chair as long as I like.  6. Standing 4 =  Pain prevents me from standing more than 10 minutes.  7. Sleeping 0 = Pain does not prevent me from sleeping well.  8. Social Life 1 =  My social life is normal, but it increases my level of pain.  9. Traveling 3 = My pain restricts my travel over 1 hour  10. Employment/ Homemaking 1 = My normal homemaking/job activities increase my pain, but I can still perform all that is required of me  Total 11/50   Interpretation of scores: Score Category Description  0-20% Minimal  Disability The patient can cope with most living activities. Usually no treatment is indicated apart from advice on lifting, sitting and exercise  21-40% Moderate Disability The patient experiences more pain and difficulty with sitting, lifting and standing. Travel and social life are more difficult and they may be disabled from work. Personal care, sexual activity and sleeping are not grossly affected, and the patient can usually be managed by conservative means  41-60% Severe Disability Pain remains the main problem in this group, but activities of daily living are affected. These patients require a detailed investigation  61-80% Crippled Back pain impinges on all aspects of the patient's life. Positive intervention is required  81-100% Bed-bound These patients are either bed-bound or exaggerating their symptoms  Bluford FORBES Zoe DELENA Karon DELENA, et al. Surgery versus conservative management of stable thoracolumbar fracture: the PRESTO feasibility RCT. [place] ([place]): [employer]; 2021 Nov. [employer] Assessment, No. 25.62.) Appendix 3, Oswestry Disability Index category descriptors. Available from: Findjewelers.cz  Minimally Clinically Important Difference (MCID) = 12.8%  COGNITION: Overall  cognitive status: Within functional limits for tasks assessed     SENSATION: Light touch: WFL  MUSCLE LENGTH: Hamstrings: Right WNL; Left WNL  POSTURE: rounded shoulders, decreased lumbar lordosis, and posterior pelvic tilt  PALPATION: Tenderness over B PSIS and L5. Hypertonicity over R lumbar and thoracic paraspinals.    LUMBAR ROM:   AROM eval  Flexion WNL  Extension WNL  Right lateral flexion WNL  Left lateral flexion WNL  Right rotation WNL  Left rotation WNL   (Blank rows = not tested)  LOWER EXTREMITY ROM:     Active  Right eval Left eval  Hip flexion WNL WNL  Hip extension 5 5  Hip abduction WNL WNL  Hip adduction    Hip internal  rotation WNL WNL  Hip external rotation WNL WNL  Knee flexion WNL WNL  Knee extension WNL WNL  Ankle dorsiflexion WNL WNL  Ankle plantarflexion WNL WNL  Ankle inversion    Ankle eversion     (Blank rows = not tested)  LOWER EXTREMITY MMT:    MMT Right eval Left eval  Hip flexion 5/5 5/5  Hip extension 4/5 4/5  Hip abduction 4+/5 4+/5  Hip adduction    Hip internal rotation    Hip external rotation    Knee flexion 5/5 5/5  Knee extension 5/5 5/5  Ankle dorsiflexion 5/5 5/5  Ankle plantarflexion    Ankle inversion    Ankle eversion     (Blank rows = not tested)  LUMBAR SPECIAL TESTS:  Straight leg raise test: Negative  FUNCTIONAL TESTS:  Functional squat - deferred SLS - deferred  TREATMENT DATE:  [facility] Adult PT Treatment:                                                DATE: 06/18/2024                                                                                                                                 Initial evaluation: see patient education and home exercise program as noted below    PATIENT EDUCATION:  Education details: POC, HEP, diagnosis, prognosis.  Person educated: Patient and Child(ren) Education method: Explanation, Demonstration, and Handouts Education comprehension: verbalized understanding and needs further education  HOME EXERCISE PROGRAM: Access Code: [email or web address] URL: [email or web address] Date: 06/18/2024 Prepared by: Marijo Berber  Exercises - Supine Posterior Pelvic Tilt  - 1 x daily - 7 x weekly - 3 sets - 10 reps - 5sec hold - Clamshell  - 1 x daily - 7 x weekly - 3 sets - 10 reps - Supine Bridge  - 1 x daily - 7 x weekly - 3 sets - 10 reps - Supine Lower Trunk Rotation  - 1 x daily - 7 x weekly - 2 sets - 10 reps  ASSESSMENT:  CLINICAL IMPRESSION: Patient is a 53 y.o. female who was seen today for physical therapy evaluation and treatment for low back pain and B n/t. Pt demonstrates good lumbar and hip mobility,  negative sciatic nerve tension tests, and weakness of B hips. Pt also demonstrates poor core control as seen with bed mobility. Her functional limitations include prolonged standing, sleeping, and occupational tasks. The pt does have a history of DDD which is contributing to her symptoms. The pt will benefit from skilled physical therapy to return decrease pain and increase function.    OBJECTIVE IMPAIRMENTS: decreased activity tolerance, decreased endurance, decreased strength, improper body mechanics, postural dysfunction, and pain.   ACTIVITY LIMITATIONS: lifting and standing  PARTICIPATION LIMITATIONS: community activity and occupation  PERSONAL FACTORS: Fitness, Time since onset of injury/illness/exacerbation, and 3+ comorbidities: DM II, HTN, previous pelvic issues  are also affecting patient's functional outcome.   REHAB POTENTIAL: Fair considering personal factors listed above  CLINICAL DECISION MAKING: Evolving/moderate complexity  EVALUATION COMPLEXITY: Moderate   GOALS: Goals reviewed with patient? Yes  SHORT TERM GOALS: Target date: 07/16/2024  Pt will be compliant and independent with HEP to assist with symptom management/recovery at home.  Baseline: [id number] Goal status: INITIAL  2.  Pt will report 25% or greater improvement since starting PT.  Baseline: 0% Goal status: INITIAL  3.  Pt will be able to stand for 30 minutes without increase in pain. Baseline: 10 mins Goal status: INITIAL   LONG TERM GOALS: Target date: 08/13/2024  Pt will score 8/50 or less on the MODI.  Baseline: 11/50 Goal status: INITIAL  2.  Pt will demonstrate 5-/5 or greater strength in muscles that are not 5/5.  Baseline:  MMT Right eval Left eval  Hip flexion 5/5 5/5  Hip extension 4/5 4/5  Hip abduction 4+/5 4+/5   Goal status: INITIAL  3.  Pt will report 50% less n/t in B LE's since start of PT.  Baseline: 0% Goal status: INITIAL  4.  Pt will be comfortable with her final  HEP in order to continue any symptom management at home and to avoid regression.   Baseline:  [id number] Goal status: INITIAL   PLAN:  PT FREQUENCY: 1-2x/week  PT DURATION: 8 weeks  PLANNED INTERVENTIONS: 97164- PT Re-evaluation, 97110-Therapeutic exercises, 97530- Therapeutic activity, 97112- Neuromuscular re-education, 97535- Self Care, 02859- Manual therapy, [phone]- Aquatic Therapy, [phone]- Electrical stimulation (unattended), [phone]- Traction (mechanical), [phone] (1-2 muscles), 20561 (3+ muscles)- Dry Needling, Patient/Family education, Balance training, Spinal manipulation, Spinal mobilization, Cryotherapy, and Moist heat  For all possible CPT codes, reference the Planned Interventions line above.     Check all conditions that are expected to impact treatment: {Conditions expected to impact treatment:Diabetes mellitus and Social determinants of health   If treatment provided at initial evaluation, no treatment charged due to lack of authorization.       PLAN FOR NEXT SESSION: core stability, hip strengthening, lumbopelvic mobility, assess functional squat and SLS. HEP review.    Marijo DELENA Berber, PT 06/18/2024, 2:59 PM

## 2024-06-25 ENCOUNTER — Ambulatory Visit

## 2024-06-25 DIAGNOSIS — M5459 Other low back pain: Secondary | ICD-10-CM

## 2024-06-25 DIAGNOSIS — M5432 Sciatica, left side: Secondary | ICD-10-CM

## 2024-06-25 DIAGNOSIS — M5431 Sciatica, right side: Secondary | ICD-10-CM

## 2024-06-25 NOTE — Therapy (Signed)
 OUTPATIENT PHYSICAL THERAPY NOTE   Patient Name: Lori Sullivan MRN: [id number] DOB:Sep 27, 1970, 53 y.o., female Today's Date: 06/25/2024  END OF SESSION:  PT End of Session - 06/25/24 0917     Visit Number 2    Number of Visits 8    Date for Recertification  08/13/24    Authorization Type [employer] next    Authorization Time Period approved 16 PT visits from 06/18/24-08/13/24    Authorization - Visit Number 2    Authorization - Number of Visits 16    PT Start Time 0917    PT Stop Time 0955    PT Time Calculation (min) 38 min    Activity Tolerance Patient tolerated treatment well    Behavior During Therapy [facility] for tasks assessed/performed           Past Medical History:  Diagnosis Date   Anemia    Constipation    Diabetes mellitus without complication (HCC)    Fatigue    GERD (gastroesophageal reflux disease)    History of 2019 novel coronavirus disease (COVID-19) (08-13-2019  per pt only mild cough/ sore throat and resolved in 2 wks, no further symptoms   in epic ED visit 07-14-2019 ,pt exposed , test positive covid w/ mild dry cough and mild sore throat (no fever, sob, difficulty breathing, body aches, headache)   Hypertension 08-13-2019  in epic ED visit 07-14-2019 pt given norvasc  rx for HTN, per note she was to follow up with her pcp which she has not done due bleeding hemorrhoids,  pt stated last dose approx. 2 wks ago   no refills on amlodipine  per spouse   Internal hemorrhoid, bleeding    Mixed hyperlipidemia    RECTAL BLEEDING 05/28/2010   Qualifier: Diagnosis of  By: Genie CMA LEODIS), Chick     Past Surgical History:  Procedure Laterality Date   COLONOSCOPY  08-11-2019    DR HUNG  (PER DR WHITE H&P IN EPIC DATED 08-12-2019)   HEMORRHOID SURGERY N/A 09/04/2019   Procedure: HEMORRHOIDECTOMY;  Surgeon: Teresa Lonni HERO, MD;  Location: [facility] [place];  Service: General;  Laterality: N/A;   RECTAL EXAM UNDER ANESTHESIA N/A 09/04/2019   Procedure:  ANORECTAL EXAM UNDER ANESTHESIA;  Surgeon: Teresa Lonni HERO, MD;  Location: [facility] [place];  Service: General;  Laterality: N/A;   Patient Active Problem List   Diagnosis Date Noted   Headache 12/05/2023   Chronic idiopathic constipation 09/18/2023   Nocturia 09/18/2023   Numbness and tingling of both legs 05/10/2023   Toenail fungus 05/10/2023   Shoulder pain, right 03/28/2023   Hematochezia 02/21/2023   Prolapse of posterior vaginal wall 01/31/2023   Encounter for screening mammogram for breast cancer 04/11/2022   Sciatica of left side 07/27/2021   Iron deficiency anemia 07/27/2021   IT band syndrome 07/13/2021   Type 2 diabetes mellitus without complication, without long-term current use of insulin (HCC) 07/13/2021   Frequent urination 03/30/2020   Bilateral sciatica 03/28/2019   Mixed hyperlipidemia 07/12/2018   Thyromegaly 01/04/2015   Essential hypertension 12/05/2014   Internal and external bleeding hemorrhoids 02/20/2013   GERD 05/28/2010    PCP:   Lupie Credit, DO   REFERRING PROVIDER: Madelon Donald HERO, DO  REFERRING DIAG: [phone] (ICD-10-CM) - 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 She has not begun her HEP, but she has been sleeping.   Pt accompanied by interpreter and daughter.   EVAL: Pt reports her pain began randomly about 2 years ago and has slightly worsened. She notes pain in the center of the low back and radiating pain plus n/t down  both legs. The radiating symptoms are reported as random as well. Sitting is better than standing and no other activities are mentioned as pain inducing.  PERTINENT HISTORY:  DM II, HTN, previous pelvic issues   PAIN:  Are you having pain? Yes: NPRS scale: did not answer Pain location: low back and B legs Pain description: ache, tingling, numbness, tension Aggravating factors: standing Relieving factors: sitting  PRECAUTIONS: None  RED FLAGS: None   WEIGHT BEARING RESTRICTIONS: No  FALLS:  Has patient fallen in last 6 months? No  LIVING ENVIRONMENT: Lives with: lives with their family Lives in: House/apartment Stairs: Yes: External: 3 steps; can reach both Has following equipment at home: None  OCCUPATION: nail technician   PLOF: Independent  PATIENT GOALS: less pain  NEXT MD VISIT: 06/27/2024  OBJECTIVE:  Note: Objective measures were completed at Evaluation unless otherwise noted.  DIAGNOSTIC FINDINGS:  X-ray of lumbar - 1. Mild age-indeterminate height loss of the T12 vertebral body. Correlate for point tenderness. 2. Multilevel degenerative disc and facet disease.    PATIENT SURVEYS:  Modified Oswestry:  MODIFIED OSWESTRY DISABILITY SCALE  Date: 06/18/2024 Score  Pain intensity 1 = The pain is bad, but I can manage without having to take pain medication  2. Personal care (washing, dressing, etc.) 0 =  I can take care of myself normally without causing increased pain.  3. Lifting 0 = I can lift heavy weights without increased pain.  4. Walking 0 = Pain does not prevent me from walking any distance  5. Sitting 1 =  I can only sit in my favorite chair as long as I like.  6. Standing 4 =  Pain prevents me from standing more than 10 minutes.  7. Sleeping 0 = Pain does not prevent me from sleeping well.  8. Social Life 1 =  My social life is normal, but it increases my level of pain.  9. Traveling 3 = My pain restricts my travel over 1 hour  10. Employment/  Homemaking 1 = My normal homemaking/job activities increase my pain, but I can still perform all that is required of me  Total 11/50   Interpretation of scores: Score Category Description  0-20% Minimal Disability The patient can cope with most living activities. Usually no treatment is indicated apart from advice on lifting, sitting and exercise  21-40% Moderate Disability The patient experiences more pain and difficulty with sitting, lifting and standing. Travel and social life are more difficult and they may be disabled from work. Personal care, sexual activity and sleeping are not grossly affected, and the patient can usually be managed by conservative means  41-60% Severe Disability Pain remains the main problem in this group, but activities of daily living are affected. These patients require a detailed investigation  61-80% Crippled Back pain impinges on all aspects of the patient's life. Positive intervention is required  81-100% Bed-bound These patients are either bed-bound or exaggerating their symptoms  Bluford FORBES Zoe DELENA Karon DELENA, et al. Surgery versus conservative management of stable thoracolumbar fracture: the PRESTO feasibility RCT. [place] ([place]): [employer]; 2021 Nov. [facility] Technology Assessment, No. 25.62.) Appendix 3, Oswestry Disability Index category descriptors. Available from: Findjewelers.cz  Minimally Clinically Important Difference (MCID) = 12.8%  COGNITION: Overall cognitive status: Within functional limits for tasks assessed     SENSATION: Light touch: WFL  MUSCLE LENGTH: Hamstrings: Right WNL; Left WNL  POSTURE: rounded shoulders, decreased lumbar lordosis, and posterior pelvic tilt  PALPATION: Tenderness over B PSIS and L5. Hypertonicity over R lumbar and thoracic paraspinals.    LUMBAR ROM:   AROM eval  Flexion WNL  Extension WNL  Right lateral flexion WNL  Left lateral flexion WNL  Right rotation WNL   Left rotation WNL   (Blank rows = not tested)  LOWER EXTREMITY ROM:     Active  Right eval Left eval  Hip flexion WNL WNL  Hip extension 5 5  Hip abduction WNL WNL  Hip adduction    Hip internal rotation WNL WNL  Hip external rotation WNL WNL  Knee flexion WNL WNL  Knee extension WNL WNL  Ankle dorsiflexion WNL WNL  Ankle plantarflexion WNL WNL  Ankle inversion    Ankle eversion     (Blank rows = not tested)  LOWER EXTREMITY MMT:    MMT Right eval Left eval  Hip flexion 5/5 5/5  Hip extension 4/5 4/5  Hip abduction 4+/5 4+/5  Hip adduction    Hip internal rotation    Hip external rotation    Knee flexion 5/5 5/5  Knee extension 5/5 5/5  Ankle dorsiflexion 5/5 5/5  Ankle plantarflexion    Ankle inversion    Ankle eversion     (Blank rows = not tested)  LUMBAR SPECIAL TESTS:  Straight leg raise test: Negative  FUNCTIONAL TESTS:  Functional squat - deferred SLS - deferred  TREATMENT DATE:    [facility] Adult PT Treatment:                                                DATE: 06/25/2024  Neuromuscular Reeducation:  LTR x 10 each  PPT x 15  Clamshell RTB 2 x 10 each  Supine Bridge 2 x 10  S/L hip abduction 2 x 8  IT band stretch 3 x 15sec with strap  Seated sciatic nerve glide x 5 each side  Increased time for verbal cueing w/interpreter and tactile cueing for exercise instruction  Reinforced goal to perform PT at least 3 days/week      [facility] Adult PT Treatment:                                                DATE: 06/18/2024                                                                                                                                 Initial evaluation: see patient education and home exercise program as noted below    PATIENT EDUCATION:  Education details: POC, HEP, diagnosis, prognosis.  Person educated: Patient and Child(ren) Education method: Explanation, Demonstration, and Handouts Education comprehension: verbalized understanding  and needs further education  HOME EXERCISE PROGRAM: Access Code: [email or web address] URL: [email or web address] Date: 06/18/2024 Prepared by: Marijo Berber  Exercises - Supine Posterior Pelvic Tilt  - 1 x daily - 7 x weekly - 3 sets - 10 reps - 5sec hold - Clamshell  - 1 x daily - 7 x weekly - 3 sets - 10 reps - Supine Bridge  - 1 x daily - 7 x weekly - 3 sets - 10 reps - Supine Lower Trunk Rotation  - 1 x daily - 7 x weekly - 2 sets - 10 reps  ASSESSMENT:  CLINICAL IMPRESSION: 06/25/2024 Sasha had fair tolerance of initial treatment session. Extra time required for tactile and verbal cues with interpretation for exercise instruction. She reports most relief from LTR and supine IT band stretch. Patient requires ongoing skilled PT intervention to address current impairments and related functional deficits. We will continue to progress as tolerated.    EVAL: Patient is a 53 y.o. female who was seen today for physical therapy evaluation and treatment for low back pain and B n/t. Pt demonstrates good lumbar and hip mobility, negative sciatic nerve tension tests, and weakness of B hips. Pt also demonstrates poor core control as seen with bed mobility. Her functional limitations include prolonged standing, sleeping, and occupational tasks. The pt does have a history of DDD which is contributing to her symptoms. The pt will benefit from skilled physical therapy to return decrease pain and increase function.    OBJECTIVE IMPAIRMENTS: decreased activity tolerance, decreased endurance, decreased strength, improper body mechanics, postural dysfunction, and pain.   ACTIVITY LIMITATIONS: lifting and standing  PARTICIPATION LIMITATIONS: community activity and occupation  PERSONAL FACTORS: Fitness, Time since onset of injury/illness/exacerbation, and 3+ comorbidities: DM II, HTN, previous pelvic issues  are also affecting patient's functional outcome.   REHAB POTENTIAL: Fair considering personal  factors listed above  CLINICAL DECISION MAKING: Evolving/moderate complexity  EVALUATION COMPLEXITY: Moderate   GOALS: Goals reviewed with patient? Yes  SHORT TERM GOALS: Target date: 07/16/2024  Pt will be compliant  and independent with HEP to assist with symptom management/recovery at home.  Baseline: [id number] Goal status: INITIAL  2.  Pt will report 25% or greater improvement since starting PT.  Baseline: 0% Goal status: INITIAL  3.  Pt will be able to stand for 30 minutes without increase in pain. Baseline: 10 mins Goal status: INITIAL   LONG TERM GOALS: Target date: 08/13/2024  Pt will score 8/50 or less on the MODI.  Baseline: 11/50 Goal status: INITIAL  2.  Pt will demonstrate 5-/5 or greater strength in muscles that are not 5/5.  Baseline:  MMT Right eval Left eval  Hip flexion 5/5 5/5  Hip extension 4/5 4/5  Hip abduction 4+/5 4+/5   Goal status: INITIAL  3.  Pt will report 50% less n/t in B LE's since start of PT.  Baseline: 0% Goal status: INITIAL  4.  Pt will be comfortable with her final HEP in order to continue any symptom management at home and to avoid regression.   Baseline:  [id number] Goal status: INITIAL   PLAN:  PT FREQUENCY: 1-2x/week  PT DURATION: 8 weeks  PLANNED INTERVENTIONS: 97164- PT Re-evaluation, 97110-Therapeutic exercises, 97530- Therapeutic activity, 97112- Neuromuscular re-education, 97535- Self Care, 02859- Manual therapy, [phone]- Aquatic Therapy, [phone]- Electrical stimulation (unattended), [phone]- Traction (mechanical), [phone] (1-2 muscles), 20561 (3+ muscles)- Dry Needling, Patient/Family education, Balance training, Spinal manipulation, Spinal mobilization, Cryotherapy, and Moist heat  For all possible CPT codes, reference the Planned Interventions line above.     Check all conditions that are expected to impact treatment: {Conditions expected to impact treatment:Diabetes mellitus and Social determinants of health   If  treatment provided at initial evaluation, no treatment charged due to lack of authorization.       PLAN FOR NEXT SESSION: core stability, hip strengthening, lumbopelvic mobility, assess functional squat and SLS. HEP review.    Marko Molt, PT, DPT  06/25/2024 12:28 PM

## 2024-06-27 ENCOUNTER — Ambulatory Visit (INDEPENDENT_AMBULATORY_CARE_PROVIDER_SITE_OTHER)

## 2024-06-27 ENCOUNTER — Other Ambulatory Visit (HOSPITAL_COMMUNITY): Payer: Self-pay

## 2024-06-27 VITALS — BP 124/72 | HR 72 | Wt 142.8 lb

## 2024-06-27 DIAGNOSIS — M51362 Other intervertebral disc degeneration, lumbar region with discogenic back pain and lower extremity pain: Secondary | ICD-10-CM | POA: Diagnosis not present

## 2024-06-27 MED ORDER — LIDOCAINE 5 % EX PTCH
2.0000 | MEDICATED_PATCH | CUTANEOUS | 0 refills | Status: AC
  Filled 2024-06-27: qty 30, 15d supply, fill #0

## 2024-06-27 MED ORDER — GABAPENTIN 300 MG PO CAPS
300.0000 mg | ORAL_CAPSULE | Freq: Three times a day (TID) | ORAL | 3 refills | Status: AC
  Filled 2024-06-27: qty 90, 30d supply, fill #0

## 2024-06-27 MED ORDER — DICLOFENAC SODIUM 1 % EX GEL
2.0000 g | Freq: Four times a day (QID) | CUTANEOUS | 1 refills | Status: AC | PRN
  Filled 2024-06-27: qty 200, 25d supply, fill #0

## 2024-06-27 NOTE — Progress Notes (Signed)
° ° °  SUBJECTIVE:   CHIEF COMPLAINT / HPI:   Lori Sullivan is a 53 yo F who presents to the clinic with primary complaint of worsening lumbar pain and associated radiculopathy down bilateral legs. Patient denies saddle anesthesia, urinary or bowel incontinence, or any other complaints at this time. Lumbar spine xray performed 09/24 significant for mild age-indeterminate height loss of the T12 vertebral body and multilevel degenerative disc and facet disease.   Patient has been using her Voltaren  gel on her back more since the last visit but not too often.  Lori Sullivan states her pain is not worse but is not better either.  Lori Sullivan has only had 1 PT appointment so far and Lori Sullivan thinks it is not working.  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  PERTINENT  PMH / PSH: DDD, chronic low back pain with bilateral paresthesias  OBJECTIVE:   BP 124/72   Pulse 72   Wt 142 lb 12.8 oz (64.8 kg)   SpO2 100%   BMI 27.89 kg/m   General: A&O, NAD Respiratory: normal WOB, good air movement throughout all lung fields, CTAB CV: RRR, no M/R/G GI: non-distended  Extremities: no peripheral edema Neuro: Normal gait, moves all four extremities appropriately, CN II-XII grossly intact, 5/5 strength bilaterally of all extremities, sensation intact Skin: no lesions/rashes visualized Psych: Appropriate mood and affect Back: No tenderness to midline lumbar spine, no bony step offs, no paraspinal muscular tenderness, complains of low back stiffness during exam, bilateral straight leg raise positive  ASSESSMENT/PLAN:   Assessment & Plan Degeneration of intervertebral disc of lumbar region with discogenic back pain and lower extremity pain No change in pain since last visit.  Recently started physical therapy this week, will need more visits to assess improvement in pain and range of motion.  No red flag signs. -Begin regimen of Tylenol  1000 mg, gabapentin  300 mg, and Voltaren  gel or lidocaine  patch 30  minutes before you go to physical therapy.  Use this regimen on days you have more pain than normal as well. 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 -Consider starting duloxetine vs increasing gabapentin  at next visit. -See me back in 2 months for follow-up.   Camie Dixons, DO [facility]

## 2024-06-27 NOTE — Patient Instructions (Addendum)
 It was wonderful to see you today.  Please bring ALL of your medications with you to every visit.   Today we talked about:  Your back pain due to your degenerative disc disease.  Continue physical therapy, you are doing great!  Take tylenol  1000mg , 300 mg gabapentin , and use voltaren  gel or a lidocaine  patch on your back 30 min before you go to physical therapy.  You should continue the exercises you learned at physical therapy at home as well.  I would like to see you back in 2 months to follow-up on your back pain.  I have sent in your medications to your pharmacy.  Thank you for choosing [facility].   Please call [phone] with any questions about today's appointment.  Please arrive at least 15 minutes prior to your scheduled appointments.   If you had blood work today, I will send you a MyChart message or a letter if results are normal. Otherwise, I will give you a call.   If you had a referral placed, they will call you to set up an appointment. Please give us  a call if you don't hear back in the next 2 weeks.   If you need additional refills before your next appointment, please call your pharmacy first.   You should follow up in our clinic in 2 months.  Camie Dixons, DO Family Medicine

## 2024-07-01 ENCOUNTER — Other Ambulatory Visit (HOSPITAL_COMMUNITY): Payer: Self-pay

## 2024-07-02 ENCOUNTER — Ambulatory Visit

## 2024-07-02 DIAGNOSIS — M5431 Sciatica, right side: Secondary | ICD-10-CM

## 2024-07-02 DIAGNOSIS — M5459 Other low back pain: Secondary | ICD-10-CM

## 2024-07-02 DIAGNOSIS — M5432 Sciatica, left side: Secondary | ICD-10-CM

## 2024-07-02 NOTE — Therapy (Signed)
 OUTPATIENT PHYSICAL THERAPY NOTE   Patient Name: Lori Sullivan MRN: [id number] DOB:[birth year], 53 y.o., female Today's Date: 07/02/2024  END OF SESSION:  PT End of Session - 07/02/24 0840     Visit Number 3    Number of Visits 8    Date for Recertification  08/13/24    Authorization Type [employer] next    Authorization Time Period approved 16 PT visits from 06/18/24-08/13/24    Authorization - Visit Number 3    Authorization - Number of Visits 16    PT Start Time [phone]    PT Stop Time 0914    PT Time Calculation (min) 38 min    Activity Tolerance Patient tolerated treatment well    Behavior During Therapy [facility] for tasks assessed/performed            Past Medical History:  Diagnosis Date   Anemia    Constipation    Diabetes mellitus without complication (HCC)    Fatigue    GERD (gastroesophageal reflux disease)    History of 2019 novel coronavirus disease (COVID-19) (08-13-2019  per pt only mild cough/ sore throat and resolved in 2 wks, no further symptoms   in epic ED visit 07-14-2019 ,pt exposed , test positive covid w/ mild dry cough and mild sore throat (no fever, sob, difficulty breathing, body aches, headache)   Hypertension 08-13-2019  in epic ED visit 07-14-2019 pt given norvasc  rx for HTN, per note she was to follow up with her pcp which she has not done due bleeding hemorrhoids,  pt stated last dose approx. 2 wks ago   no refills on amlodipine  per spouse   Internal hemorrhoid, bleeding    Mixed hyperlipidemia    RECTAL BLEEDING 05/28/2010   Qualifier: Diagnosis of  By: Genie CMA LEODIS), Chick     Past Surgical History:  Procedure Laterality Date   COLONOSCOPY  08-11-2019    DR HUNG  (PER DR WHITE H&P IN EPIC DATED 08-12-2019)   HEMORRHOID SURGERY N/A 09/04/2019   Procedure: HEMORRHOIDECTOMY;  Surgeon: Teresa Lonni HERO, MD;  Location: [facility] [place];  Service: General;  Laterality: N/A;   RECTAL EXAM UNDER ANESTHESIA N/A 09/04/2019   Procedure:  ANORECTAL EXAM UNDER ANESTHESIA;  Surgeon: Teresa Lonni HERO, MD;  Location: [facility] [place];  Service: General;  Laterality: N/A;   Patient Active Problem List   Diagnosis Date Noted   Headache 12/05/2023   Chronic idiopathic constipation 09/18/2023   Nocturia 09/18/2023   Numbness and tingling of both legs 05/10/2023   Toenail fungus 05/10/2023   Shoulder pain, right 03/28/2023   Hematochezia 02/21/2023   Prolapse of posterior vaginal wall 01/31/2023   Encounter for screening mammogram for breast cancer 04/11/2022   Sciatica of left side 07/27/2021   Iron deficiency anemia 07/27/2021   IT band syndrome 07/13/2021   Type 2 diabetes mellitus without complication, without long-term current use of insulin (HCC) 07/13/2021   Frequent urination 03/30/2020   Bilateral sciatica 03/28/2019   Mixed hyperlipidemia 07/12/2018   Thyromegaly 01/04/2015   Essential hypertension 12/05/2014   Internal and external bleeding hemorrhoids 02/20/2013   GERD 05/28/2010    PCP:   Lupie Credit, DO   REFERRING PROVIDER: Madelon Donald HERO, DO  REFERRING DIAG: [phone] (ICD-10-CM) - Degeneration of intervertebral disc of lumbar region with discogenic back pain and lower extremity pain   Rationale for Evaluation and Treatment: Rehabilitation  THERAPY DIAG:  Other low back pain  Sciatica, left side  Sciatica, right  side  ONSET DATE: 2 years ago  SUBJECTIVE:                                                                                                                                                                                           SUBJECTIVE STATEMENT:  07/02/2024 Patient reporting 5/10 pain in the low back, nothing down the legs. Pt is having less pain overall.   Pt accompanied by interpreter and daughter.   EVAL: Pt reports her pain began randomly about 2 years ago and has slightly worsened. She notes pain in the center of the low back and radiating pain plus n/t down  both legs. The radiating symptoms are reported as random as well. Sitting is better than standing and no other activities are mentioned as pain inducing.  PERTINENT HISTORY:  DM II, HTN, previous pelvic issues   PAIN:  Are you having pain? Yes: NPRS scale: did not answer Pain location: low back and B legs Pain description: ache, tingling, numbness, tension Aggravating factors: standing Relieving factors: sitting  PRECAUTIONS: None  RED FLAGS: None   WEIGHT BEARING RESTRICTIONS: No  FALLS:  Has patient fallen in last 6 months? No  LIVING ENVIRONMENT: Lives with: lives with their family Lives in: House/apartment Stairs: Yes: External: 3 steps; can reach both Has following equipment at home: None  OCCUPATION: nail technician   PLOF: Independent  PATIENT GOALS: less pain  NEXT MD VISIT: 06/27/2024  OBJECTIVE:  Note: Objective measures were completed at Evaluation unless otherwise noted.  DIAGNOSTIC FINDINGS:  X-ray of lumbar - 1. Mild age-indeterminate height loss of the T12 vertebral body. Correlate for point tenderness. 2. Multilevel degenerative disc and facet disease.    PATIENT SURVEYS:  Modified Oswestry:  MODIFIED OSWESTRY DISABILITY SCALE  Date: 06/18/2024 Score  Pain intensity 1 = The pain is bad, but I can manage without having to take pain medication  2. Personal care (washing, dressing, etc.) 0 =  I can take care of myself normally without causing increased pain.  3. Lifting 0 = I can lift heavy weights without increased pain.  4. Walking 0 = Pain does not prevent me from walking any distance  5. Sitting 1 =  I can only sit in my favorite chair as long as I like.  6. Standing 4 =  Pain prevents me from standing more than 10 minutes.  7. Sleeping 0 = Pain does not prevent me from sleeping well.  8. Social Life 1 =  My social life is normal, but it increases my level of pain.  9. Traveling 3 = My pain restricts my travel over 1 hour  10. Employment/  Homemaking 1 = My normal homemaking/job activities increase my pain, but I can still perform all that is required of me  Total 11/50   Interpretation of scores: Score Category Description  0-20% Minimal Disability The patient can cope with most living activities. Usually no treatment is indicated apart from advice on lifting, sitting and exercise  21-40% Moderate Disability The patient experiences more pain and difficulty with sitting, lifting and standing. Travel and social life are more difficult and they may be disabled from work. Personal care, sexual activity and sleeping are not grossly affected, and the patient can usually be managed by conservative means  41-60% Severe Disability Pain remains the main problem in this group, but activities of daily living are affected. These patients require a detailed investigation  61-80% Crippled Back pain impinges on all aspects of the patients life. Positive intervention is required  81-100% Bed-bound These patients are either bed-bound or exaggerating their symptoms  Bluford FORBES Zoe DELENA Karon DELENA, et al. Surgery versus conservative management of stable thoracolumbar fracture: the PRESTO feasibility RCT. [place] ([place]): [employer]; 2021 Nov. [facility] Technology Assessment, No. 25.62.) Appendix 3, Oswestry Disability Index category descriptors. Available from: Findjewelers.cz  Minimally Clinically Important Difference (MCID) = 12.8%  COGNITION: Overall cognitive status: Within functional limits for tasks assessed     SENSATION: Light touch: WFL  MUSCLE LENGTH: Hamstrings: Right WNL; Left WNL  POSTURE: rounded shoulders, decreased lumbar lordosis, and posterior pelvic tilt  PALPATION: Tenderness over B PSIS and L5. Hypertonicity over R lumbar and thoracic paraspinals.    LUMBAR ROM:   AROM eval  Flexion WNL  Extension WNL  Right lateral flexion WNL  Left lateral flexion WNL  Right rotation WNL   Left rotation WNL   (Blank rows = not tested)  LOWER EXTREMITY ROM:     Active  Right eval Left eval  Hip flexion WNL WNL  Hip extension 5 5  Hip abduction WNL WNL  Hip adduction    Hip internal rotation WNL WNL  Hip external rotation WNL WNL  Knee flexion WNL WNL  Knee extension WNL WNL  Ankle dorsiflexion WNL WNL  Ankle plantarflexion WNL WNL  Ankle inversion    Ankle eversion     (Blank rows = not tested)  LOWER EXTREMITY MMT:    MMT Right eval Left eval  Hip flexion 5/5 5/5  Hip extension 4/5 4/5  Hip abduction 4+/5 4+/5  Hip adduction    Hip internal rotation    Hip external rotation    Knee flexion 5/5 5/5  Knee extension 5/5 5/5  Ankle dorsiflexion 5/5 5/5  Ankle plantarflexion    Ankle inversion    Ankle eversion     (Blank rows = not tested)  LUMBAR SPECIAL TESTS:  Straight leg raise test: Negative  FUNCTIONAL TESTS:  Functional squat - deferred SLS - deferred  TREATMENT DATE:    [facility] Adult PT Treatment:                                                DATE: 07/02/2024  Neuromuscular Reeducation:  LTR on ball x 10 each  Bridge with legs extended on ball x 15 DKTC with ball x 15 Supine ball press down x 10  Supine mini crunch with foam roller x 10  Seated hip hinge x 10, with 8lb  DB x 10  Seated ball roll outs x 10 each direction     [facility] Adult PT Treatment:                                                DATE: 06/18/2024                                                                                                                                 Initial evaluation: see patient education and home exercise program as noted below    PATIENT EDUCATION:  Education details: POC, HEP, diagnosis, prognosis.  Person educated: Patient and Child(ren) Education method: Explanation, Demonstration, and Handouts Education comprehension: verbalized understanding and needs further education  HOME EXERCISE PROGRAM: Access Code: [email or web address] URL:  [email or web address] Date: 06/18/2024 Prepared by: Marijo Berber  Exercises - Supine Posterior Pelvic Tilt  - 1 x daily - 7 x weekly - 3 sets - 10 reps - 5sec hold - Clamshell  - 1 x daily - 7 x weekly - 3 sets - 10 reps - Supine Bridge  - 1 x daily - 7 x weekly - 3 sets - 10 reps - Supine Lower Trunk Rotation  - 1 x daily - 7 x weekly - 2 sets - 10 reps  ASSESSMENT:  CLINICAL IMPRESSION: 07/02/2024 Avneet was able to complete all exercises without exacerbation of symptoms. She had relief with stretching exercises. Began teaching gluteal engagement via hip hinge which was performed with good form.  Patient requires ongoing skilled PT intervention to address current impairments and related functional deficits. We will continue to progress as tolerated.    EVAL: Patient is a 53 y.o. female who was seen today for physical therapy evaluation and treatment for low back pain and B n/t. Pt demonstrates good lumbar and hip mobility, negative sciatic nerve tension tests, and weakness of B hips. Pt also demonstrates poor core control as seen with bed mobility. Her functional limitations include prolonged standing, sleeping, and occupational tasks. The pt does have a history of DDD which is contributing to her symptoms. The pt will benefit from skilled physical therapy to return decrease pain and increase function.    OBJECTIVE IMPAIRMENTS: decreased activity tolerance, decreased endurance, decreased strength, improper body mechanics, postural dysfunction, and pain.   ACTIVITY LIMITATIONS: lifting and standing  PARTICIPATION LIMITATIONS: community activity and occupation  PERSONAL FACTORS: Fitness, Time since onset of injury/illness/exacerbation, and 3+ comorbidities: DM II, HTN, previous pelvic issues  are also affecting patient's functional outcome.   REHAB POTENTIAL: Fair considering personal factors listed above  CLINICAL DECISION MAKING: Evolving/moderate complexity  EVALUATION  COMPLEXITY: Moderate   GOALS: Goals reviewed with patient? Yes  SHORT TERM GOALS: Target date: 07/16/2024  Pt will be compliant and independent with HEP to assist with symptom management/recovery at home.  Baseline: [id number] Goal status: INITIAL  2.  Pt will report 25% or greater improvement since starting PT.  Baseline: 0% Goal status: INITIAL  3.  Pt will be able to stand for 30 minutes without increase in pain. Baseline: 10 mins Goal status: INITIAL   LONG TERM GOALS: Target date: 08/13/2024  Pt will score 8/50 or less on the MODI.  Baseline: 11/50 Goal status: INITIAL  2.  Pt will demonstrate 5-/5 or greater strength in muscles that are not 5/5.  Baseline:  MMT Right eval Left eval  Hip flexion 5/5 5/5  Hip extension 4/5 4/5  Hip abduction 4+/5 4+/5   Goal status: INITIAL  3.  Pt will report 50% less n/t in B LE's since start of PT.  Baseline: 0% Goal status: INITIAL  4.  Pt will be comfortable with her final HEP in order to continue any symptom management at home and to avoid regression.   Baseline:  [id number] Goal status: INITIAL   PLAN:  PT FREQUENCY: 1-2x/week  PT DURATION: 8 weeks  PLANNED INTERVENTIONS: 97164- PT Re-evaluation, 97110-Therapeutic exercises, 97530- Therapeutic activity, 97112- Neuromuscular re-education, 97535- Self Care, 02859- Manual therapy, [phone]- Aquatic Therapy, [phone]- Electrical stimulation (unattended), [phone]- Traction (mechanical), [phone] (1-2 muscles), [phone] (3+ muscles)- Dry Needling, Patient/Family education, Balance training, Spinal manipulation, Spinal mobilization, Cryotherapy, and Moist heat  For all possible CPT codes, reference the Planned Interventions line above.     Check all conditions that are expected to impact treatment: {Conditions expected to impact treatment:Diabetes mellitus and Social determinants of health   If treatment provided at initial evaluation, no treatment charged due to lack of authorization.        PLAN FOR NEXT SESSION: core stability, hip strengthening, lumbopelvic mobility, assess functional squat and SLS. HEP review.    Marijo Berber PT, DPT 07/02/2024 8:45 AM

## 2024-07-09 ENCOUNTER — Ambulatory Visit

## 2024-07-16 ENCOUNTER — Ambulatory Visit

## 2024-07-16 DIAGNOSIS — M5432 Sciatica, left side: Secondary | ICD-10-CM

## 2024-07-16 DIAGNOSIS — M5459 Other low back pain: Secondary | ICD-10-CM

## 2024-07-16 DIAGNOSIS — M5431 Sciatica, right side: Secondary | ICD-10-CM

## 2024-07-16 NOTE — Therapy (Signed)
 " OUTPATIENT PHYSICAL THERAPY NOTE   Patient Name: Lori Sullivan MRN: [id number] DOB:[birth year], 53 y.o., female Today's Date: 07/16/2024  END OF SESSION:  PT End of Session - 07/16/24 0835     Visit Number 4    Number of Visits 8    Date for Recertification  08/13/24    Authorization Type [employer] next    Authorization Time Period approved 16 PT visits from 06/18/24-08/13/24    Authorization - Visit Number 4    Authorization - Number of Visits 16    PT Start Time [phone]    PT Stop Time 0913    PT Time Calculation (min) 38 min    Activity Tolerance Patient tolerated treatment well    Behavior During Therapy [facility] for tasks assessed/performed         Past Medical History:  Diagnosis Date   Anemia    Constipation    Diabetes mellitus without complication (HCC)    Fatigue    GERD (gastroesophageal reflux disease)    History of 2019 novel coronavirus disease (COVID-19) (08-13-2019  per pt only mild cough/ sore throat and resolved in 2 wks, no further symptoms   in epic ED visit 07-14-2019 ,pt exposed , test positive covid w/ mild dry cough and mild sore throat (no fever, sob, difficulty breathing, body aches, headache)   Hypertension 08-13-2019  in epic ED visit 07-14-2019 pt given norvasc  rx for HTN, per note she was to follow up with her pcp which she has not done due bleeding hemorrhoids,  pt stated last dose approx. 2 wks ago   no refills on amlodipine  per spouse   Internal hemorrhoid, bleeding    Mixed hyperlipidemia    RECTAL BLEEDING 05/28/2010   Qualifier: Diagnosis of  By: Genie CMA LEODIS), Chick     Past Surgical History:  Procedure Laterality Date   COLONOSCOPY  08-11-2019    DR HUNG  (PER DR WHITE H&P IN EPIC DATED 08-12-2019)   HEMORRHOID SURGERY N/A 09/04/2019   Procedure: HEMORRHOIDECTOMY;  Surgeon: Teresa Lonni HERO, MD;  Location: [facility] [place];  Service: General;  Laterality: N/A;   RECTAL EXAM UNDER ANESTHESIA N/A 09/04/2019   Procedure:  ANORECTAL EXAM UNDER ANESTHESIA;  Surgeon: Teresa Lonni HERO, MD;  Location: [facility] [place];  Service: General;  Laterality: N/A;   Patient Active Problem List   Diagnosis Date Noted   Headache 12/05/2023   Chronic idiopathic constipation 09/18/2023   Nocturia 09/18/2023   Numbness and tingling of both legs 05/10/2023   Toenail fungus 05/10/2023   Shoulder pain, right 03/28/2023   Hematochezia 02/21/2023   Prolapse of posterior vaginal wall 01/31/2023   Encounter for screening mammogram for breast cancer 04/11/2022   Sciatica of left side 07/27/2021   Iron deficiency anemia 07/27/2021   IT band syndrome 07/13/2021   Type 2 diabetes mellitus without complication, without long-term current use of insulin (HCC) 07/13/2021   Frequent urination 03/30/2020   Bilateral sciatica 03/28/2019   Mixed hyperlipidemia 07/12/2018   Thyromegaly 01/04/2015   Essential hypertension 12/05/2014   Internal and external bleeding hemorrhoids 02/20/2013   GERD 05/28/2010    PCP:   Lupie Credit, DO   REFERRING PROVIDER: Madelon Donald HERO, DO  REFERRING DIAG: [phone] (ICD-10-CM) - Degeneration of intervertebral disc of lumbar region with discogenic back pain and lower extremity pain   Rationale for Evaluation and Treatment: Rehabilitation  THERAPY DIAG:  Other low back pain  Sciatica, left side  Sciatica, right side  ONSET DATE: 2 years ago  SUBJECTIVE:                                                                                                                                                                                           SUBJECTIVE STATEMENT:  07/16/2024 Patient reports continued pain, daily HEP compliance.  Pt accompanied by interpreter and daughter.   EVAL: Pt reports her pain began randomly about 2 years ago and has slightly worsened. She notes pain in the center of the low back and radiating pain plus n/t down both legs. The radiating symptoms are reported  as random as well. Sitting is better than standing and no other activities are mentioned as pain inducing.  PERTINENT HISTORY:  DM II, HTN, previous pelvic issues   PAIN:  Are you having pain? Yes: NPRS scale: did not answer Pain location: low back and B legs Pain description: ache, tingling, numbness, tension Aggravating factors: standing Relieving factors: sitting  PRECAUTIONS: None  RED FLAGS: None   WEIGHT BEARING RESTRICTIONS: No  FALLS:  Has patient fallen in last 6 months? No  LIVING ENVIRONMENT: Lives with: lives with their family Lives in: House/apartment Stairs: Yes: External: 3 steps; can reach both Has following equipment at home: None  OCCUPATION: nail technician   PLOF: Independent  PATIENT GOALS: less pain  NEXT MD VISIT: 06/27/2024  OBJECTIVE:  Note: Objective measures were completed at Evaluation unless otherwise noted.  DIAGNOSTIC FINDINGS:  X-ray of lumbar - 1. Mild age-indeterminate height loss of the T12 vertebral body. Correlate for point tenderness. 2. Multilevel degenerative disc and facet disease.    PATIENT SURVEYS:  Modified Oswestry:  MODIFIED OSWESTRY DISABILITY SCALE  Date: 06/18/2024 Score  Pain intensity 1 = The pain is bad, but I can manage without having to take pain medication  2. Personal care (washing, dressing, etc.) 0 =  I can take care of myself normally without causing increased pain.  3. Lifting 0 = I can lift heavy weights without increased pain.  4. Walking 0 = Pain does not prevent me from walking any distance  5. Sitting 1 =  I can only sit in my favorite chair as long as I like.  6. Standing 4 =  Pain prevents me from standing more than 10 minutes.  7. Sleeping 0 = Pain does not prevent me from sleeping well.  8. Social Life 1 =  My social life is normal, but it increases my level of pain.  9. Traveling 3 = My pain restricts my travel over 1 hour  10. Employment/ Homemaking 1 = My normal homemaking/job activities  increase my pain,  but I can still perform all that is required of me  Total 11/50   Interpretation of scores: Score Category Description  0-20% Minimal Disability The patient can cope with most living activities. Usually no treatment is indicated apart from advice on lifting, sitting and exercise  21-40% Moderate Disability The patient experiences more pain and difficulty with sitting, lifting and standing. Travel and social life are more difficult and they may be disabled from work. Personal care, sexual activity and sleeping are not grossly affected, and the patient can usually be managed by conservative means  41-60% Severe Disability Pain remains the main problem in this group, but activities of daily living are affected. These patients require a detailed investigation  61-80% Crippled Back pain impinges on all aspects of the patients life. Positive intervention is required  81-100% Bed-bound These patients are either bed-bound or exaggerating their symptoms  Bluford FORBES Zoe DELENA Karon DELENA, et al. Surgery versus conservative management of stable thoracolumbar fracture: the PRESTO feasibility RCT. [place] ([place]): [employer]; 2021 Nov. [facility] Technology Assessment, No. 25.62.) Appendix 3, Oswestry Disability Index category descriptors. Available from: [email or web address].cz  Minimally Clinically Important Difference (MCID) = 12.8%  COGNITION: Overall cognitive status: Within functional limits for tasks assessed     SENSATION: Light touch: WFL  MUSCLE LENGTH: Hamstrings: Right WNL; Left WNL  POSTURE: rounded shoulders, decreased lumbar lordosis, and posterior pelvic tilt  PALPATION: Tenderness over B PSIS and L5. Hypertonicity over R lumbar and thoracic paraspinals.    LUMBAR ROM:   AROM eval  Flexion WNL  Extension WNL  Right lateral flexion WNL  Left lateral flexion WNL  Right rotation WNL  Left rotation WNL   (Blank rows = not  tested)  LOWER EXTREMITY ROM:     Active  Right eval Left eval  Hip flexion WNL WNL  Hip extension 5 5  Hip abduction WNL WNL  Hip adduction    Hip internal rotation WNL WNL  Hip external rotation WNL WNL  Knee flexion WNL WNL  Knee extension WNL WNL  Ankle dorsiflexion WNL WNL  Ankle plantarflexion WNL WNL  Ankle inversion    Ankle eversion     (Blank rows = not tested)  LOWER EXTREMITY MMT:    MMT Right eval Left eval  Hip flexion 5/5 5/5  Hip extension 4/5 4/5  Hip abduction 4+/5 4+/5  Hip adduction    Hip internal rotation    Hip external rotation    Knee flexion 5/5 5/5  Knee extension 5/5 5/5  Ankle dorsiflexion 5/5 5/5  Ankle plantarflexion    Ankle inversion    Ankle eversion     (Blank rows = not tested)  LUMBAR SPECIAL TESTS:  Straight leg raise test: Negative  FUNCTIONAL TESTS:  Functional squat - deferred SLS - deferred  TREATMENT DATE:  [facility] Adult PT Treatment:                                                DATE: 07/16/24 Therapeutic Exercise: Nustep level 5 x 5 mins Supine figure 4 piriformis stretch 2x30 Seated marching 2x30  SLR x10 BIL STS x10, with OH reach x10 Neuromuscular Reeducation:  LTR on ball 2x10 each  Supine sciatic nerve glide 2x10 BIL Bridge with legs extended on ball 2x10 DKTC with ball x 15 Supine ball press down x 10  Seated  hip hinge x 10, with 8lb DB x 10 - cues for form Seated ball roll outs x 10 each direction   [facility] Adult PT Treatment:                                                DATE: 07/02/2024  Neuromuscular Reeducation:  LTR on ball x 10 each  Bridge with legs extended on ball x 15 DKTC with ball x 15 Supine ball press down x 10  Supine mini crunch with foam roller x 10  Seated hip hinge x 10, with 8lb DB x 10  Seated ball roll outs x 10 each direction    PATIENT EDUCATION:  Education details: POC, HEP, diagnosis, prognosis.  Person educated: Patient and Child(ren) Education method:  Explanation, Demonstration, and Handouts Education comprehension: verbalized understanding and needs further education  HOME EXERCISE PROGRAM: Access Code: [email or web address] URL: [email or web address] Date: 06/18/2024 Prepared by: Marijo Berber  Exercises - Supine Posterior Pelvic Tilt  - 1 x daily - 7 x weekly - 3 sets - 10 reps - 5sec hold - Clamshell  - 1 x daily - 7 x weekly - 3 sets - 10 reps - Supine Bridge  - 1 x daily - 7 x weekly - 3 sets - 10 reps - Supine Lower Trunk Rotation  - 1 x daily - 7 x weekly - 2 sets - 10 reps  ASSESSMENT:  CLINICAL IMPRESSION: Patient presents to PT with daughter and in person interpreter who assist with subjective. She states she continues to have pain, completes her HEP daily. Session today continued to focus on proximal hip and core strengthening as well as lumbar mobility. She was able to tolerate increased resistance and repetitions today. Requires occasional cues for hip hinge technique, able to to perform independently. Patient continues to benefit from skilled PT services and should be progressed as able to improve functional independence.   EVAL: Patient is a 54 y.o. female who was seen today for physical therapy evaluation and treatment for low back pain and B n/t. Pt demonstrates good lumbar and hip mobility, negative sciatic nerve tension tests, and weakness of B hips. Pt also demonstrates poor core control as seen with bed mobility. Her functional limitations include prolonged standing, sleeping, and occupational tasks. The pt does have a history of DDD which is contributing to her symptoms. The pt will benefit from skilled physical therapy to return decrease pain and increase function.    OBJECTIVE IMPAIRMENTS: decreased activity tolerance, decreased endurance, decreased strength, improper body mechanics, postural dysfunction, and pain.   ACTIVITY LIMITATIONS: lifting and standing  PARTICIPATION LIMITATIONS: community activity and  occupation  PERSONAL FACTORS: Fitness, Time since onset of injury/illness/exacerbation, and 3+ comorbidities: DM II, HTN, previous pelvic issues  are also affecting patient's functional outcome.   REHAB POTENTIAL: Fair considering personal factors listed above  CLINICAL DECISION MAKING: Evolving/moderate complexity  EVALUATION COMPLEXITY: Moderate   GOALS: Goals reviewed with patient? Yes  SHORT TERM GOALS: Target date: 07/16/2024  Pt will be compliant and independent with HEP to assist with symptom management/recovery at home.  Baseline: [id number] Goal status: MET Pt reports adherence 07/16/24  2.  Pt will report 25% or greater improvement since starting PT.  Baseline: 0% Goal status: INITIAL  3.  Pt will be able to stand for 30 minutes without increase in pain. Baseline: 10 mins  Goal status: INITIAL   LONG TERM GOALS: Target date: 08/13/2024  Pt will score 8/50 or less on the MODI.  Baseline: 11/50 Goal status: INITIAL  2.  Pt will demonstrate 5-/5 or greater strength in muscles that are not 5/5.  Baseline:  MMT Right eval Left eval  Hip flexion 5/5 5/5  Hip extension 4/5 4/5  Hip abduction 4+/5 4+/5   Goal status: INITIAL  3.  Pt will report 50% less n/t in B LE's since start of PT.  Baseline: 0% Goal status: INITIAL  4.  Pt will be comfortable with her final HEP in order to continue any symptom management at home and to avoid regression.   Baseline:  [id number] Goal status: INITIAL   PLAN:  PT FREQUENCY: 1-2x/week  PT DURATION: 8 weeks  PLANNED INTERVENTIONS: 97164- PT Re-evaluation, 97110-Therapeutic exercises, 97530- Therapeutic activity, 97112- Neuromuscular re-education, 97535- Self Care, [id number]- Manual therapy, [phone]- Aquatic Therapy, [phone]- Electrical stimulation (unattended), [phone]- Traction (mechanical), [phone] (1-2 muscles), [id number] (3+ muscles)- Dry Needling, Patient/Family education, Balance training, Spinal manipulation, Spinal mobilization,  Cryotherapy, and Moist heat  For all possible CPT codes, reference the Planned Interventions line above.     Check all conditions that are expected to impact treatment: {Conditions expected to impact treatment:Diabetes mellitus and Social determinants of health   If treatment provided at initial evaluation, no treatment charged due to lack of authorization.       PLAN FOR NEXT SESSION: core stability, hip strengthening, lumbopelvic mobility, assess functional squat and SLS. HEP review.    Corean Pouch PTA  07/16/2024 9:13 AM  "

## 2024-07-23 ENCOUNTER — Ambulatory Visit: Attending: Family Medicine

## 2024-07-23 DIAGNOSIS — M5431 Sciatica, right side: Secondary | ICD-10-CM | POA: Insufficient documentation

## 2024-07-23 DIAGNOSIS — M5459 Other low back pain: Secondary | ICD-10-CM | POA: Diagnosis present

## 2024-07-23 DIAGNOSIS — M5432 Sciatica, left side: Secondary | ICD-10-CM | POA: Diagnosis present

## 2024-07-23 NOTE — Therapy (Signed)
 " OUTPATIENT PHYSICAL THERAPY NOTE   Patient Name: Lori Sullivan MRN: [id number] DOB:04-10-1971, 54 y.o., female Today's Date: 07/23/2024  END OF SESSION:  PT End of Session - 07/23/24 0838     Visit Number 5    Number of Visits 8    Date for Recertification  08/13/24    Authorization Type [employer] next    Authorization Time Period approved 16 PT visits from 06/18/24-08/13/24    Authorization - Visit Number 5    Authorization - Number of Visits 16    PT Start Time [phone]   Pt arrived late   PT Stop Time 0911    PT Time Calculation (min) 33 min    Activity Tolerance Patient tolerated treatment well    Behavior During Therapy [facility] for tasks assessed/performed          Past Medical History:  Diagnosis Date   Anemia    Constipation    Diabetes mellitus without complication (HCC)    Fatigue    GERD (gastroesophageal reflux disease)    History of 2019 novel coronavirus disease (COVID-19) (08-13-2019  per pt only mild cough/ sore throat and resolved in 2 wks, no further symptoms   in epic ED visit 07-14-2019 ,pt exposed , test positive covid w/ mild dry cough and mild sore throat (no fever, sob, difficulty breathing, body aches, headache)   Hypertension 08-13-2019  in epic ED visit 07-14-2019 pt given norvasc  rx for HTN, per note she was to follow up with her pcp which she has not done due bleeding hemorrhoids,  pt stated last dose approx. 2 wks ago   no refills on amlodipine  per spouse   Internal hemorrhoid, bleeding    Mixed hyperlipidemia    RECTAL BLEEDING 05/28/2010   Qualifier: Diagnosis of  By: Genie CMA LEODIS), Chick     Past Surgical History:  Procedure Laterality Date   COLONOSCOPY  08-11-2019    DR HUNG  (PER DR WHITE H&P IN EPIC DATED 08-12-2019)   HEMORRHOID SURGERY N/A 09/04/2019   Procedure: HEMORRHOIDECTOMY;  Surgeon: Teresa Lonni HERO, MD;  Location: [facility];  Service: General;  Laterality: N/A;   RECTAL EXAM UNDER ANESTHESIA N/A 09/04/2019    Procedure: ANORECTAL EXAM UNDER ANESTHESIA;  Surgeon: Teresa Lonni HERO, MD;  Location: [facility];  Service: General;  Laterality: N/A;   Patient Active Problem List   Diagnosis Date Noted   Headache 12/05/2023   Chronic idiopathic constipation 09/18/2023   Nocturia 09/18/2023   Numbness and tingling of both legs 05/10/2023   Toenail fungus 05/10/2023   Shoulder pain, right 03/28/2023   Hematochezia 02/21/2023   Prolapse of posterior vaginal wall 01/31/2023   Encounter for screening mammogram for breast cancer 04/11/2022   Sciatica of left side 07/27/2021   Iron deficiency anemia 07/27/2021   IT band syndrome 07/13/2021   Type 2 diabetes mellitus without complication, without long-term current use of insulin (HCC) 07/13/2021   Frequent urination 03/30/2020   Bilateral sciatica 03/28/2019   Mixed hyperlipidemia 07/12/2018   Thyromegaly 01/04/2015   Essential hypertension 12/05/2014   Internal and external bleeding hemorrhoids 02/20/2013   GERD 05/28/2010    PCP:   Lupie Credit, DO   REFERRING PROVIDER: Madelon Donald HERO, DO  REFERRING DIAG: [phone] (ICD-10-CM) - Degeneration of intervertebral disc of lumbar region with discogenic back pain and lower extremity pain   Rationale for Evaluation and Treatment: Rehabilitation  THERAPY DIAG:  Other low back pain  Sciatica, left side  Sciatica, right side  ONSET DATE: 2 years ago  SUBJECTIVE:                                                                                                                                                                                           SUBJECTIVE STATEMENT:  07/23/2024 Patient reports she had some pain while standing yesterday that went down her LLE, she says she does not have any pain while sitting.  Pt accompanied daughter, no in person interpreter today, session limited by language barrier.  EVAL: Pt reports her pain began randomly about 2 years ago and has  slightly worsened. She notes pain in the center of the low back and radiating pain plus n/t down both legs. The radiating symptoms are reported as random as well. Sitting is better than standing and no other activities are mentioned as pain inducing.  PERTINENT HISTORY:  DM II, HTN, previous pelvic issues   PAIN:  Are you having pain? Yes: NPRS scale: did not answer Pain location: low back and B legs Pain description: ache, tingling, numbness, tension Aggravating factors: standing Relieving factors: sitting  PRECAUTIONS: None  RED FLAGS: None   WEIGHT BEARING RESTRICTIONS: No  FALLS:  Has patient fallen in last 6 months? No  LIVING ENVIRONMENT: Lives with: lives with their family Lives in: House/apartment Stairs: Yes: External: 3 steps; can reach both Has following equipment at home: None  OCCUPATION: nail technician   PLOF: Independent  PATIENT GOALS: less pain  NEXT MD VISIT: 06/27/2024  OBJECTIVE:  Note: Objective measures were completed at Evaluation unless otherwise noted.  DIAGNOSTIC FINDINGS:  X-ray of lumbar - 1. Mild age-indeterminate height loss of the T12 vertebral body. Correlate for point tenderness. 2. Multilevel degenerative disc and facet disease.    PATIENT SURVEYS:  Modified Oswestry:  MODIFIED OSWESTRY DISABILITY SCALE  Date: 06/18/2024 Score  Pain intensity 1 = The pain is bad, but I can manage without having to take pain medication  2. Personal care (washing, dressing, etc.) 0 =  I can take care of myself normally without causing increased pain.  3. Lifting 0 = I can lift heavy weights without increased pain.  4. Walking 0 = Pain does not prevent me from walking any distance  5. Sitting 1 =  I can only sit in my favorite chair as long as I like.  6. Standing 4 =  Pain prevents me from standing more than 10 minutes.  7. Sleeping 0 = Pain does not prevent me from sleeping well.  8. Social Life 1 =  My social life is normal, but it increases  my level of  pain.  9. Traveling 3 = My pain restricts my travel over 1 hour  10. Employment/ Homemaking 1 = My normal homemaking/job activities increase my pain, but I can still perform all that is required of me  Total 11/50   Interpretation of scores: Score Category Description  0-20% Minimal Disability The patient can cope with most living activities. Usually no treatment is indicated apart from advice on lifting, sitting and exercise  21-40% Moderate Disability The patient experiences more pain and difficulty with sitting, lifting and standing. Travel and social life are more difficult and they may be disabled from work. Personal care, sexual activity and sleeping are not grossly affected, and the patient can usually be managed by conservative means  41-60% Severe Disability Pain remains the main problem in this group, but activities of daily living are affected. These patients require a detailed investigation  61-80% Crippled Back pain impinges on all aspects of the patients life. Positive intervention is required  81-100% Bed-bound These patients are either bed-bound or exaggerating their symptoms  Bluford FORBES Zoe DELENA Karon DELENA, et al. Surgery versus conservative management of stable thoracolumbar fracture: the PRESTO feasibility RCT. [place] ([place]): [employer]; 2021 Nov. [facility] Technology Assessment, No. 25.62.) Appendix 3, Oswestry Disability Index category descriptors. Available from: Findjewelers.cz  Minimally Clinically Important Difference (MCID) = 12.8%  COGNITION: Overall cognitive status: Within functional limits for tasks assessed     SENSATION: Light touch: WFL  MUSCLE LENGTH: Hamstrings: Right WNL; Left WNL  POSTURE: rounded shoulders, decreased lumbar lordosis, and posterior pelvic tilt  PALPATION: Tenderness over B PSIS and L5. Hypertonicity over R lumbar and thoracic paraspinals.    LUMBAR ROM:   AROM eval  Flexion  WNL  Extension WNL  Right lateral flexion WNL  Left lateral flexion WNL  Right rotation WNL  Left rotation WNL   (Blank rows = not tested)  LOWER EXTREMITY ROM:     Active  Right eval Left eval  Hip flexion WNL WNL  Hip extension 5 5  Hip abduction WNL WNL  Hip adduction    Hip internal rotation WNL WNL  Hip external rotation WNL WNL  Knee flexion WNL WNL  Knee extension WNL WNL  Ankle dorsiflexion WNL WNL  Ankle plantarflexion WNL WNL  Ankle inversion    Ankle eversion     (Blank rows = not tested)  LOWER EXTREMITY MMT:    MMT Right eval Left eval  Hip flexion 5/5 5/5  Hip extension 4/5 4/5  Hip abduction 4+/5 4+/5  Hip adduction    Hip internal rotation    Hip external rotation    Knee flexion 5/5 5/5  Knee extension 5/5 5/5  Ankle dorsiflexion 5/5 5/5  Ankle plantarflexion    Ankle inversion    Ankle eversion     (Blank rows = not tested)  LUMBAR SPECIAL TESTS:  Straight leg raise test: Negative  FUNCTIONAL TESTS:  Functional squat - deferred SLS - deferred  TREATMENT DATE:  [facility] Adult PT Treatment:                                                DATE: 07/23/24 Therapeutic Exercise: Nustep level 5 x 5 mins Supine figure 4 piriformis stretch 2x30 SLR x10 BIL Neuromuscular Reeducation:  LTR on ball x1' Supine sciatic nerve glide x20 BIL Bridge with legs extended on  ball 2x10 DKTC with ball x 20 Supine ball press down 2x10 Therapeutic Activity: Standing hip abduction/extension 2x10 ea BIL Step ups 4 fwd/lat x10 ea BIL STS with OH reach x10   [facility] Adult PT Treatment:                                                DATE: 07/16/24 Therapeutic Exercise: Nustep level 5 x 5 mins Supine figure 4 piriformis stretch 2x30 Seated marching 2x30  SLR x10 BIL STS x10, with OH reach x10 Neuromuscular Reeducation:  LTR on ball 2x10 each  Supine sciatic nerve glide 2x10 BIL Bridge with legs extended on ball 2x10 DKTC with ball x 15 Supine ball press  down x 10  Seated hip hinge x 10, with 8lb DB x 10 - cues for form Seated ball roll outs x 10 each direction   [facility] Adult PT Treatment:                                                DATE: 07/02/2024  Neuromuscular Reeducation:  LTR on ball x 10 each  Bridge with legs extended on ball x 15 DKTC with ball x 15 Supine ball press down x 10  Supine mini crunch with foam roller x 10  Seated hip hinge x 10, with 8lb DB x 10  Seated ball roll outs x 10 each direction    PATIENT EDUCATION:  Education details: POC, HEP, diagnosis, prognosis.  Person educated: Patient and Child(ren) Education method: Explanation, Demonstration, and Handouts Education comprehension: verbalized understanding and needs further education  HOME EXERCISE PROGRAM: Access Code: [email or web address] URL: [email or web address] Date: 07/23/2024 Prepared by: Corean Pouch  Exercises - Supine Posterior Pelvic Tilt  - 1 x daily - 7 x weekly - 3 sets - 10 reps - 5sec hold - Clamshell  - 1 x daily - 7 x weekly - 3 sets - 10 reps - Supine Bridge  - 1 x daily - 7 x weekly - 3 sets - 10 reps - Supine Lower Trunk Rotation  - 1 x daily - 7 x weekly - 2 sets - 10 reps - Supine Figure 4 Piriformis Stretch  - 1 x daily - 7 x weekly - 3 sets - 30 seconds hold  ASSESSMENT:  CLINICAL IMPRESSION: Patient presents to PT late, truncated session appropriately, with daughter but no in person interpreter was present today so session was limited by language barrier. Incorporated more standing exercises today to good effect, no increase in pain, only muscular fatigue. Updated HEP today to include figure 4 piriformis stretch. Patient continues to benefit from skilled PT services and should be progressed as able to improve functional independence.   EVAL: Patient is a 54 y.o. female who was seen today for physical therapy evaluation and treatment for low back pain and B n/t. Pt demonstrates good lumbar and hip mobility, negative  sciatic nerve tension tests, and weakness of B hips. Pt also demonstrates poor core control as seen with bed mobility. Her functional limitations include prolonged standing, sleeping, and occupational tasks. The pt does have a history of DDD which is contributing to her symptoms. The pt will benefit from skilled physical therapy to return decrease pain and increase function.  OBJECTIVE IMPAIRMENTS: decreased activity tolerance, decreased endurance, decreased strength, improper body mechanics, postural dysfunction, and pain.   ACTIVITY LIMITATIONS: lifting and standing  PARTICIPATION LIMITATIONS: community activity and occupation  PERSONAL FACTORS: Fitness, Time since onset of injury/illness/exacerbation, and 3+ comorbidities: DM II, HTN, previous pelvic issues  are also affecting patient's functional outcome.   REHAB POTENTIAL: Fair considering personal factors listed above  CLINICAL DECISION MAKING: Evolving/moderate complexity  EVALUATION COMPLEXITY: Moderate   GOALS: Goals reviewed with patient? Yes  SHORT TERM GOALS: Target date: 07/16/2024  Pt will be compliant and independent with HEP to assist with symptom management/recovery at home.  Baseline: [id number] Goal status: MET Pt reports adherence 07/16/24  2.  Pt will report 25% or greater improvement since starting PT.  Baseline: 0% Goal status: INITIAL  3.  Pt will be able to stand for 30 minutes without increase in pain. Baseline: 10 mins Goal status: INITIAL   LONG TERM GOALS: Target date: 08/13/2024  Pt will score 8/50 or less on the MODI.  Baseline: 11/50 Goal status: INITIAL  2.  Pt will demonstrate 5-/5 or greater strength in muscles that are not 5/5.  Baseline:  MMT Right eval Left eval  Hip flexion 5/5 5/5  Hip extension 4/5 4/5  Hip abduction 4+/5 4+/5   Goal status: INITIAL  3.  Pt will report 50% less n/t in B LE's since start of PT.  Baseline: 0% Goal status: INITIAL  4.  Pt will be  comfortable with her final HEP in order to continue any symptom management at home and to avoid regression.   Baseline:  [id number] Goal status: INITIAL   PLAN:  PT FREQUENCY: 1-2x/week  PT DURATION: 8 weeks  PLANNED INTERVENTIONS: 97164- PT Re-evaluation, 97110-Therapeutic exercises, 97530- Therapeutic activity, 97112- Neuromuscular re-education, 97535- Self Care, [id number]- Manual therapy, [phone]- Aquatic Therapy, [phone]- Electrical stimulation (unattended), [phone]- Traction (mechanical), [phone] (1-2 muscles), [phone] (3+ muscles)- Dry Needling, Patient/Family education, Balance training, Spinal manipulation, Spinal mobilization, Cryotherapy, and Moist heat  For all possible CPT codes, reference the Planned Interventions line above.     Check all conditions that are expected to impact treatment: {Conditions expected to impact treatment:Diabetes mellitus and Social determinants of health   If treatment provided at initial evaluation, no treatment charged due to lack of authorization.       PLAN FOR NEXT SESSION: core stability, hip strengthening, lumbopelvic mobility, assess functional squat and SLS. HEP review.    Corean Pouch PTA  07/23/2024 9:14 AM  "

## 2024-07-29 ENCOUNTER — Other Ambulatory Visit: Payer: Self-pay

## 2024-07-30 ENCOUNTER — Ambulatory Visit

## 2024-07-30 DIAGNOSIS — M5459 Other low back pain: Secondary | ICD-10-CM | POA: Diagnosis not present

## 2024-07-30 DIAGNOSIS — M5431 Sciatica, right side: Secondary | ICD-10-CM

## 2024-07-30 DIAGNOSIS — M5432 Sciatica, left side: Secondary | ICD-10-CM

## 2024-07-30 NOTE — Therapy (Signed)
 " OUTPATIENT PHYSICAL THERAPY NOTE   Patient Name: Lori Sullivan MRN: [id number] DOB:12-26-1970, 54 y.o., female Today's Date: 07/30/2024  END OF SESSION:  PT End of Session - 07/30/24 0836     Visit Number 6    Number of Visits 8    Date for Recertification  08/13/24    Authorization Type [employer] next    Authorization Time Period approved 16 PT visits from 06/18/24-08/13/24    Authorization - Visit Number 6    Authorization - Number of Visits 16    PT Start Time [id number]    PT Stop Time 0915    PT Time Calculation (min) 38 min    Activity Tolerance Patient tolerated treatment well    Behavior During Therapy [facility] for tasks assessed/performed           Past Medical History:  Diagnosis Date   Anemia    Constipation    Diabetes mellitus without complication (HCC)    Fatigue    GERD (gastroesophageal reflux disease)    History of 2019 novel coronavirus disease (COVID-19) (08-13-2019  per pt only mild cough/ sore throat and resolved in 2 wks, no further symptoms   in epic ED visit 07-14-2019 ,pt exposed , test positive covid w/ mild dry cough and mild sore throat (no fever, sob, difficulty breathing, body aches, headache)   Hypertension 08-13-2019  in epic ED visit 07-14-2019 pt given norvasc  rx for HTN, per note she was to follow up with her pcp which she has not done due bleeding hemorrhoids,  pt stated last dose approx. 2 wks ago   no refills on amlodipine  per spouse   Internal hemorrhoid, bleeding    Mixed hyperlipidemia    RECTAL BLEEDING 05/28/2010   Qualifier: Diagnosis of  By: Genie CMA LEODIS), Chick     Past Surgical History:  Procedure Laterality Date   COLONOSCOPY  08-11-2019    DR HUNG  (PER DR WHITE H&P IN EPIC DATED 08-12-2019)   HEMORRHOID SURGERY N/A 09/04/2019   Procedure: HEMORRHOIDECTOMY;  Surgeon: Teresa Lonni HERO, MD;  Location: [facility];  Service: General;  Laterality: N/A;   RECTAL EXAM UNDER ANESTHESIA N/A 09/04/2019   Procedure:  ANORECTAL EXAM UNDER ANESTHESIA;  Surgeon: Teresa Lonni HERO, MD;  Location: [facility];  Service: General;  Laterality: N/A;   Patient Active Problem List   Diagnosis Date Noted   Headache 12/05/2023   Chronic idiopathic constipation 09/18/2023   Nocturia 09/18/2023   Numbness and tingling of both legs 05/10/2023   Toenail fungus 05/10/2023   Shoulder pain, right 03/28/2023   Hematochezia 02/21/2023   Prolapse of posterior vaginal wall 01/31/2023   Encounter for screening mammogram for breast cancer 04/11/2022   Sciatica of left side 07/27/2021   Iron deficiency anemia 07/27/2021   IT band syndrome 07/13/2021   Type 2 diabetes mellitus without complication, without long-term current use of insulin (HCC) 07/13/2021   Frequent urination 03/30/2020   Bilateral sciatica 03/28/2019   Mixed hyperlipidemia 07/12/2018   Thyromegaly 01/04/2015   Essential hypertension 12/05/2014   Internal and external bleeding hemorrhoids 02/20/2013   GERD 05/28/2010    PCP:   Lupie Credit, DO   REFERRING PROVIDER: Madelon Donald HERO, DO  REFERRING DIAG: [phone] (ICD-10-CM) - Degeneration of intervertebral disc of lumbar region with discogenic back pain and lower extremity pain   Rationale for Evaluation and Treatment: Rehabilitation  THERAPY DIAG:  Other low back pain  Sciatica, left side  Sciatica, right  side  ONSET DATE: 2 years ago  SUBJECTIVE:                                                                                                                                                                                           SUBJECTIVE STATEMENT:  07/30/2024 Patient reports PT is helping her pain but she does continue to have some mild R low back pain.   Pt accompanied daughter, no in person interpreter today, session limited by language barrier.  EVAL: Pt reports her pain began randomly about 2 years ago and has slightly worsened. She notes pain in the center of  the low back and radiating pain plus n/t down both legs. The radiating symptoms are reported as random as well. Sitting is better than standing and no other activities are mentioned as pain inducing.  PERTINENT HISTORY:  DM II, HTN, previous pelvic issues   PAIN:  Are you having pain? Yes: NPRS scale: did not answer Pain location: low back and B legs Pain description: ache, tingling, numbness, tension Aggravating factors: standing Relieving factors: sitting  PRECAUTIONS: None  RED FLAGS: None   WEIGHT BEARING RESTRICTIONS: No  FALLS:  Has patient fallen in last 6 months? No  LIVING ENVIRONMENT: Lives with: lives with their family Lives in: House/apartment Stairs: Yes: External: 3 steps; can reach both Has following equipment at home: None  OCCUPATION: nail technician   PLOF: Independent  PATIENT GOALS: less pain  NEXT MD VISIT: 06/27/2024  OBJECTIVE:  Note: Objective measures were completed at Evaluation unless otherwise noted.  DIAGNOSTIC FINDINGS:  X-ray of lumbar - 1. Mild age-indeterminate height loss of the T12 vertebral body. Correlate for point tenderness. 2. Multilevel degenerative disc and facet disease.    PATIENT SURVEYS:  Modified Oswestry:  MODIFIED OSWESTRY DISABILITY SCALE  Date: 06/18/2024 Score  Pain intensity 1 = The pain is bad, but I can manage without having to take pain medication  2. Personal care (washing, dressing, etc.) 0 =  I can take care of myself normally without causing increased pain.  3. Lifting 0 = I can lift heavy weights without increased pain.  4. Walking 0 = Pain does not prevent me from walking any distance  5. Sitting 1 =  I can only sit in my favorite chair as long as I like.  6. Standing 4 =  Pain prevents me from standing more than 10 minutes.  7. Sleeping 0 = Pain does not prevent me from sleeping well.  8. Social Life 1 =  My social life is normal, but it increases my level of pain.  9. Traveling 3 = My  pain  restricts my travel over 1 hour  10. Employment/ Homemaking 1 = My normal homemaking/job activities increase my pain, but I can still perform all that is required of me  Total 11/50   Interpretation of scores: Score Category Description  0-20% Minimal Disability The patient can cope with most living activities. Usually no treatment is indicated apart from advice on lifting, sitting and exercise  21-40% Moderate Disability The patient experiences more pain and difficulty with sitting, lifting and standing. Travel and social life are more difficult and they may be disabled from work. Personal care, sexual activity and sleeping are not grossly affected, and the patient can usually be managed by conservative means  41-60% Severe Disability Pain remains the main problem in this group, but activities of daily living are affected. These patients require a detailed investigation  61-80% Crippled Back pain impinges on all aspects of the patients life. Positive intervention is required  81-100% Bed-bound These patients are either bed-bound or exaggerating their symptoms  Bluford FORBES Zoe DELENA Karon DELENA, et al. Surgery versus conservative management of stable thoracolumbar fracture: the PRESTO feasibility RCT. [place] ([place]): [employer]; 2021 Nov. [facility] Technology Assessment, No. 25.62.) Appendix 3, Oswestry Disability Index category descriptors. Available from: [email or web address].cz  Minimally Clinically Important Difference (MCID) = 12.8%  COGNITION: Overall cognitive status: Within functional limits for tasks assessed     SENSATION: Light touch: WFL  MUSCLE LENGTH: Hamstrings: Right WNL; Left WNL  POSTURE: rounded shoulders, decreased lumbar lordosis, and posterior pelvic tilt  PALPATION: Tenderness over B PSIS and L5. Hypertonicity over R lumbar and thoracic paraspinals.    LUMBAR ROM:   AROM eval  Flexion WNL  Extension WNL  Right lateral flexion WNL   Left lateral flexion WNL  Right rotation WNL  Left rotation WNL   (Blank rows = not tested)  LOWER EXTREMITY ROM:     Active  Right eval Left eval  Hip flexion WNL WNL  Hip extension 5 5  Hip abduction WNL WNL  Hip adduction    Hip internal rotation WNL WNL  Hip external rotation WNL WNL  Knee flexion WNL WNL  Knee extension WNL WNL  Ankle dorsiflexion WNL WNL  Ankle plantarflexion WNL WNL  Ankle inversion    Ankle eversion     (Blank rows = not tested)  LOWER EXTREMITY MMT:    MMT Right eval Left eval  Hip flexion 5/5 5/5  Hip extension 4/5 4/5  Hip abduction 4+/5 4+/5  Hip adduction    Hip internal rotation    Hip external rotation    Knee flexion 5/5 5/5  Knee extension 5/5 5/5  Ankle dorsiflexion 5/5 5/5  Ankle plantarflexion    Ankle inversion    Ankle eversion     (Blank rows = not tested)  LUMBAR SPECIAL TESTS:  Straight leg raise test: Negative  FUNCTIONAL TESTS:  Functional squat - deferred SLS - deferred  TREATMENT DATE:  [facility] Adult PT Treatment:                                                DATE: 07/30/24 Therapeutic Exercise: Nustep level 5 x 6 mins SLR 2x10 BIL Neuromuscular Reeducation:  LTR on ball x1' Seated sciatic nerve glide x20 BIL Bridge with legs extended on ball 2x10 Supine ball press down 2x10 Paloff press RTB  Therapeutic  Activity: Standing hip abduction/extension YTB x10 ea BIL Step ups with knee drive 6 k89 ea BIL STS with 5# DB OH reach x10   [facility] Adult PT Treatment:                                                DATE: 07/16/24 Therapeutic Exercise: Nustep level 5 x 5 mins Supine figure 4 piriformis stretch 2x30 Seated marching 2x30  SLR x10 BIL STS x10, with OH reach x10 Neuromuscular Reeducation:  LTR on ball 2x10 each  Supine sciatic nerve glide 2x10 BIL Bridge with legs extended on ball 2x10 DKTC with ball x 15 Supine ball press down x 10  Seated hip hinge x 10, with 8lb DB x 10 - cues for  form Seated ball roll outs x 10 each direction   [facility] Adult PT Treatment:                                                DATE: 07/02/2024  Neuromuscular Reeducation:  LTR on ball x 10 each  Bridge with legs extended on ball x 15 DKTC with ball x 15 Supine ball press down x 10  Supine mini crunch with foam roller x 10  Seated hip hinge x 10, with 8lb DB x 10  Seated ball roll outs x 10 each direction    PATIENT EDUCATION:  Education details: POC, HEP, diagnosis, prognosis.  Person educated: Patient and Child(ren) Education method: Explanation, Demonstration, and Handouts Education comprehension: verbalized understanding and needs further education  HOME EXERCISE PROGRAM: Access Code: [email or web address] URL: [email or web address] Date: 07/23/2024 Prepared by: Corean Pouch  Exercises - Supine Posterior Pelvic Tilt  - 1 x daily - 7 x weekly - 3 sets - 10 reps - 5sec hold - Clamshell  - 1 x daily - 7 x weekly - 3 sets - 10 reps - Supine Bridge  - 1 x daily - 7 x weekly - 3 sets - 10 reps - Supine Lower Trunk Rotation  - 1 x daily - 7 x weekly - 2 sets - 10 reps - Supine Figure 4 Piriformis Stretch  - 1 x daily - 7 x weekly - 3 sets - 30 seconds hold  ASSESSMENT:  CLINICAL IMPRESSION: Patient presents to PT late, truncated session appropriately, with daughter but no in person interpreter was present today so session was limited by language barrier. Advanced pt appropriately with paloff press and band for hip strengthening. Pt noting no pain, just fatigue. Patient continues to benefit from skilled PT services and should be progressed as able to improve functional independence.   EVAL: Patient is a 54 y.o. female who was seen today for physical therapy evaluation and treatment for low back pain and B n/t. Pt demonstrates good lumbar and hip mobility, negative sciatic nerve tension tests, and weakness of B hips. Pt also demonstrates poor core control as seen with bed mobility.  Her functional limitations include prolonged standing, sleeping, and occupational tasks. The pt does have a history of DDD which is contributing to her symptoms. The pt will benefit from skilled physical therapy to return decrease pain and increase function.    OBJECTIVE IMPAIRMENTS: decreased activity tolerance, decreased endurance, decreased strength, improper body mechanics,  postural dysfunction, and pain.   ACTIVITY LIMITATIONS: lifting and standing  PARTICIPATION LIMITATIONS: community activity and occupation  PERSONAL FACTORS: Fitness, Time since onset of injury/illness/exacerbation, and 3+ comorbidities: DM II, HTN, previous pelvic issues  are also affecting patient's functional outcome.   REHAB POTENTIAL: Fair considering personal factors listed above  CLINICAL DECISION MAKING: Evolving/moderate complexity  EVALUATION COMPLEXITY: Moderate   GOALS: Goals reviewed with patient? Yes  SHORT TERM GOALS: Target date: 07/16/2024  Pt will be compliant and independent with HEP to assist with symptom management/recovery at home.  Baseline: [id number] Goal status: MET Pt reports adherence 07/16/24  2.  Pt will report 25% or greater improvement since starting PT.  Baseline: 0% Goal status: INITIAL  3.  Pt will be able to stand for 30 minutes without increase in pain. Baseline: 10 mins Goal status: INITIAL   LONG TERM GOALS: Target date: 08/13/2024  Pt will score 8/50 or less on the MODI.  Baseline: 11/50 Goal status: INITIAL  2.  Pt will demonstrate 5-/5 or greater strength in muscles that are not 5/5.  Baseline:  MMT Right eval Left eval  Hip flexion 5/5 5/5  Hip extension 4/5 4/5  Hip abduction 4+/5 4+/5   Goal status: INITIAL  3.  Pt will report 50% less n/t in B LE's since start of PT.  Baseline: 0% Goal status: INITIAL  4.  Pt will be comfortable with her final HEP in order to continue any symptom management at home and to avoid regression.   Baseline:   [id number] Goal status: INITIAL   PLAN:  PT FREQUENCY: 1-2x/week  PT DURATION: 8 weeks  PLANNED INTERVENTIONS: 97164- PT Re-evaluation, 97110-Therapeutic exercises, 97530- Therapeutic activity, 97112- Neuromuscular re-education, 97535- Self Care, [id number]- Manual therapy, [phone]- Aquatic Therapy, [phone]- Electrical stimulation (unattended), [phone]- Traction (mechanical), [phone] (1-2 muscles), [id number] (3+ muscles)- Dry Needling, Patient/Family education, Balance training, Spinal manipulation, Spinal mobilization, Cryotherapy, and Moist heat  For all possible CPT codes, reference the Planned Interventions line above.     Check all conditions that are expected to impact treatment: {Conditions expected to impact treatment:Diabetes mellitus and Social determinants of health   If treatment provided at initial evaluation, no treatment charged due to lack of authorization.       PLAN FOR NEXT SESSION: core stability, hip strengthening, lumbopelvic mobility, assess functional squat and SLS. HEP review.    Marijo DELENA Berber PT  07/30/2024 9:16 AM  "

## 2024-07-31 ENCOUNTER — Other Ambulatory Visit (HOSPITAL_COMMUNITY): Payer: Self-pay

## 2024-07-31 ENCOUNTER — Other Ambulatory Visit: Payer: Self-pay

## 2024-07-31 DIAGNOSIS — E119 Type 2 diabetes mellitus without complications: Secondary | ICD-10-CM

## 2024-07-31 DIAGNOSIS — K219 Gastro-esophageal reflux disease without esophagitis: Secondary | ICD-10-CM

## 2024-08-01 MED ORDER — METFORMIN HCL ER 500 MG PO TB24
500.0000 mg | ORAL_TABLET | Freq: Every day | ORAL | 2 refills | Status: AC
  Filled 2024-08-01: qty 60, 60d supply, fill #0

## 2024-08-01 MED ORDER — OMEPRAZOLE 40 MG PO CPDR
40.0000 mg | DELAYED_RELEASE_CAPSULE | Freq: Every day | ORAL | 3 refills | Status: AC
  Filled 2024-08-01: qty 30, 30d supply, fill #0

## 2024-08-02 ENCOUNTER — Other Ambulatory Visit (HOSPITAL_COMMUNITY): Payer: Self-pay

## 2024-08-06 ENCOUNTER — Ambulatory Visit

## 2024-08-06 DIAGNOSIS — M5459 Other low back pain: Secondary | ICD-10-CM

## 2024-08-06 DIAGNOSIS — M5432 Sciatica, left side: Secondary | ICD-10-CM

## 2024-08-06 DIAGNOSIS — M5431 Sciatica, right side: Secondary | ICD-10-CM

## 2024-08-06 NOTE — Therapy (Signed)
 " OUTPATIENT PHYSICAL THERAPY NOTE   Patient Name: Lori Sullivan MRN: [id number] DOB:11-Jun-1971, 54 y.o., female Today's Date: 08/06/2024  END OF SESSION:  PT End of Session - 08/06/24 0847     Visit Number 7    Number of Visits 8    Date for Recertification  08/13/24    Authorization Type [employer] next    Authorization Time Period approved 16 PT visits from 06/18/24-08/13/24    Authorization - Visit Number 7    Authorization - Number of Visits 16    PT Start Time 0840    PT Stop Time 0915    PT Time Calculation (min) 35 min    Activity Tolerance Patient tolerated treatment well    Behavior During Therapy [facility] for tasks assessed/performed            Past Medical History:  Diagnosis Date   Anemia    Constipation    Diabetes mellitus without complication (HCC)    Fatigue    GERD (gastroesophageal reflux disease)    History of 2019 novel coronavirus disease (COVID-19) (08-13-2019  per pt only mild cough/ sore throat and resolved in 2 wks, no further symptoms   in epic ED visit 07-14-2019 ,pt exposed , test positive covid w/ mild dry cough and mild sore throat (no fever, sob, difficulty breathing, body aches, headache)   Hypertension 08-13-2019  in epic ED visit 07-14-2019 pt given norvasc  rx for HTN, per note she was to follow up with her pcp which she has not done due bleeding hemorrhoids,  pt stated last dose approx. 2 wks ago   no refills on amlodipine  per spouse   Internal hemorrhoid, bleeding    Mixed hyperlipidemia    RECTAL BLEEDING 05/28/2010   Qualifier: Diagnosis of  By: Genie CMA LEODIS), Chick     Past Surgical History:  Procedure Laterality Date   COLONOSCOPY  08-11-2019    DR HUNG  (PER DR WHITE H&P IN EPIC DATED 08-12-2019)   HEMORRHOID SURGERY N/A 09/04/2019   Procedure: HEMORRHOIDECTOMY;  Surgeon: Teresa Lonni HERO, MD;  Location: [facility] [place];  Service: General;  Laterality: N/A;   RECTAL EXAM UNDER ANESTHESIA N/A 09/04/2019   Procedure:  ANORECTAL EXAM UNDER ANESTHESIA;  Surgeon: Teresa Lonni HERO, MD;  Location: [facility] [place];  Service: General;  Laterality: N/A;   Patient Active Problem List   Diagnosis Date Noted   Headache 12/05/2023   Chronic idiopathic constipation 09/18/2023   Nocturia 09/18/2023   Numbness and tingling of both legs 05/10/2023   Toenail fungus 05/10/2023   Shoulder pain, right 03/28/2023   Hematochezia 02/21/2023   Prolapse of posterior vaginal wall 01/31/2023   Encounter for screening mammogram for breast cancer 04/11/2022   Sciatica of left side 07/27/2021   Iron deficiency anemia 07/27/2021   IT band syndrome 07/13/2021   Type 2 diabetes mellitus without complication, without long-term current use of insulin (HCC) 07/13/2021   Frequent urination 03/30/2020   Bilateral sciatica 03/28/2019   Mixed hyperlipidemia 07/12/2018   Thyromegaly 01/04/2015   Essential hypertension 12/05/2014   Internal and external bleeding hemorrhoids 02/20/2013   GERD 05/28/2010    PCP:   Lupie Credit, DO   REFERRING PROVIDER: Madelon Donald HERO, DO  REFERRING DIAG: [phone] (ICD-10-CM) - Degeneration of intervertebral disc of lumbar region with discogenic back pain and lower extremity pain   Rationale for Evaluation and Treatment: Rehabilitation  THERAPY DIAG:  Other low back pain  Sciatica, left side  Sciatica,  right side  ONSET DATE: 2 years ago  SUBJECTIVE:                                                                                                                                                                                           SUBJECTIVE STATEMENT:  08/06/2024 Patient reports PT is helping her pain but she does continue to have some mild R low back pain.   Pt accompanied daughter, no in person interpreter today, session limited by language barrier.  EVAL: Pt reports her pain began randomly about 2 years ago and has slightly worsened. She notes pain in the center of  the low back and radiating pain plus n/t down both legs. The radiating symptoms are reported as random as well. Sitting is better than standing and no other activities are mentioned as pain inducing.  PERTINENT HISTORY:  DM II, HTN, previous pelvic issues   PAIN:  Are you having pain? Yes: NPRS scale: did not answer Pain location: low back and B legs Pain description: ache, tingling, numbness, tension Aggravating factors: standing Relieving factors: sitting  PRECAUTIONS: None  RED FLAGS: None   WEIGHT BEARING RESTRICTIONS: No  FALLS:  Has patient fallen in last 6 months? No  LIVING ENVIRONMENT: Lives with: lives with their family Lives in: House/apartment Stairs: Yes: External: 3 steps; can reach both Has following equipment at home: None  OCCUPATION: nail technician   PLOF: Independent  PATIENT GOALS: less pain  NEXT MD VISIT: 06/27/2024  OBJECTIVE:  Note: Objective measures were completed at Evaluation unless otherwise noted.  DIAGNOSTIC FINDINGS:  X-ray of lumbar - 1. Mild age-indeterminate height loss of the T12 vertebral body. Correlate for point tenderness. 2. Multilevel degenerative disc and facet disease.    PATIENT SURVEYS:  Modified Oswestry:  MODIFIED OSWESTRY DISABILITY SCALE  Date: 06/18/2024 Score  Pain intensity 1 = The pain is bad, but I can manage without having to take pain medication  2. Personal care (washing, dressing, etc.) 0 =  I can take care of myself normally without causing increased pain.  3. Lifting 0 = I can lift heavy weights without increased pain.  4. Walking 0 = Pain does not prevent me from walking any distance  5. Sitting 1 =  I can only sit in my favorite chair as long as I like.  6. Standing 4 =  Pain prevents me from standing more than 10 minutes.  7. Sleeping 0 = Pain does not prevent me from sleeping well.  8. Social Life 1 =  My social life is normal, but it increases my level of pain.  9. Traveling 3 =  My pain  restricts my travel over 1 hour  10. Employment/ Homemaking 1 = My normal homemaking/job activities increase my pain, but I can still perform all that is required of me  Total 11/50   Interpretation of scores: Score Category Description  0-20% Minimal Disability The patient can cope with most living activities. Usually no treatment is indicated apart from advice on lifting, sitting and exercise  21-40% Moderate Disability The patient experiences more pain and difficulty with sitting, lifting and standing. Travel and social life are more difficult and they may be disabled from work. Personal care, sexual activity and sleeping are not grossly affected, and the patient can usually be managed by conservative means  41-60% Severe Disability Pain remains the main problem in this group, but activities of daily living are affected. These patients require a detailed investigation  61-80% Crippled Back pain impinges on all aspects of the patients life. Positive intervention is required  81-100% Bed-bound These patients are either bed-bound or exaggerating their symptoms  Bluford FORBES Zoe DELENA Karon DELENA, et al. Surgery versus conservative management of stable thoracolumbar fracture: the PRESTO feasibility RCT. [place] ([place]): [employer]; 2021 Nov. [facility] Technology Assessment, No. 25.62.) Appendix 3, Oswestry Disability Index category descriptors. Available from: Findjewelers.cz  Minimally Clinically Important Difference (MCID) = 12.8%  COGNITION: Overall cognitive status: Within functional limits for tasks assessed     SENSATION: Light touch: WFL  MUSCLE LENGTH: Hamstrings: Right WNL; Left WNL  POSTURE: rounded shoulders, decreased lumbar lordosis, and posterior pelvic tilt  PALPATION: Tenderness over B PSIS and L5. Hypertonicity over R lumbar and thoracic paraspinals.    LUMBAR ROM:   AROM eval  Flexion WNL  Extension WNL  Right lateral flexion WNL   Left lateral flexion WNL  Right rotation WNL  Left rotation WNL   (Blank rows = not tested)  LOWER EXTREMITY ROM:     Active  Right eval Left eval  Hip flexion WNL WNL  Hip extension 5 5  Hip abduction WNL WNL  Hip adduction    Hip internal rotation WNL WNL  Hip external rotation WNL WNL  Knee flexion WNL WNL  Knee extension WNL WNL  Ankle dorsiflexion WNL WNL  Ankle plantarflexion WNL WNL  Ankle inversion    Ankle eversion     (Blank rows = not tested)  LOWER EXTREMITY MMT:    MMT Right eval Left eval  Hip flexion 5/5 5/5  Hip extension 4/5 4/5  Hip abduction 4+/5 4+/5  Hip adduction    Hip internal rotation    Hip external rotation    Knee flexion 5/5 5/5  Knee extension 5/5 5/5  Ankle dorsiflexion 5/5 5/5  Ankle plantarflexion    Ankle inversion    Ankle eversion     (Blank rows = not tested)  LUMBAR SPECIAL TESTS:  Straight leg raise test: Negative  FUNCTIONAL TESTS:  Functional squat - deferred SLS - deferred  TREATMENT DATE:  [facility] Adult PT Treatment:                                                DATE: 08/06/2024 Therapeutic Exercise: Treadmill 3% incline @ 1.43mph x 6 mins  Neuromuscular Reeducation:  Seated sciatic nerve glide x20 BIL Seated ball press down forward and sides 2x10 each  Paloff press RTB x 10 B  Therapeutic Activity: Side  stepping RTB x 4 laps at counter  Step ups with knee drive 8 k89 ea BIL STS with 6# DB OH reach 2x10   [facility] Adult PT Treatment:                                                DATE: 07/16/24 Therapeutic Exercise: Nustep level 5 x 5 mins Supine figure 4 piriformis stretch 2x30 Seated marching 2x30  SLR x10 BIL STS x10, with OH reach x10 Neuromuscular Reeducation:  LTR on ball 2x10 each  Supine sciatic nerve glide 2x10 BIL Bridge with legs extended on ball 2x10 DKTC with ball x 15 Supine ball press down x 10  Seated hip hinge x 10, with 8lb DB x 10 - cues for form Seated ball roll outs x 10 each  direction   [facility] Adult PT Treatment:                                                DATE: 07/02/2024  Neuromuscular Reeducation:  LTR on ball x 10 each  Bridge with legs extended on ball x 15 DKTC with ball x 15 Supine ball press down x 10  Supine mini crunch with foam roller x 10  Seated hip hinge x 10, with 8lb DB x 10  Seated ball roll outs x 10 each direction    PATIENT EDUCATION:  Education details: POC, HEP, diagnosis, prognosis.  Person educated: Patient and Child(ren) Education method: Explanation, Demonstration, and Handouts Education comprehension: verbalized understanding and needs further education  HOME EXERCISE PROGRAM: Access Code: [email or web address] URL: [email or web address] Date: 07/23/2024 Prepared by: Corean Pouch  Exercises - Supine Posterior Pelvic Tilt  - 1 x daily - 7 x weekly - 3 sets - 10 reps - 5sec hold - Clamshell  - 1 x daily - 7 x weekly - 3 sets - 10 reps - Supine Bridge  - 1 x daily - 7 x weekly - 3 sets - 10 reps - Supine Lower Trunk Rotation  - 1 x daily - 7 x weekly - 2 sets - 10 reps - Supine Figure 4 Piriformis Stretch  - 1 x daily - 7 x weekly - 3 sets - 30 seconds hold  ASSESSMENT:  CLINICAL IMPRESSION: Patient presents to PT late, truncated session appropriately, with in person interpreter was present today. Continued with functional based exercises targeting core and LE's. Pt noting no pain, just fatigue.  Patient continues to benefit from skilled PT services and should be progressed as able to improve functional independence.   EVAL: Patient is a 54 y.o. female who was seen today for physical therapy evaluation and treatment for low back pain and B n/t. Pt demonstrates good lumbar and hip mobility, negative sciatic nerve tension tests, and weakness of B hips. Pt also demonstrates poor core control as seen with bed mobility. Her functional limitations include prolonged standing, sleeping, and occupational tasks. The pt does  have a history of DDD which is contributing to her symptoms. The pt will benefit from skilled physical therapy to return decrease pain and increase function.    OBJECTIVE IMPAIRMENTS: decreased activity tolerance, decreased endurance, decreased strength, improper body mechanics, postural dysfunction, and pain.   ACTIVITY LIMITATIONS: lifting and standing  PARTICIPATION LIMITATIONS: community activity and occupation  PERSONAL FACTORS: Fitness, Time since onset of injury/illness/exacerbation, and 3+ comorbidities: DM II, HTN, previous pelvic issues  are also affecting patient's functional outcome.   REHAB POTENTIAL: Fair considering personal factors listed above  CLINICAL DECISION MAKING: Evolving/moderate complexity  EVALUATION COMPLEXITY: Moderate   GOALS: Goals reviewed with patient? Yes  SHORT TERM GOALS: Target date: 07/16/2024  Pt will be compliant and independent with HEP to assist with symptom management/recovery at home.  Baseline: [id number] Goal status: MET Pt reports adherence 07/16/24  2.  Pt will report 25% or greater improvement since starting PT.  Baseline: 0% Goal status: INITIAL  3.  Pt will be able to stand for 30 minutes without increase in pain. Baseline: 10 mins Goal status: INITIAL   LONG TERM GOALS: Target date: 08/13/2024  Pt will score 8/50 or less on the MODI.  Baseline: 11/50 Goal status: INITIAL  2.  Pt will demonstrate 5-/5 or greater strength in muscles that are not 5/5.  Baseline:  MMT Right eval Left eval  Hip flexion 5/5 5/5  Hip extension 4/5 4/5  Hip abduction 4+/5 4+/5   Goal status: INITIAL  3.  Pt will report 50% less n/t in B LE's since start of PT.  Baseline: 0% Goal status: INITIAL  4.  Pt will be comfortable with her final HEP in order to continue any symptom management at home and to avoid regression.   Baseline:  [id number] Goal status: INITIAL   PLAN:  PT FREQUENCY: 1-2x/week  PT DURATION: 8 weeks  PLANNED  INTERVENTIONS: 97164- PT Re-evaluation, 97110-Therapeutic exercises, 97530- Therapeutic activity, 97112- Neuromuscular re-education, 97535- Self Care, [id number]- Manual therapy, [phone]- Aquatic Therapy, [phone]- Electrical stimulation (unattended), [phone]- Traction (mechanical), [phone] (1-2 muscles), [id number] (3+ muscles)- Dry Needling, Patient/Family education, Balance training, Spinal manipulation, Spinal mobilization, Cryotherapy, and Moist heat  For all possible CPT codes, reference the Planned Interventions line above.     Check all conditions that are expected to impact treatment: {Conditions expected to impact treatment:Diabetes mellitus and Social determinants of health   If treatment provided at initial evaluation, no treatment charged due to lack of authorization.       PLAN FOR NEXT SESSION: core stability, hip strengthening, lumbopelvic mobility, assess functional squat and SLS. HEP review.    Marijo DELENA Berber PT  08/06/2024 9:32 AM  "

## 2024-08-13 ENCOUNTER — Ambulatory Visit

## 2024-08-26 ENCOUNTER — Ambulatory Visit: Payer: Self-pay
